# Patient Record
Sex: Female | Born: 1945 | Race: Black or African American | Hispanic: No | Marital: Married | Smoking: Never smoker
Health system: Southern US, Community
[De-identification: ages and names within clinical notes are randomized; demographics above are authoritative.]

## PROBLEM LIST (undated history)

## (undated) DIAGNOSIS — I639 Cerebral infarction, unspecified: Secondary | ICD-10-CM

## (undated) DIAGNOSIS — I10 Essential (primary) hypertension: Secondary | ICD-10-CM

## (undated) DIAGNOSIS — E785 Hyperlipidemia, unspecified: Secondary | ICD-10-CM

## (undated) DIAGNOSIS — E213 Hyperparathyroidism, unspecified: Secondary | ICD-10-CM

## (undated) DIAGNOSIS — F0390 Unspecified dementia without behavioral disturbance: Secondary | ICD-10-CM

## (undated) HISTORY — DX: Unspecified dementia without behavioral disturbance: F03.90

## (undated) HISTORY — PX: CATARACT EXTRACTION: SUR2

## (undated) HISTORY — DX: Essential (primary) hypertension: I10

## (undated) HISTORY — DX: Hyperlipidemia, unspecified: E78.5

## (undated) HISTORY — DX: Hyperparathyroidism, unspecified: E21.3

---

## 1989-07-06 HISTORY — PX: ANKLE SURGERY: SHX546

## 2004-05-20 LAB — HM COLONOSCOPY: HM Colonoscopy: NORMAL

## 2005-11-23 ENCOUNTER — Encounter: Payer: Self-pay | Admitting: Internal Medicine

## 2006-04-05 ENCOUNTER — Ambulatory Visit: Payer: Self-pay | Admitting: Internal Medicine

## 2006-05-17 ENCOUNTER — Ambulatory Visit: Payer: Self-pay | Admitting: Internal Medicine

## 2006-10-08 ENCOUNTER — Ambulatory Visit: Payer: Self-pay | Admitting: Internal Medicine

## 2006-10-08 LAB — CONVERTED CEMR LAB
AST: 27 units/L (ref 0–37)
Albumin: 3.7 g/dL (ref 3.5–5.2)
Alkaline Phosphatase: 48 units/L (ref 39–117)
HDL: 56.1 mg/dL (ref >39.0)
Microalb Creat Ratio: 64.6 mg/g — ABNORMAL HIGH (ref 0.0–30.0)
Total CHOL/HDL Ratio: 2.3

## 2007-05-27 ENCOUNTER — Telehealth: Payer: Self-pay | Admitting: Internal Medicine

## 2007-06-06 ENCOUNTER — Ambulatory Visit: Payer: Self-pay | Admitting: Internal Medicine

## 2007-06-06 LAB — CONVERTED CEMR LAB
ALT: 17 units/L (ref 0–35)
Albumin: 4 g/dL (ref 3.5–5.2)
Alkaline Phosphatase: 48 units/L (ref 39–117)
BUN: 10 mg/dL (ref 6–23)
Bilirubin Urine: NEGATIVE
Calcium: 10.1 mg/dL (ref 8.4–10.5)
Creatinine, Ser: 0.9 mg/dL (ref 0.4–1.2)
Glucose, Bld: 92 mg/dL (ref 70–99)
HCT: 40.1 % (ref 36.0–46.0)
HDL: 54.8 mg/dL (ref >39.0)
Hgb A1c MFr Bld: 5.6 % (ref 4.6–6.0)
Ketones, ur: NEGATIVE mg/dL
LDL Cholesterol: 87 mg/dL (ref 0–99)
MCHC: 33.8 g/dL (ref 30.0–36.0)
Neutrophils Relative %: 55 % (ref 43.0–77.0)
Nitrite: NEGATIVE
Potassium: 3.6 meq/L (ref 3.5–5.1)
TSH: 1.03 microintl units/mL (ref 0.35–5.50)
Total Bilirubin: 1.3 mg/dL — ABNORMAL HIGH (ref 0.3–1.2)
Total Protein: 7.3 g/dL (ref 6.0–8.3)
Triglycerides: 63 mg/dL (ref 0–149)
Urine Glucose: NEGATIVE mg/dL
VLDL: 13 mg/dL (ref 0–40)

## 2007-06-09 ENCOUNTER — Encounter: Payer: Self-pay | Admitting: Internal Medicine

## 2007-06-09 DIAGNOSIS — E785 Hyperlipidemia, unspecified: Secondary | ICD-10-CM | POA: Insufficient documentation

## 2007-06-09 DIAGNOSIS — I10 Essential (primary) hypertension: Secondary | ICD-10-CM

## 2007-06-10 ENCOUNTER — Ambulatory Visit: Payer: Self-pay | Admitting: Internal Medicine

## 2007-06-10 DIAGNOSIS — M79609 Pain in unspecified limb: Secondary | ICD-10-CM

## 2007-06-20 ENCOUNTER — Ambulatory Visit: Payer: Self-pay | Admitting: Internal Medicine

## 2007-06-20 ENCOUNTER — Encounter: Payer: Self-pay | Admitting: Internal Medicine

## 2007-07-26 ENCOUNTER — Encounter: Payer: Self-pay | Admitting: Internal Medicine

## 2007-10-24 ENCOUNTER — Ambulatory Visit: Payer: Self-pay | Admitting: Internal Medicine

## 2007-12-07 ENCOUNTER — Ambulatory Visit: Payer: Self-pay | Admitting: Internal Medicine

## 2007-12-07 DIAGNOSIS — R413 Other amnesia: Secondary | ICD-10-CM

## 2007-12-07 DIAGNOSIS — H43399 Other vitreous opacities, unspecified eye: Secondary | ICD-10-CM | POA: Insufficient documentation

## 2008-05-23 ENCOUNTER — Encounter: Payer: Self-pay | Admitting: Internal Medicine

## 2008-07-09 ENCOUNTER — Telehealth (INDEPENDENT_AMBULATORY_CARE_PROVIDER_SITE_OTHER): Payer: Self-pay | Admitting: *Deleted

## 2008-08-03 ENCOUNTER — Ambulatory Visit: Payer: Self-pay | Admitting: Internal Medicine

## 2008-08-03 LAB — CONVERTED CEMR LAB
BUN: 10 mg/dL (ref 6–23)
CO2: 33 meq/L — ABNORMAL HIGH (ref 19–32)
Creatinine, Ser: 1 mg/dL (ref 0.4–1.2)
Creatinine,U: 135.4 mg/dL
GFR calc Af Amer: 72 mL/min
GFR calc non Af Amer: 60 mL/min
Microalb Creat Ratio: 5.9 mg/g (ref 0.0–30.0)

## 2008-08-07 ENCOUNTER — Ambulatory Visit: Payer: Self-pay | Admitting: Internal Medicine

## 2008-08-07 DIAGNOSIS — M949 Disorder of cartilage, unspecified: Secondary | ICD-10-CM

## 2008-08-07 DIAGNOSIS — M899 Disorder of bone, unspecified: Secondary | ICD-10-CM | POA: Insufficient documentation

## 2008-08-07 LAB — HM DIABETES FOOT EXAM

## 2008-09-14 ENCOUNTER — Ambulatory Visit: Payer: Self-pay | Admitting: *Deleted

## 2008-09-14 DIAGNOSIS — S139XXA Sprain of joints and ligaments of unspecified parts of neck, initial encounter: Secondary | ICD-10-CM | POA: Insufficient documentation

## 2009-01-30 ENCOUNTER — Ambulatory Visit: Payer: Self-pay | Admitting: Internal Medicine

## 2009-02-04 ENCOUNTER — Ambulatory Visit: Payer: Self-pay | Admitting: Internal Medicine

## 2009-02-04 DIAGNOSIS — E119 Type 2 diabetes mellitus without complications: Secondary | ICD-10-CM

## 2009-02-27 LAB — CONVERTED CEMR LAB
ALT: 32 units/L (ref 0–35)
CO2: 27 meq/L (ref 19–32)
Chloride: 104 meq/L (ref 96–112)
Cholesterol: 184 mg/dL (ref 0–200)
Creatinine, Ser: 0.96 mg/dL (ref 0.40–1.20)
Glucose, Bld: 112 mg/dL — ABNORMAL HIGH (ref 70–99)
HDL: 73 mg/dL (ref >39)
Hgb A1c MFr Bld: 5.6 % (ref 4.6–6.1)
Microalb, Ur: 1.16 mg/dL (ref 0.00–1.89)
Sodium: 145 meq/L (ref 135–145)
Total CHOL/HDL Ratio: 2.5
Vit D, 1,25-Dihydroxy: 49 (ref 30–89)

## 2009-08-07 ENCOUNTER — Ambulatory Visit: Payer: Self-pay | Admitting: Family

## 2009-08-07 DIAGNOSIS — R5383 Other fatigue: Secondary | ICD-10-CM

## 2009-08-07 DIAGNOSIS — R5381 Other malaise: Secondary | ICD-10-CM

## 2009-08-26 ENCOUNTER — Telehealth: Payer: Self-pay | Admitting: Internal Medicine

## 2009-09-04 ENCOUNTER — Telehealth: Payer: Self-pay | Admitting: Internal Medicine

## 2009-09-06 ENCOUNTER — Telehealth: Payer: Self-pay | Admitting: Internal Medicine

## 2009-09-10 ENCOUNTER — Ambulatory Visit: Payer: Self-pay | Admitting: Internal Medicine

## 2009-09-10 DIAGNOSIS — F430 Acute stress reaction: Secondary | ICD-10-CM | POA: Insufficient documentation

## 2009-11-11 ENCOUNTER — Ambulatory Visit: Payer: Self-pay | Admitting: Internal Medicine

## 2009-11-11 ENCOUNTER — Encounter: Payer: Self-pay | Admitting: Family

## 2009-11-11 LAB — CONVERTED CEMR LAB: Vitamin B-12: 720 pg/mL (ref 211–911)

## 2009-11-12 ENCOUNTER — Encounter: Payer: Self-pay | Admitting: Family

## 2009-12-17 ENCOUNTER — Ambulatory Visit: Payer: Self-pay | Admitting: Family

## 2009-12-17 ENCOUNTER — Ambulatory Visit: Payer: Self-pay | Admitting: Radiology

## 2009-12-17 ENCOUNTER — Ambulatory Visit (HOSPITAL_BASED_OUTPATIENT_CLINIC_OR_DEPARTMENT_OTHER): Admission: RE | Admit: 2009-12-17 | Discharge: 2009-12-17 | Payer: Self-pay | Admitting: Internal Medicine

## 2009-12-17 DIAGNOSIS — M129 Arthropathy, unspecified: Secondary | ICD-10-CM | POA: Insufficient documentation

## 2009-12-17 LAB — CONVERTED CEMR LAB
ALT: 23 units/L (ref 0–35)
CO2: 28 meq/L (ref 19–32)
HDL: 60 mg/dL (ref >39)
Potassium: 4.4 meq/L (ref 3.5–5.3)
Sodium: 141 meq/L (ref 135–145)
Total Bilirubin: 0.9 mg/dL (ref 0.3–1.2)
Total CHOL/HDL Ratio: 3.3
Uric Acid, Serum: 7.3 mg/dL — ABNORMAL HIGH (ref 2.4–7.0)
VLDL: 18 mg/dL (ref 0–40)

## 2009-12-18 ENCOUNTER — Telehealth: Payer: Self-pay | Admitting: Family

## 2009-12-18 DIAGNOSIS — M109 Gout, unspecified: Secondary | ICD-10-CM | POA: Insufficient documentation

## 2010-01-20 ENCOUNTER — Telehealth: Payer: Self-pay | Admitting: Family

## 2010-04-03 ENCOUNTER — Ambulatory Visit: Payer: Self-pay | Admitting: Internal Medicine

## 2010-04-03 LAB — CONVERTED CEMR LAB
Calcium, Total (PTH): 10.9 mg/dL — ABNORMAL HIGH (ref 8.4–10.5)
Hgb A1c MFr Bld: 6.6 % — ABNORMAL HIGH (ref <5.7)
Potassium: 3.9 meq/L (ref 3.5–5.3)
TSH: 1.319 microintl units/mL (ref 0.350–4.500)

## 2010-04-08 ENCOUNTER — Telehealth: Payer: Self-pay | Admitting: Internal Medicine

## 2010-04-11 ENCOUNTER — Encounter: Payer: Self-pay | Admitting: Internal Medicine

## 2010-08-05 NOTE — Progress Notes (Signed)
Summary: Lab Results  Phone Note Outgoing Call   Summary of Call: Pls call patient and let her know that her uric acid level is high.  I suspect that the pain in her toe is related to gout.  I would like her to start allopurinol.  This should help prevent flare ups.  Also, diabetic labs are higher than last time.  She should work hard on diet and exercise.  Eat small portions of whole grained carbohydrates and avoid concentrated sweets (soda etc.)  X-ray shows hardware is intact.  Pain is likely due to arthritis. She can use Tylenol as needed for pain. Calcium is high- pls ask if she is taking a calcium supplement. If so she should stop calcium. Patient should follow up in 16month please. Initial call taken by: Lemont Fillers FNP,  December 18, 2009 9:07 AM  Follow-up for Phone Call        patient  has been advised per [facility] Craze instructions Follow-up by: Glendell Docker CMA,  December 18, 2009 10:28 AM  New Problems: GOUT (ICD-274.9) HYPERCALCEMIA (ICD-275.42)   New Problems: GOUT (ICD-274.9) HYPERCALCEMIA (ICD-275.42) New/Updated Medications: ALLOPURINOL 100 MG TABS (ALLOPURINOL) one tablet by mouth daily for 1 week, then increase to two tablets by mouth daily Prescriptions: ALLOPURINOL 100 MG TABS (ALLOPURINOL) one tablet by mouth daily for 1 week, then increase to two tablets by mouth daily  #60 x 1   Entered and Authorized by:   Lemont Fillers FNP   Signed by:   Lemont Fillers FNP on 12/18/2009   Method used:   Electronically to        [facility]* (retail)       [address] [place], [place]  [address]       Ph: [phone]       Fax: [phone]   RxID:   [id number]
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
## 2010-08-05 NOTE — Assessment & Plan Note (Signed)
Summary: fu meds/dt   Vital Signs:  Patient profile:   64 year old female Height:      60 inches Weight:      136.25 pounds BMI:     26.71 Temp:     97.9 degrees F oral Pulse rate:   68 / minute Pulse rhythm:   regular Resp:     18 per minute BP sitting:   137 / 79  (left arm) Cuff size:   regular  Vitals Entered By: Glendell Docker CMA (April 03, 2010 9:47 AM) CC: follow-up visit Is Patient Diabetic? No Pain Assessment Patient in pain? no      Comments discuss stress related concerns, refill on all meds to [facility]   Primary Care Provider:  Dondra Spry DO  CC:  follow-up visit.  History of Present Illness: 65 y/o AA female for f/u ongoing stress at work confrontations with her supervisor she has more experience than her supervisor supervisor wants to change counseling function from multifunction to just mental health  provost changing curriculum to more liberal arts [employer] more known for nursing [employer] program - targeting minority students    Preventive Screening-Counseling & Management  Alcohol-Tobacco     Smoking Status: never  Allergies (verified): No Known Drug Allergies  Past History:  Past Medical History: Diabetes Mellitus Type II Hypertension Hyperlipidemia   History of hypercalcemia - abnormal    Social History: Occupation:works as a Veterinary surgeon at [employer] Never Smoked Alcohol use-no   Married 4 children    Review of Systems       intermittent right ankle pain  Physical Exam  General:  alert, well-developed, and well-nourished.   Lungs:  normal respiratory effort and normal breath sounds.   Heart:  normal rate, regular rhythm, and no gallop.   Extremities:  No lower extremity edema   Impression & Recommendations:  Problem # 1:  HYPERCALCEMIA (ICD-275.42) pt with persistent hypercalcemia.  repeat parathyroid testing Orders: T-Basic Metabolic Panel [id number]) T-TSH [phone]) T-T4, Free ([phone]) T- *  Misc. Laboratory test [phone])  Problem # 2:  STRESS REACTION, ACUTE (ICD-308.9) change citalopram to sertraline  Problem # 3:  HYPERTENSION (ICD-401.9)  Her updated medication list for this problem includes:    Nifedical Xl 30 Mg Tb24 (Nifedipine) .Marland Kitchen... Take 1 tablet by mouth once a day    Diovan Hct 160-12.5 Mg Tabs (Valsartan-hydrochlorothiazide) ..... One by mouth once daily  BP today: 137/79 Prior BP: 128/80 (12/17/2009)  Labs Reviewed: K+: 4.4 (12/17/2009) Creat: : 1.01 (12/17/2009)   Chol: 199 (12/17/2009)   HDL: 60 (12/17/2009)   LDL: 121 (12/17/2009)   TG: 92 (12/17/2009)  Complete Medication List: 1)  Lipitor 40 Mg Tabs (Atorvastatin calcium) .... Take 1 tablet by mouth once a day 2)  Nifedical Xl 30 Mg Tb24 (Nifedipine) .... Take 1 tablet by mouth once a day 3)  Onetouch Ultra Test Strp (Glucose blood) .... Test q am ac 4)  Diovan Hct 160-12.5 Mg Tabs (Valsartan-hydrochlorothiazide) .... One by mouth once daily 5)  Low-dose Aspirin 81 Mg Tabs (Aspirin) .... Take 1 tablet by mouth once a day 6)  Multivitamins Tabs (Multiple vitamin) .... Take 1 tablet by mouth once a day 7)  Optive Sensitive 0.5-0.9 % Soln (Carboxymethylcellul-glycerin) .Marland Kitchen.. 1 drop .two times a day each eye 8)  Allopurinol 100 Mg Tabs (Allopurinol) .... One tablet by mouth daily for 1 week, then increase to two tablets by mouth daily 9)  Sertraline Hcl 25 Mg Tabs (Sertraline hcl) .Marland KitchenMarland KitchenMarland Kitchen  1/2 by mouth once daily x 7 days, then one by mouth once daily  Other Orders: Flu Vaccine 20yrs + ([id number]) Admin 1st Vaccine (29528) T- Hemoglobin A1C ([id number]) T-Uric Acid (Blood) ([id number])  Patient Instructions: 1)  Please schedule a follow-up appointment in 2 months. Prescriptions: SERTRALINE HCL 25 MG TABS (SERTRALINE HCL) 1/2 by mouth once daily x 7 days, then one by mouth once daily  #30 x 2   Entered and Authorized by:   D. [name] DO   Signed by:   D. [name] DO on 04/03/2010   Method used:    Electronically to        [facility]* (retail)       [address] [place], [place]  [address]       Ph: [phone]       Fax: [phone]   RxID:   [id number]    Immunizations Administered:  Influenza Vaccine # 1:    Vaccine Type: Fluvax 3+    Site: left deltoid    Mfr: GlaxoSmithKline    Dose: 0.5 ml    Route: IM    Given by: Glendell Docker CMA    Exp. Date: 01/03/2011    Lot #: [id number]    VIS given: 01/28/10 version given April 03, 2010.  Flu Vaccine Consent Questions:    Do you have a history of severe allergic reactions to this vaccine? no    Any prior history of allergic reactions to egg and/or gelatin? no    Do you have a sensitivity to the preservative Thimersol? no    Do you have a past history of Guillan-Barre Syndrome? no    Do you currently have an acute febrile illness? no    Have you ever had a severe reaction to latex? no    Vaccine information given and explained to patient? yes    Are you currently pregnant? no   Current Allergies (reviewed today): No known allergies

## 2010-08-05 NOTE — Progress Notes (Signed)
Summary: Med request and transfer back to prior pharmacy.     Phone Note Call from Patient   Caller: Patient Summary of Call: PT. called and stated that she had spoken with Darlene about getting her meds trasferred somewhere new & pt. states that Darlene done her part, but pt. did not do her part.... Pt. states that she has had alot going on and appreciates Darlene's efforts but needs her meds transferred back to [facility] in [place] since it is closest and easier for her. Call pt. back at w- [phone] or [phone] Initial call taken by: Michaelle Copas,  August 26, 2009 9:04 AM  Follow-up for Phone Call        Prescription for Diovan received electronically and refilled to pharmacy. Follow-up by: Pearletha Furl CMA,  August 26, 2009 11:47 AM
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
## 2010-08-05 NOTE — Progress Notes (Signed)
Summary: med question   Phone Note Call from Patient   Summary of Call: pt. called and states that Dr.Hayden Kihara called her in a Rx. this week but Dr.Clarrisa Kaylor did not put any refills on it & pt. was wondering why they do not have refills.... Does she need to make a ov appt. just so she will have refills? Pt. states she was just here this past month. Call her back and let her know what she needs to do have refills on prescriptions as they were? Call [phone]  Follow-up for Phone Call        she is due for 6 month visit Follow-up by: D. [name] DO,  September 06, 2009 12:10 PM  Additional Follow-up for Phone Call Additional follow up Details #1::        spoke with pt's son and left mess. with  him that his mother needs a 6 mo. f/u in order to get Rx. refills.  Additional Follow-up by: Michaelle Copas,  September 09, 2009 3:20 PM

## 2010-08-05 NOTE — Consult Note (Signed)
Summary: [facility]  [facility]   Imported By: Lanelle Bal 04/28/2010 10:06:01  _____________________________________________________________________  External Attachment:    Type:   Image     Comment:   External Document

## 2010-08-05 NOTE — Assessment & Plan Note (Signed)
Summary: Flu like symptoms/very tired /hea   Vital Signs:  Patient profile:   65 year old female Weight:      131 pounds Temp:     98.1 degrees F oral BP sitting:   124 / 76  (right arm)  Vitals Entered By: Doristine Devoid (August 07, 2009 1:48 PM) CC: fatigue and bodyache xthis morning pt also states she has been under a lot of stress lately   Primary Care Provider:  D. [name] DO  CC:  fatigue and bodyache xthis morning pt also states she has been under a lot of stress lately.  History of Present Illness: Lisa Shaffer is a 65 year old female who presents with c/o fatigue/body aches for several days.  Notes that she has had a lot of of stress at work.  Patient denies fever.  Denies cough.  Denies nausea, vomitting, diarrhea.  Denies sore throat.  Has not used OTC meds. Did have a flu shot this year.  Allergies: No Known Drug Allergies  Physical Exam  General:  Well-developed,well-nourished,in no acute distress; alert,appropriate and cooperative throughout examination Head:  Normocephalic and atraumatic without obvious abnormalities. No apparent alopecia or balding. Eyes:  PERRLA Ears:  External ear exam shows no significant lesions or deformities.  Otoscopic examination reveals clear canals, tympanic membranes are intact bilaterally without bulging, retraction, inflammation or discharge. Hearing is grossly normal bilaterally. Lungs:  Normal respiratory effort, chest expands symmetrically. Lungs are clear to auscultation, no crackles or wheezes. Heart:  Normal rate and regular rhythm. S1 and S2 normal without gallop, murmur, click, rub or other extra sounds. Abdomen:  Bowel sounds positive,abdomen soft and non-tender without masses, organomegaly or hernias noted.   Impression & Recommendations:  Problem # 1:  MALAISE AND FATIGUE (ICD-780.79) Assessment New Long conversation with patient related to her work environment and it's current stresses.  I suspect that stress is playing  a role here.  It is also possible that patient may have a mild viral illness as well.  No clinical indication for abx at this time.  Recommended, rest, ibuprofen as needed, fluids- patient to call is fever, if symptoms worsen, or if they do not improve.  Complete Medication List: 1)  Lipitor 40 Mg Tabs (Atorvastatin calcium) .... Take 1 tablet by mouth once a day 2)  Nifedical Xl 30 Mg Tb24 (Nifedipine) .... Take 1 tablet by mouth once a day 3)  Onetouch Ultra Test Strp (Glucose blood) .... Test q am ac 4)  Diovan Hct 80-12.5 Mg Tabs (Valsartan-hydrochlorothiazide) .... Take 1 tablet by mouth once a day 5)  Low-dose Aspirin 81 Mg Tabs (Aspirin) .... Take 1 tablet by mouth once a day 6)  Multivitamins Tabs (Multiple vitamin) .... Take 1 tablet by mouth once a day 7)  Optive Sensitive 0.5-0.9 % Soln (Carboxymethylcellul-glycerin) .Marland Kitchen.. 1 drop .two times a day each eye  Patient Instructions: 1)  Take 400-600mg  of Ibuprofen (Advil, Motrin) with food every 4-6 hours as needed for relief of pain or comfort of fever. 2)  Call if symptoms worsen or do not improve, or if you develop fever over 101

## 2010-08-05 NOTE — Progress Notes (Signed)
Summary: Lab Results  Phone Note Outgoing Call   Summary of Call: call pt - high calcium may be due to hyperparathyroidism.  I suggest endocrine consult Initial call taken by: D. [name] DO,  April 08, 2010 5:18 PM  Follow-up for Phone Call        call placed to patient at [phone] , voice message reached stating the mailbox is unable to accept messages at this time, please try your call again later. Attempted to contact patient at [phone], voice recording reached stating subscriber is unavailable. Follow-up by: Glendell Docker CMA,  April 09, 2010 8:59 AM  Additional Follow-up for Phone Call Additional follow up Details #1::        call placed to patient at [phone], she has been advised per Dr Artist Pais instructions. She states her appointment is scheduled for Friday 10/7. She is also requesting refill for Diovann and Nifidipine. She was informed, rx sent to pharmacy. If additional rx's were needed she was advised to have the pharmacy contact us. Patient verbalized understanding and agrees Additional Follow-up by: Glendell Docker CMA,  April 10, 2010 8:50 AM    Prescriptions: DIOVAN HCT 160-12.5 MG TABS (VALSARTAN-HYDROCHLOROTHIAZIDE) one by mouth once daily  #30 x 3   Entered by:   Glendell Docker CMA   Authorized by:   D. [name] DO   Signed by:   Glendell Docker CMA on 04/10/2010   Method used:   Electronically to        [facility]* (retail)       [address] [place], [place]  [address]       Ph: [phone]       Fax: [phone]   RxID:   [id number] NIFEDICAL XL 30 MG TB24 (NIFEDIPINE) Take 1 tablet by mouth once a day  #30 x 3   Entered by:   Glendell Docker CMA   Authorized by:   D. [name] DO   Signed by:   Glendell Docker CMA on 04/10/2010   Method used:   Electronically to        [facility]* (retail)       [address] [place], [place]  [address]       Ph: [phone]       Fax: [phone]   RxID:   [id number]
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
## 2010-08-05 NOTE — Progress Notes (Signed)
Summary: needs follow up--Lm 7/18,7/20  Phone Note Outgoing Call   Summary of Call: Please call patient and arrange a follow up visit.  She no-showed today. thanks Initial call taken by: Lemont Fillers FNP,  January 20, 2010 4:43 PM  Follow-up for Phone Call        Attempted to contact pt to reschedule follow up appt. Voicemail is unable to accept messages.  Nicki Guadalajara Fergerson CMA Duncan Dull)  January 20, 2010 5:01 PM   Unable to leave message on home number. Unable to leave message on cell [phone]. Left message to return my call on work #.  Nicki Guadalajara Fergerson CMA Duncan Dull)  January 22, 2010 11:03 AM

## 2010-08-05 NOTE — Progress Notes (Signed)
Summary: refill request  Phone Note Refill Request Call back at [phone] Message from:  Patient on September 04, 2009 4:02 PM  Refills Requested: Medication #1:  LIPITOR 40 MG TABS Take 1 tablet by mouth once a day   Dosage confirmed as above?Dosage Confirmed   Brand Name Necessary? No   Supply Requested: 1 month  Medication #2:  ONETOUCH ULTRA TEST  STRP test q am ac   Dosage confirmed as above?Dosage Confirmed   Brand Name Necessary? No   Supply Requested: 1 month  Method Requested: Electronic Next Appointment Scheduled: none Initial call taken by: Roselle Locus,  September 04, 2009 4:03 PM  Follow-up for Phone Call        Rx completed in Dr. Tiajuana Amass Follow-up by: Glendell Docker CMA,  September 05, 2009 11:20 AM    Prescriptions: LIPITOR 40 MG TABS (ATORVASTATIN CALCIUM) Take 1 tablet by mouth once a day  #30 x 0   Entered by:   Glendell Docker CMA   Authorized by:   D. [name] DO   Signed by:   Glendell Docker CMA on 09/05/2009   Method used:   Electronically to        [facility]* (retail)       [address] [place], [place]  [address]       Ph: [phone]       Fax: [phone]   RxID:   [id number] [place] TEST  STRP (GLUCOSE BLOOD) test q am ac  #30 x 0   Entered by:   Glendell Docker CMA   Authorized by:   D. [name] DO   Signed by:   Glendell Docker CMA on 09/05/2009   Method used:   Electronically to        [facility]* (retail)       [address] [place], [place]  [address]       Ph: [phone]       Fax: [phone]   RxID:   [id number]

## 2010-08-05 NOTE — Letter (Signed)
   [place] [facility] [address] [place], [place]  [address]  [place] Phone: [phone]      Nov 12, 2009   Lisa Shaffer [address] [place], [place] [address]  RE:  LAB RESULTS  Dear  Ms. Landsberg,  The following is an interpretation of your most recent lab tests.  Please take note of any instructions provided or changes to medications that have resulted from your lab work.   THYROID STUDIES:  Thyroid studies normal TSH: 1.275     B12 level, Folate and screening for syphillis are all normal.     Sincerely Yours,    Lemont Fillers FNP

## 2010-08-08 ENCOUNTER — Encounter: Payer: Self-pay | Admitting: Internal Medicine

## 2010-08-27 NOTE — Consult Note (Signed)
Summary: [facility]  [facility]   Imported By: Maryln Gottron 08/22/2010 10:19:10  _____________________________________________________________________  External Attachment:    Type:   Image     Comment:   External Document

## 2010-10-02 ENCOUNTER — Telehealth: Payer: Self-pay | Admitting: Internal Medicine

## 2010-10-02 MED ORDER — VALSARTAN-HYDROCHLOROTHIAZIDE 160-12.5 MG PO TABS: 1.0000 | ORAL_TABLET | Freq: Every day | ORAL | Status: DC

## 2010-10-02 MED ORDER — NIFEDIPINE 30 MG (OSM) PO TB24: 30.0000 mg | ORAL_TABLET | Freq: Every day | ORAL | Status: DC

## 2010-10-02 NOTE — Telephone Encounter (Signed)
Pharmacy in [place] is temporary pharmacy.   Refill- nifedical xl 30mg  tablets. Take 1 tablet by mouth every day. Qty 30. Last fill 2.9.12  Refill- diovan hct 160mg /12.5mg  tablets. Take 1 tablet by mouth every day. Qty 30. Last fill 2.15.12

## 2010-11-04 ENCOUNTER — Other Ambulatory Visit: Payer: Self-pay | Admitting: Family

## 2010-11-20 ENCOUNTER — Telehealth: Payer: Self-pay | Admitting: *Deleted

## 2010-11-20 NOTE — Telephone Encounter (Signed)
Pt states Lisa Shaffer @ [facility] @ [phone] was supposed to have faxed [place] a letter of medication recommendations. Pt is calling to see if we received it. Per pt it is ok to leave detailed message on cell phone.
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
## 2010-11-25 MED ORDER — ALPRAZOLAM 0.25 MG PO TABS: 0.2500 mg | ORAL_TABLET | Freq: Every day | ORAL | Status: DC | PRN

## 2010-11-25 MED ORDER — SERTRALINE HCL 25 MG PO TABS: ORAL_TABLET | ORAL | Status: DC

## 2010-11-25 NOTE — Telephone Encounter (Signed)
Kellie Moor returned phone call ,he stated that patient is under a lot of stress and would benefit from a anti-dpressant/anti-anxiety medication.  He states that his office note has not been sent.

## 2010-11-25 NOTE — Telephone Encounter (Signed)
Call placed to patient at  [phone]. She was asked for clarification on her request. She stated that she sought out care last week to someone Dr Artist Pais recommended,that might be helpful with her anxiety. She states she was seen by a psychologist Kellie Moor with  [facility] [phone] last week. She stated that he could not prescribe medication, and made a recommendation on medication that may be helpful to her. She was following up to see if the office notes had been faxed to our office.   Call placed to [facility] [phone], receptionist stated Kellie Moor was not available, sent to voice mail. A detailed voice message was left , informing of patient inquiry on office notes and medication status. Office number and fax was left on voice message .

## 2010-11-25 NOTE — Telephone Encounter (Signed)
See rx for sertraline.  plz call in alprazolam to pharm Pt needs follow up office visit within 1 month of starting medication plz advise pt to call if anxiety symptoms get worse

## 2010-11-26 NOTE — Telephone Encounter (Signed)
Call placed to patient at [phone], she was advised per Dr Artist Pais instructions. Call placed to [facility] [phone], Rx for Alprazolam 0.25 qty 30 NR, daily for sleep/anxiety called into the pharmacist [facility].

## 2010-12-02 ENCOUNTER — Encounter: Payer: Self-pay | Admitting: Family

## 2010-12-02 ENCOUNTER — Ambulatory Visit (INDEPENDENT_AMBULATORY_CARE_PROVIDER_SITE_OTHER): Payer: BC Managed Care – PPO | Admitting: Family

## 2010-12-02 VITALS — BP 120/82 | HR 84 | Temp 98.2°F | Resp 16 | Wt 136.0 lb

## 2010-12-02 DIAGNOSIS — M25476 Effusion, unspecified foot: Secondary | ICD-10-CM

## 2010-12-02 DIAGNOSIS — M25471 Effusion, right ankle: Secondary | ICD-10-CM

## 2010-12-02 DIAGNOSIS — M2550 Pain in unspecified joint: Secondary | ICD-10-CM

## 2010-12-02 DIAGNOSIS — G8929 Other chronic pain: Secondary | ICD-10-CM | POA: Insufficient documentation

## 2010-12-02 DIAGNOSIS — M25473 Effusion, unspecified ankle: Secondary | ICD-10-CM

## 2010-12-02 LAB — CBC WITH DIFFERENTIAL/PLATELET
Basophils Absolute: 0 10*3/uL (ref 0.0–0.1)
Basophils Relative: 1 % (ref 0–1)
Eosinophils Absolute: 0.1 10*3/uL (ref 0.0–0.7)
Eosinophils Relative: 1 % (ref 0–5)
HCT: 41.6 % (ref 36.0–46.0)
Lymphs Abs: 2.3 10*3/uL (ref 0.7–4.0)
MCHC: 33.7 g/dL (ref 30.0–36.0)
MCV: 80.6 fL (ref 78.0–100.0)
Monocytes Absolute: 0.3 10*3/uL (ref 0.1–1.0)
Neutro Abs: 2.6 10*3/uL (ref 1.7–7.7)
Neutrophils Relative %: 49 % (ref 43–77)
Platelets: 217 10*3/uL (ref 150–400)
RDW: 14.4 % (ref 11.5–15.5)
WBC: 5.3 10*3/uL (ref 4.0–10.5)

## 2010-12-02 LAB — URIC ACID: Uric Acid, Serum: 6.9 mg/dL (ref 2.4–7.0)
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
## 2010-12-02 NOTE — Assessment & Plan Note (Signed)
Differential includes acute gout exacerbation and infected hardware.  Currently afebrile.  Will check CBC, uric acid, blood culture and x-ray.  Will determine further treatment based on findings.

## 2010-12-02 NOTE — Patient Instructions (Signed)
Please complete your blood work on the first floor.  We will contact you with the results.

## 2010-12-03 ENCOUNTER — Telehealth: Payer: Self-pay | Admitting: Internal Medicine

## 2010-12-03 ENCOUNTER — Telehealth: Payer: Self-pay | Admitting: Family

## 2010-12-03 ENCOUNTER — Ambulatory Visit (HOSPITAL_BASED_OUTPATIENT_CLINIC_OR_DEPARTMENT_OTHER)
Admission: RE | Admit: 2010-12-03 | Discharge: 2010-12-03 | Disposition: A | Discharge status: Home / Self Care | Payer: BC Managed Care – PPO | Source: Ambulatory Visit | Attending: Family | Admitting: Family

## 2010-12-03 DIAGNOSIS — R609 Edema, unspecified: Secondary | ICD-10-CM

## 2010-12-03 DIAGNOSIS — M2550 Pain in unspecified joint: Secondary | ICD-10-CM

## 2010-12-03 DIAGNOSIS — L539 Erythematous condition, unspecified: Secondary | ICD-10-CM

## 2010-12-03 DIAGNOSIS — M109 Gout, unspecified: Secondary | ICD-10-CM | POA: Insufficient documentation

## 2010-12-03 MED ORDER — ALLOPURINOL 100 MG PO TABS: 300.0000 mg | ORAL_TABLET | Freq: Every day | ORAL | Status: DC

## 2010-12-03 MED ORDER — COLCHICINE 0.6 MG PO TABS: ORAL_TABLET | ORAL | Status: AC

## 2010-12-03 NOTE — Telephone Encounter (Signed)
Left message for pt to return my call.  When she calls back pls let her know that lab work suggests gout.  I do not see x-ray results (did she complete it?)  I would like her to complete. I have increased her allopurinol from 200mg  daily to 300mg  daily.  Also added colchicine TID for the next 1-2 days to help with her symptoms.

## 2010-12-03 NOTE — Telephone Encounter (Signed)
Pt notified and voices understanding. She will return to the imaging dept for xrays today.

## 2010-12-03 NOTE — Telephone Encounter (Signed)
Pt states that she needs a work note for being seen yesterday and that she had x-rays done today.

## 2010-12-05 NOTE — Telephone Encounter (Signed)
Call placed to patient [phone], she was advised letter would be left at front desk for patient pick up. She states she has a follow up appointment on Monday and will pick up the letter then.

## 2010-12-06 ENCOUNTER — Encounter: Payer: Self-pay | Admitting: Family

## 2010-12-08 LAB — CULTURE, BLOOD (SINGLE): Organism ID, Bacteria: NO GROWTH

## 2010-12-09 ENCOUNTER — Ambulatory Visit (INDEPENDENT_AMBULATORY_CARE_PROVIDER_SITE_OTHER): Payer: BC Managed Care – PPO | Admitting: Family

## 2010-12-09 ENCOUNTER — Encounter: Payer: Self-pay | Admitting: Internal Medicine

## 2010-12-09 ENCOUNTER — Encounter: Payer: Self-pay | Admitting: Family

## 2010-12-09 VITALS — BP 110/74 | HR 84 | Temp 98.0°F | Resp 16 | Wt 135.0 lb

## 2010-12-09 DIAGNOSIS — M109 Gout, unspecified: Secondary | ICD-10-CM
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
## 2010-12-09 NOTE — Assessment & Plan Note (Signed)
Clinically improving. We'll have patient taper off of colchicine, and continued increase dose of allopurinol.

## 2010-12-09 NOTE — Patient Instructions (Signed)
Please taper off of the colchicine in the next 2-3 days. Continue allopurinol 300mg  once daily. Call if your develop increased pain, swelling or redness of the right foot. Follow up in 3 months.

## 2010-12-16 ENCOUNTER — Telehealth: Payer: Self-pay | Admitting: Internal Medicine

## 2010-12-16 NOTE — Telephone Encounter (Signed)
Refill- diovan hct 160mg /12.5mg . Qty 30. 1 QD  Refill- nifedical xl 30mg . Qty 30. 1QD  Requests sent electronically and failed

## 2010-12-17 MED ORDER — VALSARTAN-HYDROCHLOROTHIAZIDE 160-12.5 MG PO TABS: 1.0000 | ORAL_TABLET | Freq: Every day | ORAL | Status: DC

## 2010-12-17 MED ORDER — NIFEDIPINE 30 MG (OSM) PO TB24: 30.0000 mg | ORAL_TABLET | Freq: Every day | ORAL | Status: DC

## 2010-12-17 NOTE — Telephone Encounter (Signed)
Rx refill sent to pharmacy. 

## 2011-01-29 ENCOUNTER — Telehealth: Payer: Self-pay | Admitting: Internal Medicine

## 2011-01-29 MED ORDER — ATORVASTATIN CALCIUM 40 MG PO TABS: 40.0000 mg | ORAL_TABLET | Freq: Every day | ORAL | Status: DC

## 2011-01-29 NOTE — Telephone Encounter (Signed)
Patient knows that she needs to be seen before we can refill lipitor but pt states that she is out and she made a follow up appt with [facility] for 02-03-11. [facility].

## 2011-01-29 NOTE — Telephone Encounter (Signed)
Refill sent to pharmacy for #30 x no refills. Pt aware.

## 2011-02-03 ENCOUNTER — Encounter: Payer: Self-pay | Admitting: Family

## 2011-02-03 ENCOUNTER — Ambulatory Visit (INDEPENDENT_AMBULATORY_CARE_PROVIDER_SITE_OTHER): Payer: BC Managed Care – PPO | Admitting: Family

## 2011-02-03 DIAGNOSIS — I10 Essential (primary) hypertension: Secondary | ICD-10-CM

## 2011-02-03 DIAGNOSIS — E785 Hyperlipidemia, unspecified: Secondary | ICD-10-CM

## 2011-02-03 DIAGNOSIS — E119 Type 2 diabetes mellitus without complications: Secondary | ICD-10-CM

## 2011-02-03 DIAGNOSIS — R7309 Other abnormal glucose: Secondary | ICD-10-CM

## 2011-02-03 DIAGNOSIS — M109 Gout, unspecified: Secondary | ICD-10-CM

## 2011-02-03 LAB — BASIC METABOLIC PANEL
CO2: 25 mEq/L (ref 19–32)
Potassium: 4.1 mEq/L (ref 3.5–5.3)

## 2011-02-03 LAB — HEPATIC FUNCTION PANEL: Bilirubin, Direct: 0.1 mg/dL (ref 0.0–0.3)

## 2011-02-03 MED ORDER — NIFEDIPINE ER OSMOTIC RELEASE 30 MG PO TB24: 30.0000 mg | ORAL_TABLET | Freq: Every day | ORAL | Status: DC

## 2011-02-03 MED ORDER — ATORVASTATIN CALCIUM 40 MG PO TABS: 40.0000 mg | ORAL_TABLET | Freq: Every day | ORAL | Status: DC

## 2011-02-03 MED ORDER — ALLOPURINOL 100 MG PO TABS: 300.0000 mg | ORAL_TABLET | Freq: Every day | ORAL | Status: DC

## 2011-02-03 MED ORDER — VALSARTAN-HYDROCHLOROTHIAZIDE 160-12.5 MG PO TABS: 1.0000 | ORAL_TABLET | Freq: Every day | ORAL | Status: DC

## 2011-02-03 NOTE — Assessment & Plan Note (Signed)
Stable on nifedical and diovan HCT. Check Bmet

## 2011-02-03 NOTE — Assessment & Plan Note (Signed)
Will check a1C.  Clinically stable.  Pt instructed to contact her eye doctor and arrange a follow up apt.
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
## 2011-02-03 NOTE — Patient Instructions (Addendum)
Complete you lab work on the first floor today. Return fasting later this week for a cholesterol.   Schedule an eye exam with your eye doctor and request that copy of the report be faxed to our office.  Follow up in 3 months.

## 2011-02-03 NOTE — Assessment & Plan Note (Signed)
Clinically stable, continue allopurinol.

## 2011-02-04 ENCOUNTER — Telehealth: Payer: Self-pay | Admitting: Internal Medicine

## 2011-02-04 LAB — MICROALBUMIN / CREATININE URINE RATIO
Creatinine, Urine: 143.7 mg/dL
Microalb Creat Ratio: 10.8 mg/g (ref 0.0–30.0)
Microalb, Ur: 1.55 mg/dL (ref 0.00–1.89)

## 2011-02-04 NOTE — Telephone Encounter (Signed)
Per medication list, pt would need #90 tablets as she currently takes 3 capsules daily. Notified Benny at [employer].

## 2011-02-04 NOTE — Telephone Encounter (Signed)
Allopurinol 100mg  tablets. Take 3 tablets by mouth daily. Qty 30.   Message: please verify quantity is this for #90(1 month) or 30 tabs?

## 2011-02-10 ENCOUNTER — Encounter: Payer: Self-pay | Admitting: Family

## 2011-02-27 ENCOUNTER — Telehealth: Payer: Self-pay | Admitting: *Deleted

## 2011-02-27 DIAGNOSIS — E119 Type 2 diabetes mellitus without complications: Secondary | ICD-10-CM

## 2011-02-27 NOTE — Telephone Encounter (Signed)
Pt would like referral to ophthalmology for diabetic eye exam in the [place] area. Please advise.

## 2011-02-27 NOTE — Telephone Encounter (Signed)
Will refer to [facility] surgeons for exam.

## 2011-03-17 ENCOUNTER — Ambulatory Visit: Payer: BC Managed Care – PPO | Admitting: Internal Medicine

## 2011-05-05 ENCOUNTER — Encounter: Payer: Self-pay | Admitting: Family

## 2011-05-05 ENCOUNTER — Ambulatory Visit (INDEPENDENT_AMBULATORY_CARE_PROVIDER_SITE_OTHER): Payer: Medicare Other | Admitting: Family

## 2011-05-05 VITALS — BP 130/84 | HR 78 | Temp 97.8°F | Resp 16 | Ht 60.0 in | Wt 132.0 lb

## 2011-05-05 DIAGNOSIS — F411 Generalized anxiety disorder: Secondary | ICD-10-CM

## 2011-05-05 DIAGNOSIS — E119 Type 2 diabetes mellitus without complications: Secondary | ICD-10-CM

## 2011-05-05 DIAGNOSIS — R413 Other amnesia: Secondary | ICD-10-CM

## 2011-05-05 DIAGNOSIS — R7309 Other abnormal glucose: Secondary | ICD-10-CM

## 2011-05-05 DIAGNOSIS — F419 Anxiety disorder, unspecified: Secondary | ICD-10-CM

## 2011-05-05 NOTE — Patient Instructions (Signed)
You will be contacted about your referral to the therapist at [facility]. Follow up in 3 months, sooner if problems or concerns.
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
## 2011-05-06 ENCOUNTER — Encounter: Payer: Self-pay | Admitting: Family

## 2011-05-06 LAB — BASIC METABOLIC PANEL
BUN: 17 mg/dL (ref 6–23)
CO2: 28 mEq/L (ref 19–32)
Glucose, Bld: 110 mg/dL — ABNORMAL HIGH (ref 70–99)
Potassium: 4.9 mEq/L (ref 3.5–5.3)

## 2011-05-06 LAB — HEMOGLOBIN A1C: Hgb A1c MFr Bld: 6.6 % — ABNORMAL HIGH (ref <5.7)

## 2011-05-07 ENCOUNTER — Telehealth: Payer: Self-pay | Admitting: Family

## 2011-05-07 DIAGNOSIS — Z13820 Encounter for screening for osteoporosis: Secondary | ICD-10-CM

## 2011-05-07 DIAGNOSIS — F4323 Adjustment disorder with mixed anxiety and depressed mood: Secondary | ICD-10-CM | POA: Insufficient documentation

## 2011-05-07 NOTE — Assessment & Plan Note (Signed)
New.  I believe that this is situational due to her recent job loss.  At this point, I do not think that we need to start any medication, but I do think that she could benefit from seeing a therapist.  She is agreeable to this.  Will refer.  15 minutes spent with patient today.  >50% of this time was spent counseling her on her anxiety.

## 2011-05-07 NOTE — Assessment & Plan Note (Signed)
Check A1C 

## 2011-05-07 NOTE — Telephone Encounter (Signed)
Please call the lab to add on PTH diagnosis is hypercalcemia. If they are unable, please ask pt to return to lab.  Her calcium level is high. Please call pt and let her know I would like for her to have a bone density to evaluate for osteoporosis. Order is in epic, it will need to be scheduled at [facility].

## 2011-05-07 NOTE — Assessment & Plan Note (Signed)
She has completed MMSE with a normal score and had normal B12, folate, and TSH levels in the past which were ordered in evaluation of her memory loss complaints.  I suspect stress/anxiety remain the primary issue.  Monitor.

## 2011-05-08 NOTE — Telephone Encounter (Signed)
Pt notified.  Lab cannot add on PTH. Order has been entered and forwarded to the lab for next week. Pt advised she will be contacted re: bone density in the next few days.

## 2011-05-25 ENCOUNTER — Ambulatory Visit (INDEPENDENT_AMBULATORY_CARE_PROVIDER_SITE_OTHER): Payer: 59 | Admitting: Licensed Clinical Social Worker

## 2011-05-25 DIAGNOSIS — F4323 Adjustment disorder with mixed anxiety and depressed mood: Secondary | ICD-10-CM

## 2011-06-12 ENCOUNTER — Ambulatory Visit (INDEPENDENT_AMBULATORY_CARE_PROVIDER_SITE_OTHER): Payer: 59 | Admitting: Licensed Clinical Social Worker

## 2011-06-12 DIAGNOSIS — F4323 Adjustment disorder with mixed anxiety and depressed mood: Secondary | ICD-10-CM

## 2011-06-22 ENCOUNTER — Telehealth: Payer: Self-pay | Admitting: Family

## 2011-06-22 MED ORDER — GLUCOSE BLOOD VI STRP: ORAL_STRIP | Status: AC

## 2011-06-22 NOTE — Telephone Encounter (Signed)
Pt presented with her husband to his apt and requested new meter for herself.  Pt was given a freestyle freedomlite.

## 2011-07-27 ENCOUNTER — Telehealth: Payer: Self-pay | Admitting: Family

## 2011-07-27 ENCOUNTER — Ambulatory Visit (INDEPENDENT_AMBULATORY_CARE_PROVIDER_SITE_OTHER): Payer: Medicare Other | Admitting: Family

## 2011-07-27 ENCOUNTER — Ambulatory Visit (HOSPITAL_BASED_OUTPATIENT_CLINIC_OR_DEPARTMENT_OTHER)
Admission: RE | Admit: 2011-07-27 | Discharge: 2011-07-27 | Disposition: A | Discharge status: Home / Self Care | Payer: Medicare Other | Source: Ambulatory Visit | Attending: Family | Admitting: Family

## 2011-07-27 ENCOUNTER — Encounter: Payer: Self-pay | Admitting: Family

## 2011-07-27 VITALS — BP 128/78 | HR 66 | Temp 97.8°F | Resp 16 | Wt 128.1 lb

## 2011-07-27 DIAGNOSIS — F4323 Adjustment disorder with mixed anxiety and depressed mood: Secondary | ICD-10-CM

## 2011-07-27 DIAGNOSIS — R413 Other amnesia: Secondary | ICD-10-CM

## 2011-07-27 DIAGNOSIS — M25471 Effusion, right ankle: Secondary | ICD-10-CM

## 2011-07-27 DIAGNOSIS — M7989 Other specified soft tissue disorders: Secondary | ICD-10-CM

## 2011-07-27 DIAGNOSIS — E119 Type 2 diabetes mellitus without complications: Secondary | ICD-10-CM

## 2011-07-27 DIAGNOSIS — Z23 Encounter for immunization: Secondary | ICD-10-CM

## 2011-07-27 DIAGNOSIS — R609 Edema, unspecified: Secondary | ICD-10-CM | POA: Insufficient documentation

## 2011-07-27 DIAGNOSIS — M25473 Effusion, unspecified ankle: Secondary | ICD-10-CM

## 2011-07-27 DIAGNOSIS — M25579 Pain in unspecified ankle and joints of unspecified foot: Secondary | ICD-10-CM | POA: Insufficient documentation

## 2011-07-27 DIAGNOSIS — M25476 Effusion, unspecified foot: Secondary | ICD-10-CM

## 2011-07-27 DIAGNOSIS — E785 Hyperlipidemia, unspecified: Secondary | ICD-10-CM

## 2011-07-27 DIAGNOSIS — R7309 Other abnormal glucose: Secondary | ICD-10-CM

## 2011-07-27 DIAGNOSIS — Z9889 Other specified postprocedural states: Secondary | ICD-10-CM

## 2011-07-27 LAB — BASIC METABOLIC PANEL WITH GFR
BUN: 25 mg/dL — ABNORMAL HIGH (ref 6–23)
Chloride: 108 mEq/L (ref 96–112)
Creat: 1.08 mg/dL (ref 0.50–1.10)
GFR, Est Non African American: 54 mL/min — ABNORMAL LOW
Glucose, Bld: 129 mg/dL — ABNORMAL HIGH (ref 70–99)
Potassium: 4.1 mEq/L (ref 3.5–5.3)

## 2011-07-27 LAB — LIPID PANEL
Total CHOL/HDL Ratio: 4.3 Ratio
VLDL: 15 mg/dL (ref 0–40)

## 2011-07-27 LAB — HEMOGLOBIN A1C
Hgb A1c MFr Bld: 6.8 % — ABNORMAL HIGH (ref <5.7)
Mean Plasma Glucose: 148 mg/dL — ABNORMAL HIGH (ref <117)

## 2011-07-27 LAB — HEPATIC FUNCTION PANEL
Bilirubin, Direct: 0.1 mg/dL (ref 0.0–0.3)
Total Bilirubin: 0.6 mg/dL (ref 0.3–1.2)

## 2011-07-27 MED ORDER — SERTRALINE HCL 50 MG PO TABS: 50.0000 mg | ORAL_TABLET | Freq: Every day | ORAL | Status: DC

## 2011-07-27 NOTE — Patient Instructions (Addendum)
Please complete your lab work prior to leaving.  Complete ankle x-ray on the first floor.  Follow up in 1 month for a fasting medicare wellness exam.

## 2011-07-27 NOTE — Assessment & Plan Note (Signed)
Obtain xray

## 2011-07-27 NOTE — Assessment & Plan Note (Signed)
Obtain A1C. 

## 2011-07-27 NOTE — Telephone Encounter (Signed)
Please call pt and let her know that her x-ray shows arthritis in her ankle. Hardware is intact.  She should use tylenol as needed for pain.

## 2011-07-27 NOTE — Assessment & Plan Note (Signed)
She has completed MMSE previously with a normal score and had normal B12, folate, and TSH levels in the past which were ordered in evaluation of her memory loss complaints. I suspect stress/anxiety remain the primary issue. Will plan to treat anxiety/depression and re-evaluate.

## 2011-07-27 NOTE — Telephone Encounter (Signed)
Pt.notified
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
## 2011-07-27 NOTE — Assessment & Plan Note (Addendum)
Zoloft trial. I instructed pt to start 1/2 tablet once daily for 1 week and then increase to a full tablet once daily on week two as tolerated. .  We discussed common side effects such as nausea, drowsiness and weight gain.  Also discussed rare but serious side effect of suicide ideation.  She is instructed to discontinue medication go directly to ED if this occurs.  Pt verbalizes understanding.  Plan follow up in 1 month to evaluate progress.

## 2011-07-28 ENCOUNTER — Telehealth: Payer: Self-pay | Admitting: Family

## 2011-07-28 ENCOUNTER — Encounter: Payer: Self-pay | Admitting: Family

## 2011-07-28 DIAGNOSIS — E785 Hyperlipidemia, unspecified: Secondary | ICD-10-CM

## 2011-07-28 MED ORDER — ATORVASTATIN CALCIUM 80 MG PO TABS: 80.0000 mg | ORAL_TABLET | Freq: Every day | ORAL | Status: DC

## 2011-07-28 NOTE — Telephone Encounter (Signed)
Pls call pt and let her know that her diabetes appears controlled.  Cholesterol is not at goal.  I would like for her to increase her lipitor.  She will need follow up flp and lft's in 3 months please.

## 2011-07-29 NOTE — Telephone Encounter (Signed)
Notified pt and placed future lab order for the week of 10/26/11. Lab reminder has been mailed to pt.

## 2011-07-31 ENCOUNTER — Ambulatory Visit: Payer: 59 | Admitting: Licensed Clinical Social Worker

## 2011-08-18 ENCOUNTER — Ambulatory Visit (INDEPENDENT_AMBULATORY_CARE_PROVIDER_SITE_OTHER): Payer: Medicare Other | Admitting: Family

## 2011-08-18 ENCOUNTER — Other Ambulatory Visit (HOSPITAL_COMMUNITY)
Admission: RE | Admit: 2011-08-18 | Discharge: 2011-08-18 | Disposition: A | Discharge status: Home / Self Care | Payer: Medicare Other | Source: Ambulatory Visit | Attending: Family | Admitting: Family

## 2011-08-18 ENCOUNTER — Encounter: Payer: Self-pay | Admitting: Family

## 2011-08-18 DIAGNOSIS — Z1231 Encounter for screening mammogram for malignant neoplasm of breast: Secondary | ICD-10-CM

## 2011-08-18 DIAGNOSIS — Z0000 Encounter for general adult medical examination without abnormal findings: Secondary | ICD-10-CM

## 2011-08-18 DIAGNOSIS — Z23 Encounter for immunization: Secondary | ICD-10-CM

## 2011-08-18 DIAGNOSIS — F4323 Adjustment disorder with mixed anxiety and depressed mood: Secondary | ICD-10-CM

## 2011-08-18 DIAGNOSIS — E785 Hyperlipidemia, unspecified: Secondary | ICD-10-CM

## 2011-08-18 DIAGNOSIS — M949 Disorder of cartilage, unspecified: Secondary | ICD-10-CM

## 2011-08-18 DIAGNOSIS — M899 Disorder of bone, unspecified: Secondary | ICD-10-CM

## 2011-08-18 DIAGNOSIS — Z1239 Encounter for other screening for malignant neoplasm of breast: Secondary | ICD-10-CM

## 2011-08-18 DIAGNOSIS — Z0100 Encounter for examination of eyes and vision without abnormal findings: Secondary | ICD-10-CM

## 2011-08-18 DIAGNOSIS — Z01419 Encounter for gynecological examination (general) (routine) without abnormal findings: Secondary | ICD-10-CM | POA: Insufficient documentation

## 2011-08-18 DIAGNOSIS — M8580 Other specified disorders of bone density and structure, unspecified site: Secondary | ICD-10-CM

## 2011-08-18 DIAGNOSIS — M129 Arthropathy, unspecified: Secondary | ICD-10-CM

## 2011-08-18 DIAGNOSIS — R413 Other amnesia: Secondary | ICD-10-CM

## 2011-08-18 DIAGNOSIS — I10 Essential (primary) hypertension: Secondary | ICD-10-CM

## 2011-08-18 NOTE — Assessment & Plan Note (Signed)
I recommended tylenol PRN pain. Avoid NSAIDS.

## 2011-08-18 NOTE — Assessment & Plan Note (Addendum)
Unchanged.  She did not tolerate Zoloft.  Interested in more natural approach. I recommended that she try to arrange follow up with Judithe Modest, her therapist.  She declines any further medications at this time.

## 2011-08-18 NOTE — Assessment & Plan Note (Signed)
I still believe that anxiety is the primary issue for her.  She has had a normal MMSE and normal B12/folate previously.
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
## 2011-08-18 NOTE — Assessment & Plan Note (Signed)
BP Readings from Last 3 Encounters:  08/18/11 140/80  07/27/11 128/78  05/05/11 130/84  BP control is reasonable on current meds.  Continue same.

## 2011-08-18 NOTE — Assessment & Plan Note (Signed)
She is on statin.  This was recently increased as she was not at goal.  Plan to obtain lft and FLP next visit.

## 2011-08-18 NOTE — Assessment & Plan Note (Signed)
Obtain dexa.

## 2011-08-18 NOTE — Patient Instructions (Addendum)
Please schedule your mammogram on the first floor.  Schedule bone density at the front desk prior to leaving today.  Please follow up in 3 months.

## 2011-08-19 ENCOUNTER — Telehealth: Payer: Self-pay | Admitting: Family

## 2011-08-20 ENCOUNTER — Other Ambulatory Visit: Payer: Self-pay | Admitting: Family

## 2011-08-20 NOTE — Telephone Encounter (Signed)
Refill sent to pharmacy. Notified pt. 

## 2011-08-20 NOTE — Telephone Encounter (Signed)
Patient said the pharmacy hasn't received refills for her meds, she is out of valsartan, please send in refills to pharmacy on file

## 2011-08-28 ENCOUNTER — Telehealth: Payer: Self-pay | Admitting: *Deleted

## 2011-08-28 ENCOUNTER — Inpatient Hospital Stay: Admission: RE | Admit: 2011-08-28 | Payer: Medicare Other | Source: Ambulatory Visit

## 2011-08-28 NOTE — Telephone Encounter (Signed)
confidentialOffice [address], [place] [address]. [phone] f [phone] To: Lorrin Mais Fax: [phone]: [facility] Date/Time:08/27/2011 5:28 PCaller: Mindi Junker Facility: not collected Patient: Lisa Shaffer, Lisa Shaffer DOB: 11/09/1945 Phone: [phone] Reason for Call: See info belowMTakenBy:Dianne Hyacinth Meeker, CSRRegarding Appointment: Yes Appt Date: 08/31/2011 Appt Time: 10:20:00 AM Provider: Sandford Craze Reason: Details: Spouse has another appt.Outcome: Instructed patient to call back on the next business day.

## 2011-08-31 ENCOUNTER — Ambulatory Visit (HOSPITAL_BASED_OUTPATIENT_CLINIC_OR_DEPARTMENT_OTHER): Payer: Medicare Other

## 2011-09-01 ENCOUNTER — Ambulatory Visit (HOSPITAL_BASED_OUTPATIENT_CLINIC_OR_DEPARTMENT_OTHER)
Admission: RE | Admit: 2011-09-01 | Discharge: 2011-09-01 | Disposition: A | Discharge status: Home / Self Care | Payer: Medicare Other | Source: Ambulatory Visit | Attending: Family | Admitting: Family

## 2011-09-01 ENCOUNTER — Ambulatory Visit (INDEPENDENT_AMBULATORY_CARE_PROVIDER_SITE_OTHER)
Admission: RE | Admit: 2011-09-01 | Discharge: 2011-09-01 | Disposition: A | Discharge status: Home / Self Care | Payer: Medicare Other | Source: Ambulatory Visit

## 2011-09-01 DIAGNOSIS — Z1231 Encounter for screening mammogram for malignant neoplasm of breast: Secondary | ICD-10-CM | POA: Insufficient documentation

## 2011-09-01 DIAGNOSIS — M8580 Other specified disorders of bone density and structure, unspecified site: Secondary | ICD-10-CM

## 2011-09-01 DIAGNOSIS — M949 Disorder of cartilage, unspecified: Secondary | ICD-10-CM

## 2011-09-04 ENCOUNTER — Encounter: Payer: Self-pay | Admitting: Family

## 2011-09-07 ENCOUNTER — Encounter: Payer: Self-pay | Admitting: Family

## 2011-10-26 ENCOUNTER — Telehealth: Payer: Self-pay | Admitting: Family

## 2011-10-26 MED ORDER — NIFEDIPINE ER OSMOTIC RELEASE 30 MG PO TB24: 30.0000 mg | ORAL_TABLET | Freq: Every day | ORAL | Status: DC

## 2011-10-26 NOTE — Telephone Encounter (Signed)
Refill sent to pharmacy #[id number] x 1 refill. Pt has follow up in May.

## 2011-10-26 NOTE — Telephone Encounter (Signed)
Refill- nifedical xl 30mg  tablets. Take one tablet by mouth daily. Qty 30 last fill 3.5.13

## 2011-11-16 ENCOUNTER — Ambulatory Visit: Payer: Medicare Other | Admitting: Family

## 2011-12-01 ENCOUNTER — Encounter: Payer: Self-pay | Admitting: Internal Medicine

## 2011-12-01 ENCOUNTER — Ambulatory Visit (INDEPENDENT_AMBULATORY_CARE_PROVIDER_SITE_OTHER): Payer: Medicare Other | Admitting: Internal Medicine

## 2011-12-01 VITALS — BP 126/84 | HR 80 | Temp 98.7°F | Ht 60.0 in | Wt 133.0 lb

## 2011-12-01 DIAGNOSIS — F4323 Adjustment disorder with mixed anxiety and depressed mood: Secondary | ICD-10-CM

## 2011-12-01 DIAGNOSIS — E119 Type 2 diabetes mellitus without complications: Secondary | ICD-10-CM

## 2011-12-01 DIAGNOSIS — E785 Hyperlipidemia, unspecified: Secondary | ICD-10-CM

## 2011-12-01 MED ORDER — VALSARTAN-HYDROCHLOROTHIAZIDE 160-12.5 MG PO TABS: 1.0000 | ORAL_TABLET | Freq: Every day | ORAL | Status: DC

## 2011-12-01 MED ORDER — ATORVASTATIN CALCIUM 80 MG PO TABS: 80.0000 mg | ORAL_TABLET | Freq: Every day | ORAL | Status: DC

## 2011-12-01 MED ORDER — ALLOPURINOL 100 MG PO TABS: 300.0000 mg | ORAL_TABLET | Freq: Every day | ORAL | Status: DC

## 2011-12-01 MED ORDER — METFORMIN HCL 500 MG PO TABS: 500.0000 mg | ORAL_TABLET | Freq: Two times a day (BID) | ORAL | Status: DC

## 2011-12-01 MED ORDER — NIFEDIPINE ER OSMOTIC RELEASE 30 MG PO TB24: 30.0000 mg | ORAL_TABLET | Freq: Every day | ORAL | Status: DC

## 2011-12-01 NOTE — Assessment & Plan Note (Signed)
Stable off SSRIs.  I counseled patient regarding staying active while she is in between jobs. Patient urged to keep a regular schedule and remain socially active.

## 2011-12-01 NOTE — Assessment & Plan Note (Signed)
Continued 80 mg of Lipitor. Handout on low saturated fat diet provided. Repeat fasting lipid panel and LFTs before next office visit. If LDL still not at goal consider adding WelChol.

## 2011-12-01 NOTE — Assessment & Plan Note (Addendum)
Her A1c has worsened. No continue lifestyle changes but add metformin 500 mg twice daily. We discussed common side effects. Lab Results  Component Value Date   HGBA1C 6.8* 07/27/2011   Lab Results  Component Value Date   CREATININE 1.08 07/27/2011

## 2011-12-01 NOTE — Patient Instructions (Signed)
Please complete the following lab tests before your next follow up appointment: A1c, BMET - 250.00 Lipid panel, LFTs - 272.4
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
## 2012-01-05 ENCOUNTER — Telehealth: Payer: Self-pay | Admitting: Family

## 2012-01-05 NOTE — Telephone Encounter (Addendum)
Opened in error

## 2012-01-25 ENCOUNTER — Other Ambulatory Visit: Payer: Medicare Other

## 2012-01-26 ENCOUNTER — Other Ambulatory Visit (INDEPENDENT_AMBULATORY_CARE_PROVIDER_SITE_OTHER): Payer: Medicare Other

## 2012-01-26 DIAGNOSIS — E785 Hyperlipidemia, unspecified: Secondary | ICD-10-CM

## 2012-01-26 DIAGNOSIS — E119 Type 2 diabetes mellitus without complications: Secondary | ICD-10-CM

## 2012-01-26 LAB — HEPATIC FUNCTION PANEL
ALT: 19 U/L (ref 0–35)
Bilirubin, Direct: 0 mg/dL (ref 0.0–0.3)
Total Bilirubin: 0.7 mg/dL (ref 0.3–1.2)

## 2012-01-26 LAB — LIPID PANEL
Cholesterol: 184 mg/dL (ref 0–200)
HDL: 54.1 mg/dL (ref >39.00)
LDL Cholesterol: 105 mg/dL — ABNORMAL HIGH (ref 0–99)
Total CHOL/HDL Ratio: 3
Triglycerides: 124 mg/dL (ref 0.0–149.0)
VLDL: 24.8 mg/dL (ref 0.0–40.0)

## 2012-01-26 LAB — BASIC METABOLIC PANEL
Chloride: 108 mEq/L (ref 96–112)
Creatinine, Ser: 0.9 mg/dL (ref 0.4–1.2)
GFR: 80.45 mL/min (ref >60.00)

## 2012-01-26 LAB — HEMOGLOBIN A1C: Hgb A1c MFr Bld: 6.5 % (ref 4.6–6.5)

## 2012-02-01 ENCOUNTER — Ambulatory Visit: Payer: Medicare Other | Admitting: Internal Medicine

## 2012-03-15 ENCOUNTER — Ambulatory Visit (INDEPENDENT_AMBULATORY_CARE_PROVIDER_SITE_OTHER): Payer: Medicare Other | Admitting: Internal Medicine

## 2012-03-15 DIAGNOSIS — Z23 Encounter for immunization: Secondary | ICD-10-CM

## 2012-06-27 ENCOUNTER — Telehealth: Payer: Self-pay | Admitting: Family

## 2012-06-27 DIAGNOSIS — E785 Hyperlipidemia, unspecified: Secondary | ICD-10-CM

## 2012-06-27 MED ORDER — ATORVASTATIN CALCIUM 80 MG PO TABS: 80.0000 mg | ORAL_TABLET | Freq: Every day | ORAL | Status: DC

## 2012-06-27 NOTE — Telephone Encounter (Signed)
rx sent in electronically 

## 2012-06-27 NOTE — Telephone Encounter (Signed)
Refill atorvastatin 80 mg tablets take 1 tablet by mouth daily as directed qty 30 last fill 11-11-2011

## 2012-10-25 ENCOUNTER — Ambulatory Visit: Payer: Medicare Other | Admitting: Internal Medicine

## 2012-10-27 ENCOUNTER — Ambulatory Visit: Payer: Medicare Other | Admitting: Internal Medicine

## 2012-11-08 ENCOUNTER — Ambulatory Visit (INDEPENDENT_AMBULATORY_CARE_PROVIDER_SITE_OTHER): Payer: Medicare Other | Admitting: Internal Medicine

## 2012-11-08 ENCOUNTER — Encounter: Payer: Self-pay | Admitting: Internal Medicine

## 2012-11-08 VITALS — BP 154/92 | HR 72 | Temp 98.2°F | Wt 126.0 lb

## 2012-11-08 DIAGNOSIS — M25579 Pain in unspecified ankle and joints of unspecified foot: Secondary | ICD-10-CM

## 2012-11-08 DIAGNOSIS — M25571 Pain in right ankle and joints of right foot: Secondary | ICD-10-CM

## 2012-11-08 DIAGNOSIS — I10 Essential (primary) hypertension: Secondary | ICD-10-CM

## 2012-11-08 DIAGNOSIS — E785 Hyperlipidemia, unspecified: Secondary | ICD-10-CM

## 2012-11-08 DIAGNOSIS — R0789 Other chest pain: Secondary | ICD-10-CM | POA: Insufficient documentation

## 2012-11-08 MED ORDER — ALLOPURINOL 100 MG PO TABS: 100.0000 mg | ORAL_TABLET | Freq: Every day | ORAL | Status: DC

## 2012-11-08 MED ORDER — METFORMIN HCL 500 MG PO TABS: 500.0000 mg | ORAL_TABLET | Freq: Two times a day (BID) | ORAL | Status: DC

## 2012-11-08 MED ORDER — SERTRALINE HCL 50 MG PO TABS: 50.0000 mg | ORAL_TABLET | Freq: Every day | ORAL | Status: DC

## 2012-11-08 MED ORDER — ROSUVASTATIN CALCIUM 20 MG PO TABS: 20.0000 mg | ORAL_TABLET | Freq: Every day | ORAL | Status: DC

## 2012-11-08 MED ORDER — NIFEDIPINE ER OSMOTIC RELEASE 60 MG PO TB24: 60.0000 mg | ORAL_TABLET | Freq: Every day | ORAL | Status: DC

## 2012-11-08 MED ORDER — VALSARTAN-HYDROCHLOROTHIAZIDE 160-12.5 MG PO TABS: 1.0000 | ORAL_TABLET | Freq: Every day | ORAL | Status: DC

## 2012-11-08 NOTE — Assessment & Plan Note (Signed)
Patient hospitalized in March 2014 for chest pain. She ruled out with cardiac enzymes negative stress test. Her symptoms may be stress related. Continue sertraline 50 mg once daily.

## 2012-11-08 NOTE — Assessment & Plan Note (Signed)
Patient's blood pressure is suboptimally controlled. Increase Procardia XL to 60 mg once daily.  Monitor electrolytes and kidney function BP: 154/92 mmHg

## 2012-11-08 NOTE — Assessment & Plan Note (Signed)
Patient complains of ongoing issues with short-term memory loss. Unclear whether symptoms related to Lipitor use. Switch to Crestor 20 mg once daily.

## 2012-11-08 NOTE — Patient Instructions (Addendum)
Please complete the following lab tests before your next follow up appointment: BMET, A1c - 790.29 FLP, LFTs, TSH - 272.4 Vitamin B12, RPR - 780.93 Uric acid - 274.9
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
## 2012-11-29 ENCOUNTER — Other Ambulatory Visit (INDEPENDENT_AMBULATORY_CARE_PROVIDER_SITE_OTHER): Payer: Medicare Other

## 2012-11-29 DIAGNOSIS — R413 Other amnesia: Secondary | ICD-10-CM

## 2012-11-29 DIAGNOSIS — R7309 Other abnormal glucose: Secondary | ICD-10-CM

## 2012-11-29 DIAGNOSIS — E785 Hyperlipidemia, unspecified: Secondary | ICD-10-CM

## 2012-11-29 LAB — BASIC METABOLIC PANEL
BUN: 27 mg/dL — ABNORMAL HIGH (ref 6–23)
CO2: 27 mEq/L (ref 19–32)
Chloride: 107 mEq/L (ref 96–112)
Creatinine, Ser: 1.2 mg/dL (ref 0.4–1.2)
Glucose, Bld: 129 mg/dL — ABNORMAL HIGH (ref 70–99)
Potassium: 3.9 mEq/L (ref 3.5–5.1)

## 2012-11-29 LAB — HEPATIC FUNCTION PANEL
Alkaline Phosphatase: 57 U/L (ref 39–117)
Bilirubin, Direct: 0.1 mg/dL (ref 0.0–0.3)
Total Bilirubin: 0.8 mg/dL (ref 0.3–1.2)
Total Protein: 7.3 g/dL (ref 6.0–8.3)

## 2012-11-29 LAB — LIPID PANEL
HDL: 53.5 mg/dL (ref >39.00)
Total CHOL/HDL Ratio: 4
VLDL: 13.8 mg/dL (ref 0.0–40.0)

## 2012-11-29 LAB — LDL CHOLESTEROL, DIRECT: Direct LDL: 170.8 mg/dL

## 2012-11-29 LAB — VITAMIN B12: Vitamin B-12: 681 pg/mL (ref 211–911)

## 2012-11-30 LAB — RPR

## 2012-12-06 ENCOUNTER — Ambulatory Visit (INDEPENDENT_AMBULATORY_CARE_PROVIDER_SITE_OTHER): Payer: Medicare Other | Admitting: Internal Medicine

## 2012-12-06 ENCOUNTER — Encounter: Payer: Self-pay | Admitting: Internal Medicine

## 2012-12-06 VITALS — BP 108/74 | Temp 98.5°F | Wt 127.0 lb

## 2012-12-06 DIAGNOSIS — E119 Type 2 diabetes mellitus without complications: Secondary | ICD-10-CM

## 2012-12-06 DIAGNOSIS — R413 Other amnesia: Secondary | ICD-10-CM
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
## 2012-12-06 NOTE — Assessment & Plan Note (Signed)
I doubt short-term memory loss secondary to statin use. Repeat B12, thyroid and RPR were normal. She has had normal Mini-Mental Status exam in the past.

## 2012-12-06 NOTE — Assessment & Plan Note (Signed)
Well controlled.  No change in medications. Lab Results  Component Value Date   HGBA1C 6.2 11/29/2012

## 2012-12-06 NOTE — Patient Instructions (Addendum)
Please complete the following lab tests before your next follow up appointment: BMET, A1c - 250.00 FLP, LFTs - 272.4 

## 2013-01-12 ENCOUNTER — Other Ambulatory Visit: Payer: Self-pay

## 2013-02-21 ENCOUNTER — Encounter: Payer: Self-pay | Admitting: Internal Medicine

## 2013-02-21 ENCOUNTER — Ambulatory Visit (INDEPENDENT_AMBULATORY_CARE_PROVIDER_SITE_OTHER): Payer: Medicare Other | Admitting: Internal Medicine

## 2013-02-21 VITALS — BP 132/90 | HR 76 | Temp 98.4°F | Wt 129.0 lb

## 2013-02-21 DIAGNOSIS — I10 Essential (primary) hypertension: Secondary | ICD-10-CM

## 2013-02-21 DIAGNOSIS — E119 Type 2 diabetes mellitus without complications: Secondary | ICD-10-CM

## 2013-02-21 DIAGNOSIS — R413 Other amnesia: Secondary | ICD-10-CM

## 2013-02-21 DIAGNOSIS — F4323 Adjustment disorder with mixed anxiety and depressed mood: Secondary | ICD-10-CM

## 2013-02-21 DIAGNOSIS — E785 Hyperlipidemia, unspecified: Secondary | ICD-10-CM

## 2013-02-21 LAB — BASIC METABOLIC PANEL
Calcium: 10.3 mg/dL (ref 8.4–10.5)
GFR: 79.17 mL/min (ref >60.00)
Glucose, Bld: 97 mg/dL (ref 70–99)
Potassium: 4.5 mEq/L (ref 3.5–5.1)
Sodium: 138 mEq/L (ref 135–145)

## 2013-02-21 LAB — CK: Total CK: 66 U/L (ref 7–177)

## 2013-02-21 LAB — LIPID PANEL
HDL: 48.8 mg/dL (ref >39.00)
Total CHOL/HDL Ratio: 4
Triglycerides: 149 mg/dL (ref 0.0–149.0)
VLDL: 29.8 mg/dL (ref 0.0–40.0)

## 2013-02-21 LAB — HEPATIC FUNCTION PANEL
Alkaline Phosphatase: 71 U/L (ref 39–117)
Bilirubin, Direct: 0 mg/dL (ref 0.0–0.3)
Total Bilirubin: 0.5 mg/dL (ref 0.3–1.2)
Total Protein: 7.7 g/dL (ref 6.0–8.3)

## 2013-02-21 LAB — LDL CHOLESTEROL, DIRECT: Direct LDL: 144.7 mg/dL

## 2013-02-21 MED ORDER — SERTRALINE HCL 50 MG PO TABS: 50.0000 mg | ORAL_TABLET | Freq: Every day | ORAL | Status: DC

## 2013-02-21 NOTE — Assessment & Plan Note (Signed)
Patient reports home blood sugar readings are normal. Continue same dose of metformin. Monitor A1c.

## 2013-02-21 NOTE — Assessment & Plan Note (Signed)
Patient having persistent issues. Refer for neuropsychiatric testing.

## 2013-02-21 NOTE — Assessment & Plan Note (Signed)
Patient advised to discontinued atorvastatin. Continue Crestor 20 mg once daily. Check LFTs, CPK and fasting lipid panel.
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
## 2013-02-21 NOTE — Assessment & Plan Note (Signed)
Restart sertraline.

## 2013-02-28 ENCOUNTER — Other Ambulatory Visit: Payer: Medicare Other

## 2013-03-01 ENCOUNTER — Other Ambulatory Visit (INDEPENDENT_AMBULATORY_CARE_PROVIDER_SITE_OTHER): Payer: Medicare Other

## 2013-03-01 DIAGNOSIS — E1059 Type 1 diabetes mellitus with other circulatory complications: Secondary | ICD-10-CM

## 2013-03-01 DIAGNOSIS — E785 Hyperlipidemia, unspecified: Secondary | ICD-10-CM

## 2013-03-01 LAB — HEMOGLOBIN A1C: Hgb A1c MFr Bld: 6.6 % — ABNORMAL HIGH (ref 4.6–6.5)

## 2013-03-02 LAB — HEPATIC FUNCTION PANEL
ALT: 15 U/L (ref 0–35)
AST: 20 U/L (ref 0–37)
Alkaline Phosphatase: 59 U/L (ref 39–117)
Bilirubin, Direct: 0.1 mg/dL (ref 0.0–0.3)
Total Protein: 7.5 g/dL (ref 6.0–8.3)

## 2013-03-02 LAB — BASIC METABOLIC PANEL
BUN: 19 mg/dL (ref 6–23)
Calcium: 10.1 mg/dL (ref 8.4–10.5)
Creatinine, Ser: 1 mg/dL (ref 0.4–1.2)
GFR: 71 mL/min (ref >60.00)

## 2013-03-03 LAB — LDL CHOLESTEROL, DIRECT: Direct LDL: 152.3 mg/dL

## 2013-03-09 ENCOUNTER — Ambulatory Visit (INDEPENDENT_AMBULATORY_CARE_PROVIDER_SITE_OTHER): Payer: Medicare Other | Admitting: Internal Medicine

## 2013-03-09 ENCOUNTER — Encounter: Payer: Self-pay | Admitting: Internal Medicine

## 2013-03-09 VITALS — BP 120/78 | HR 78 | Temp 98.5°F | Wt 129.0 lb

## 2013-03-09 DIAGNOSIS — E785 Hyperlipidemia, unspecified: Secondary | ICD-10-CM

## 2013-03-09 DIAGNOSIS — E119 Type 2 diabetes mellitus without complications: Secondary | ICD-10-CM

## 2013-03-09 DIAGNOSIS — Z23 Encounter for immunization: Secondary | ICD-10-CM

## 2013-03-09 MED ORDER — ALLOPURINOL 100 MG PO TABS: 100.0000 mg | ORAL_TABLET | Freq: Every day | ORAL | Status: DC

## 2013-03-09 MED ORDER — VALSARTAN-HYDROCHLOROTHIAZIDE 160-12.5 MG PO TABS: 1.0000 | ORAL_TABLET | Freq: Every day | ORAL | Status: DC

## 2013-03-09 MED ORDER — METFORMIN HCL 500 MG PO TABS: 500.0000 mg | ORAL_TABLET | Freq: Two times a day (BID) | ORAL | Status: DC

## 2013-03-09 MED ORDER — NIFEDIPINE ER OSMOTIC RELEASE 60 MG PO TB24: 60.0000 mg | ORAL_TABLET | Freq: Every day | ORAL | Status: DC

## 2013-03-09 NOTE — Assessment & Plan Note (Signed)
Unclear whether patient was taking both atorvastatin and Crestor. Patient now taking Crestor 20 mg once daily. Patient encouraged to follow low saturated fat diet. Repeat fasting lipid panel and LFTs in 3 months.
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
## 2013-03-09 NOTE — Patient Instructions (Signed)
Please remember to take your Crestor daily Please complete the following lab tests before your next follow up appointment: BMET, A1c - 250.00 FLP, LFTs - 272.4

## 2013-03-09 NOTE — Assessment & Plan Note (Signed)
A1c is stable.  Foot exam in normal.  Patient reports diabetic eye exam within the last one year. Obtain copy of records from her ophthalmologist.  Patient updated with influenza vaccine. Lab Results  Component Value Date   HGBA1C 6.6* 03/01/2013

## 2013-04-24 DIAGNOSIS — R413 Other amnesia: Secondary | ICD-10-CM

## 2013-04-28 DIAGNOSIS — R413 Other amnesia: Secondary | ICD-10-CM

## 2013-05-10 ENCOUNTER — Telehealth: Payer: Self-pay

## 2013-05-10 DIAGNOSIS — R413 Other amnesia: Secondary | ICD-10-CM

## 2013-05-10 NOTE — Telephone Encounter (Signed)
Message copied by Azucena Freed on Wed May 10, 2013  2:34 PM ------      Message from: Meda Coffee      Created: Wed May 10, 2013  1:29 PM       Call pt - I received results of neuropsych testing.  MRI of Brain w/o contrast recommended.  Please place order - use memory loss code or dementia code.            thx            RY ------

## 2013-05-10 NOTE — Telephone Encounter (Signed)
Called and spoke with pt and pt is aware.  MRI ordered.

## 2013-05-21 ENCOUNTER — Ambulatory Visit
Admission: RE | Admit: 2013-05-21 | Discharge: 2013-05-21 | Disposition: A | Discharge status: Home / Self Care | Payer: Medicare Other | Source: Ambulatory Visit | Attending: Internal Medicine | Admitting: Internal Medicine

## 2013-05-21 DIAGNOSIS — R413 Other amnesia: Secondary | ICD-10-CM

## 2013-05-23 ENCOUNTER — Telehealth: Payer: Self-pay | Admitting: Internal Medicine

## 2013-05-23 NOTE — Telephone Encounter (Signed)
Pt would like mri results

## 2013-05-23 NOTE — Telephone Encounter (Signed)
See result note.  

## 2013-05-25 ENCOUNTER — Ambulatory Visit: Payer: Medicare Other | Admitting: Internal Medicine

## 2013-05-31 ENCOUNTER — Ambulatory Visit: Payer: Medicare Other | Admitting: Internal Medicine

## 2013-06-16 ENCOUNTER — Ambulatory Visit: Payer: Medicare Other | Admitting: Internal Medicine

## 2013-07-05 ENCOUNTER — Encounter: Payer: Self-pay | Admitting: Internal Medicine

## 2013-07-05 ENCOUNTER — Ambulatory Visit (INDEPENDENT_AMBULATORY_CARE_PROVIDER_SITE_OTHER): Payer: Medicare Other | Admitting: Internal Medicine

## 2013-07-05 VITALS — BP 154/82 | HR 72 | Temp 98.3°F | Ht 60.0 in | Wt 127.0 lb

## 2013-07-05 DIAGNOSIS — I10 Essential (primary) hypertension: Secondary | ICD-10-CM

## 2013-07-05 DIAGNOSIS — R413 Other amnesia: Secondary | ICD-10-CM

## 2013-07-05 DIAGNOSIS — E119 Type 2 diabetes mellitus without complications: Secondary | ICD-10-CM

## 2013-07-05 MED ORDER — ROSUVASTATIN CALCIUM 20 MG PO TABS: 20.0000 mg | ORAL_TABLET | Freq: Every day | ORAL | Status: DC

## 2013-07-05 MED ORDER — NIFEDIPINE ER OSMOTIC RELEASE 90 MG PO TB24: 90.0000 mg | ORAL_TABLET | Freq: Every day | ORAL | Status: DC

## 2013-07-05 MED ORDER — DONEPEZIL HCL 5 MG PO TABS: 5.0000 mg | ORAL_TABLET | Freq: Every day | ORAL | Status: DC

## 2013-07-05 NOTE — Assessment & Plan Note (Signed)
Blood pressure is still suboptimally controlled. Increase Procardia XL to 90 mg once daily. Continue ARB and HCTZ.  BP: 154/82 mmHg  Patient advised to obtain automated BP cuff and monitor blood pressure at home.

## 2013-07-05 NOTE — Assessment & Plan Note (Addendum)
Patient's blood sugar is well controlled. Monitor A1c. We discussed higher risk of macrovascular complications. I stressed importance of tight blood pressure control. BP: 154/82 mmHg  Lab Results  Component Value Date   HGBA1C 6.6* 03/01/2013

## 2013-07-05 NOTE — Progress Notes (Signed)
Pre visit review using our clinic review tool, if applicable. No additional management support is needed unless otherwise documented below in the visit note. 
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
## 2013-07-05 NOTE — Assessment & Plan Note (Signed)
Neuropsychiatric testing showed issues with short-term memory loss. MRI of brain showed moderate changes of small vessel disease. No evidence of previous significant strokes. Continue aggressive risk factor management. Start Aricept 5 mg once daily.

## 2013-10-02 ENCOUNTER — Ambulatory Visit (INDEPENDENT_AMBULATORY_CARE_PROVIDER_SITE_OTHER): Payer: Medicare Other | Admitting: Internal Medicine

## 2013-10-02 ENCOUNTER — Encounter: Payer: Self-pay | Admitting: Internal Medicine

## 2013-10-02 VITALS — BP 142/90 | HR 76 | Temp 98.0°F | Ht 60.0 in | Wt 131.0 lb

## 2013-10-02 DIAGNOSIS — E119 Type 2 diabetes mellitus without complications: Secondary | ICD-10-CM

## 2013-10-02 DIAGNOSIS — I10 Essential (primary) hypertension: Secondary | ICD-10-CM

## 2013-10-02 DIAGNOSIS — R413 Other amnesia: Secondary | ICD-10-CM

## 2013-10-02 MED ORDER — VALSARTAN-HYDROCHLOROTHIAZIDE 320-12.5 MG PO TABS: 1.0000 | ORAL_TABLET | Freq: Every day | ORAL | Status: DC

## 2013-10-02 MED ORDER — DONEPEZIL HCL 10 MG PO TABS: 10.0000 mg | ORAL_TABLET | Freq: Every day | ORAL | Status: DC

## 2013-10-02 NOTE — Assessment & Plan Note (Signed)
Monitor A1c before next office visit. 

## 2013-10-02 NOTE — Assessment & Plan Note (Signed)
Neuropsychiatric testing showed mild cognitive impairment. Increase Aricept to 10 mg. Patient noticing progressive short-term memory loss. Consider repeat neuropsychiatric testing in 6 months.
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
## 2013-10-02 NOTE — Assessment & Plan Note (Signed)
Blood pressure still uncontrolled despite higher dose of Procardia XL 90 mg. Increase valsartan HCT dose to 320/12.5 mg. BP: 142/90 mmHg  Monitor electrolytes and kidney function.

## 2013-10-02 NOTE — Progress Notes (Signed)
Pre visit review using our clinic review tool, if applicable. No additional management support is needed unless otherwise documented below in the visit note. 

## 2013-10-02 NOTE — Patient Instructions (Addendum)
Please complete the following lab tests before your next follow up appointment: BMET - 401.9 A1c - 250.00 FLP, LFTs - 272.4 

## 2013-10-12 ENCOUNTER — Other Ambulatory Visit: Payer: Self-pay

## 2013-10-13 ENCOUNTER — Telehealth: Payer: Self-pay

## 2013-10-13 NOTE — Telephone Encounter (Signed)
Relevant patient education assigned to patient using Emmi. ° °

## 2013-12-11 ENCOUNTER — Ambulatory Visit (INDEPENDENT_AMBULATORY_CARE_PROVIDER_SITE_OTHER): Payer: Medicare Other | Admitting: Internal Medicine

## 2013-12-11 ENCOUNTER — Encounter: Payer: Self-pay | Admitting: Internal Medicine

## 2013-12-11 VITALS — BP 130/82 | HR 72 | Temp 98.6°F | Ht 60.0 in | Wt 131.0 lb

## 2013-12-11 DIAGNOSIS — F4323 Adjustment disorder with mixed anxiety and depressed mood: Secondary | ICD-10-CM

## 2013-12-11 DIAGNOSIS — G8929 Other chronic pain: Secondary | ICD-10-CM

## 2013-12-11 DIAGNOSIS — R413 Other amnesia: Secondary | ICD-10-CM

## 2013-12-11 DIAGNOSIS — M25579 Pain in unspecified ankle and joints of unspecified foot: Secondary | ICD-10-CM

## 2013-12-11 DIAGNOSIS — I10 Essential (primary) hypertension: Secondary | ICD-10-CM

## 2013-12-11 DIAGNOSIS — M25552 Pain in left hip: Secondary | ICD-10-CM | POA: Insufficient documentation

## 2013-12-11 DIAGNOSIS — M25571 Pain in right ankle and joints of right foot: Secondary | ICD-10-CM

## 2013-12-11 DIAGNOSIS — M25559 Pain in unspecified hip: Secondary | ICD-10-CM

## 2013-12-11 DIAGNOSIS — E785 Hyperlipidemia, unspecified: Secondary | ICD-10-CM

## 2013-12-11 DIAGNOSIS — M25532 Pain in left wrist: Secondary | ICD-10-CM

## 2013-12-11 DIAGNOSIS — E119 Type 2 diabetes mellitus without complications: Secondary | ICD-10-CM

## 2013-12-11 DIAGNOSIS — M25539 Pain in unspecified wrist: Secondary | ICD-10-CM

## 2013-12-11 LAB — LIPID PANEL
CHOLESTEROL: 224 mg/dL — AB (ref 0–200)
HDL: 41 mg/dL (ref >39.00)
LDL Cholesterol: 100 mg/dL — ABNORMAL HIGH (ref 0–99)
NonHDL: 183
TRIGLYCERIDES: 414 mg/dL — AB (ref 0.0–149.0)
Total CHOL/HDL Ratio: 5
VLDL: 82.8 mg/dL — ABNORMAL HIGH (ref 0.0–40.0)

## 2013-12-11 LAB — HEPATIC FUNCTION PANEL
ALT: 14 U/L (ref 0–35)
AST: 18 U/L (ref 0–37)
Albumin: 3.7 g/dL (ref 3.5–5.2)
Alkaline Phosphatase: 72 U/L (ref 39–117)
Bilirubin, Direct: 0 mg/dL (ref 0.0–0.3)
TOTAL PROTEIN: 6.7 g/dL (ref 6.0–8.3)
Total Bilirubin: 0.4 mg/dL (ref 0.2–1.2)

## 2013-12-11 LAB — BASIC METABOLIC PANEL
BUN: 13 mg/dL (ref 6–23)
CALCIUM: 9.6 mg/dL (ref 8.4–10.5)
CO2: 29 meq/L (ref 19–32)
Chloride: 109 mEq/L (ref 96–112)
Creatinine, Ser: 1 mg/dL (ref 0.4–1.2)
GFR: 74.26 mL/min (ref >60.00)
Glucose, Bld: 129 mg/dL — ABNORMAL HIGH (ref 70–99)
Potassium: 4.3 mEq/L (ref 3.5–5.1)
Sodium: 143 mEq/L (ref 135–145)

## 2013-12-11 LAB — HEMOGLOBIN A1C: Hgb A1c MFr Bld: 6.6 % — ABNORMAL HIGH (ref 4.6–6.5)

## 2013-12-11 MED ORDER — DONEPEZIL HCL 10 MG PO TABS: ORAL_TABLET | ORAL | Status: DC

## 2013-12-11 MED ORDER — SERTRALINE HCL 100 MG PO TABS: 100.0000 mg | ORAL_TABLET | Freq: Every day | ORAL | Status: DC

## 2013-12-11 MED ORDER — NIFEDIPINE ER OSMOTIC RELEASE 90 MG PO TB24: 90.0000 mg | ORAL_TABLET | Freq: Every day | ORAL | Status: DC

## 2013-12-11 MED ORDER — VALSARTAN-HYDROCHLOROTHIAZIDE 320-12.5 MG PO TABS: 1.0000 | ORAL_TABLET | Freq: Every day | ORAL | Status: DC

## 2013-12-11 MED ORDER — METFORMIN HCL 500 MG PO TABS: 500.0000 mg | ORAL_TABLET | Freq: Two times a day (BID) | ORAL | Status: DC

## 2013-12-11 MED ORDER — ROSUVASTATIN CALCIUM 20 MG PO TABS: 20.0000 mg | ORAL_TABLET | Freq: Every day | ORAL | Status: DC

## 2013-12-11 NOTE — Assessment & Plan Note (Addendum)
Patient experiencing worsening memory issues which seem be related to mood issues.  Increase Sertraline to 100 mg.  Refer to psychiatrist for further evaluation.  Repeat thyroid studies and B12 level.  Previous RPR level 11/2012 was negative.

## 2013-12-11 NOTE — Assessment & Plan Note (Signed)
Patient experiencing mild wrist/left thumb pain after recent fall. Rule out fracture. Obtain left hand x-ray.

## 2013-12-11 NOTE — Assessment & Plan Note (Signed)
Improved.  Continue current medication regimen. BP: 130/82 mmHg
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
## 2013-12-11 NOTE — Assessment & Plan Note (Signed)
Monitor A1c.  No change in medication.

## 2013-12-11 NOTE — Assessment & Plan Note (Signed)
Patient has history of previous right ankle fracture with plate and screws. She is experiencing exacerbation of pain after recent fall. She was previously evaluated by Dr. Victorino Dike.  Arrange reevaluation.

## 2013-12-11 NOTE — Progress Notes (Signed)
Pre visit review using our clinic review tool, if applicable. No additional management support is needed unless otherwise documented below in the visit note. 

## 2013-12-11 NOTE — Assessment & Plan Note (Signed)
Patient complains of mild left hip pain after recent fall. She has minimal discomfort with internal rotation. Obtain x-ray of left hip.

## 2013-12-12 LAB — VITAMIN B12: VITAMIN B 12: 380 pg/mL (ref 211–911)

## 2013-12-12 LAB — T4, FREE: FREE T4: 0.98 ng/dL (ref 0.60–1.60)

## 2013-12-12 LAB — TSH: TSH: 1.06 u[IU]/mL (ref 0.35–4.50)

## 2014-02-14 ENCOUNTER — Ambulatory Visit: Payer: Medicare Other | Admitting: Internal Medicine

## 2014-02-15 ENCOUNTER — Ambulatory Visit: Payer: Medicare Other | Admitting: Internal Medicine

## 2014-03-09 ENCOUNTER — Ambulatory Visit: Payer: Medicare Other | Admitting: Internal Medicine

## 2014-03-23 ENCOUNTER — Ambulatory Visit (INDEPENDENT_AMBULATORY_CARE_PROVIDER_SITE_OTHER): Payer: Medicare Other | Admitting: Internal Medicine

## 2014-03-23 VITALS — BP 120/80 | Temp 98.7°F | Wt 124.0 lb

## 2014-03-23 DIAGNOSIS — R413 Other amnesia: Secondary | ICD-10-CM

## 2014-03-23 DIAGNOSIS — M25571 Pain in right ankle and joints of right foot: Secondary | ICD-10-CM

## 2014-03-23 DIAGNOSIS — G8929 Other chronic pain: Secondary | ICD-10-CM

## 2014-03-23 DIAGNOSIS — I10 Essential (primary) hypertension: Secondary | ICD-10-CM

## 2014-03-23 DIAGNOSIS — E119 Type 2 diabetes mellitus without complications: Secondary | ICD-10-CM

## 2014-03-23 DIAGNOSIS — M25579 Pain in unspecified ankle and joints of unspecified foot: Secondary | ICD-10-CM

## 2014-03-23 DIAGNOSIS — Z23 Encounter for immunization: Secondary | ICD-10-CM
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
## 2014-03-23 NOTE — Progress Notes (Signed)
Pre visit review using our clinic review tool, if applicable. No additional management support is needed unless otherwise documented below in the visit note. 

## 2014-03-23 NOTE — Patient Instructions (Signed)
Please complete the following lab tests before your next follow up appointment: BMET, A1c - 250.00 

## 2014-03-23 NOTE — Assessment & Plan Note (Signed)
Stable. Lab Results  Component Value Date   HGBA1C 6.6* 12/11/2013   HGBA1C 6.6* 03/01/2013   HGBA1C 6.6* 02/21/2013   Lab Results  Component Value Date   MICROALBUR 1.55 02/03/2011   LDLCALC 100* 12/11/2013   CREATININE 1.0 12/11/2013

## 2014-03-23 NOTE — Assessment & Plan Note (Signed)
BP at goal. BP: 120/80 mmHg

## 2014-03-23 NOTE — Assessment & Plan Note (Signed)
Her symptoms are intermittent.  Follow up with Dr. Victorino Dike as needed.

## 2014-03-23 NOTE — Assessment & Plan Note (Signed)
Patient previously underwent neuropsychiatric testing in October 2014. She was diagnosed with mild cognitive impairment. Patient noted to have vascular risk factors. Her last MRI shows white matter disease. Her B12 RPR and TSH are normal. She continues to have short-term memory issues. Difficult to discern how much of her symptoms are related to her mood issues. Refer to neurologist for further evaluation.  Continue same dose of Aricept 10 mg.

## 2014-03-24 LAB — BASIC METABOLIC PANEL
BUN: 16 mg/dL (ref 6–23)
CALCIUM: 10.6 mg/dL — AB (ref 8.4–10.5)
CO2: 27 mEq/L (ref 19–32)
Chloride: 106 mEq/L (ref 96–112)
Creat: 1.07 mg/dL (ref 0.50–1.10)
Glucose, Bld: 122 mg/dL — ABNORMAL HIGH (ref 70–99)
POTASSIUM: 4.6 meq/L (ref 3.5–5.3)
SODIUM: 142 meq/L (ref 135–145)

## 2014-03-24 LAB — MICROALBUMIN / CREATININE URINE RATIO
CREATININE, URINE: 252.4 mg/dL
MICROALB UR: 3.79 mg/dL — AB (ref 0.00–1.89)
MICROALB/CREAT RATIO: 15 mg/g (ref 0.0–30.0)

## 2014-03-24 LAB — HEMOGLOBIN A1C
HEMOGLOBIN A1C: 6.9 % — AB (ref <5.7)
MEAN PLASMA GLUCOSE: 151 mg/dL — AB (ref <117)

## 2014-03-27 ENCOUNTER — Ambulatory Visit (INDEPENDENT_AMBULATORY_CARE_PROVIDER_SITE_OTHER): Payer: Medicare Other | Admitting: Neurology

## 2014-03-27 ENCOUNTER — Encounter: Payer: Self-pay | Admitting: Neurology

## 2014-03-27 VITALS — BP 118/78 | HR 65 | Resp 16 | Ht 59.5 in | Wt 127.0 lb

## 2014-03-27 DIAGNOSIS — R413 Other amnesia: Secondary | ICD-10-CM

## 2014-03-27 NOTE — Patient Instructions (Addendum)
1. Follow-up Neuropsychological evaluation with Dr. Leonides Cave 2. Physical and brain stimulation exercises, control of BP, sugars, are important for brain health 3. Continue all current medications 4. Follow-up after testing
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
## 2014-05-01 ENCOUNTER — Other Ambulatory Visit: Payer: Self-pay | Admitting: Internal Medicine

## 2014-05-01 ENCOUNTER — Other Ambulatory Visit (INDEPENDENT_AMBULATORY_CARE_PROVIDER_SITE_OTHER): Payer: Medicare Other

## 2014-05-01 DIAGNOSIS — I10 Essential (primary) hypertension: Secondary | ICD-10-CM

## 2014-05-01 DIAGNOSIS — E213 Hyperparathyroidism, unspecified: Secondary | ICD-10-CM

## 2014-05-01 LAB — BASIC METABOLIC PANEL
BUN: 9 mg/dL (ref 6–23)
CHLORIDE: 109 meq/L (ref 96–112)
CO2: 25 mEq/L (ref 19–32)
Calcium: 9.7 mg/dL (ref 8.4–10.5)
Creatinine, Ser: 1 mg/dL (ref 0.4–1.2)
GFR: 74.17 mL/min (ref >60.00)
Glucose, Bld: 129 mg/dL — ABNORMAL HIGH (ref 70–99)
POTASSIUM: 4 meq/L (ref 3.5–5.1)
Sodium: 141 mEq/L (ref 135–145)

## 2014-05-02 LAB — PTH, INTACT AND CALCIUM
Calcium: 9.8 mg/dL (ref 8.4–10.5)
PTH: 129 pg/mL — ABNORMAL HIGH (ref 14–64)

## 2014-05-04 ENCOUNTER — Ambulatory Visit (INDEPENDENT_AMBULATORY_CARE_PROVIDER_SITE_OTHER): Payer: Medicare Other | Admitting: Internal Medicine

## 2014-05-04 ENCOUNTER — Encounter: Payer: Self-pay | Admitting: Internal Medicine

## 2014-05-04 VITALS — BP 130/80 | HR 68 | Temp 98.3°F | Ht 59.5 in | Wt 127.0 lb

## 2014-05-04 DIAGNOSIS — E213 Hyperparathyroidism, unspecified: Secondary | ICD-10-CM

## 2014-05-04 LAB — VITAMIN D 25 HYDROXY (VIT D DEFICIENCY, FRACTURES): Vit D, 25-Hydroxy: 38 ng/mL (ref 30–89)
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
## 2014-05-04 NOTE — Assessment & Plan Note (Signed)
Patient has significantly elevated PTH levels. Her calcium is within normal range. I doubt she has secondary hyperparathyroidism. She has normal renal function. Her vitamin D level is normal. I suspect she likely has primary hyperparathyroidism. Refer to endocrinology for further evaluation and treatment.

## 2014-05-04 NOTE — Progress Notes (Signed)
Pre visit review using our clinic review tool, if applicable. No additional management support is needed unless otherwise documented below in the visit note. 

## 2014-05-17 ENCOUNTER — Ambulatory Visit: Payer: Medicare Other | Admitting: Internal Medicine

## 2014-05-24 ENCOUNTER — Encounter: Payer: Self-pay | Admitting: Internal Medicine

## 2014-05-24 ENCOUNTER — Ambulatory Visit (INDEPENDENT_AMBULATORY_CARE_PROVIDER_SITE_OTHER): Payer: Medicare Other | Admitting: Internal Medicine

## 2014-05-24 DIAGNOSIS — E213 Hyperparathyroidism, unspecified: Secondary | ICD-10-CM

## 2014-05-24 DIAGNOSIS — E041 Nontoxic single thyroid nodule: Secondary | ICD-10-CM

## 2014-05-24 MED ORDER — FUROSEMIDE 20 MG PO TABS: 10.0000 mg | ORAL_TABLET | Freq: Every day | ORAL | Status: DC

## 2014-05-24 MED ORDER — VALSARTAN 320 MG PO TABS: 320.0000 mg | ORAL_TABLET | Freq: Every day | ORAL | Status: DC

## 2014-05-24 NOTE — Patient Instructions (Signed)
Please stop the Diovan. Start valsartan 320 mg daily. Start furosemide 10 mg daily (you will need to cut the 20 mg tablet in half). Come back for labs in 1 month. I will let you know at that time if we need to order additional labs. I ordered your thyroid ultrasound and [facility]. Please return in 4 months.
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
## 2014-05-30 ENCOUNTER — Ambulatory Visit
Admission: RE | Admit: 2014-05-30 | Discharge: 2014-05-30 | Disposition: A | Discharge status: Home / Self Care | Payer: Medicare Other | Source: Ambulatory Visit | Attending: Internal Medicine | Admitting: Internal Medicine

## 2014-05-30 ENCOUNTER — Telehealth: Payer: Self-pay | Admitting: Internal Medicine

## 2014-05-30 NOTE — Telephone Encounter (Signed)
Pt is not interesting in getting on eye exam right now.

## 2014-05-31 NOTE — Addendum Note (Signed)
Addended by: Carlus PavlovGHERGHE, Ia Leeb on: 05/31/2014 01:10 PM   Modules accepted: Level of Service

## 2014-06-26 ENCOUNTER — Other Ambulatory Visit: Payer: Medicare Other

## 2014-06-26 ENCOUNTER — Encounter: Payer: Self-pay | Admitting: Neurology

## 2014-06-26 ENCOUNTER — Ambulatory Visit (INDEPENDENT_AMBULATORY_CARE_PROVIDER_SITE_OTHER): Payer: Medicare Other | Admitting: Neurology

## 2014-06-26 VITALS — BP 158/92 | HR 77 | Resp 16 | Ht 59.5 in | Wt 130.0 lb

## 2014-06-26 DIAGNOSIS — R413 Other amnesia: Secondary | ICD-10-CM

## 2014-06-26 NOTE — Progress Notes (Signed)
NEUROLOGY FOLLOW UP OFFICE NOTE  Lisa Shaffer [id number]  HISTORY OF PRESENT ILLNESS: I had the pleasure of seeing Lisa Shaffer in follow-up in the neurology clinic on 06/26/2014.  The patient was last seen 3 months ago for worsening memory loss and is again accompanied by her husband who helps supplement history today. We had planned for repeat Neuropsychological evaluation on her last visit, she is scheduled for this next month. She and her husband are unsure about the appointment date. She continues on Aricept 10mg /day with no side effects. Her husband denies any worsening of symptoms, she continues to be forgetful, and likes to keep old papers such as old lists of medications, which would confuse her. He reports she has good days and bad days. She remembers to take her medications. No difficulties with ADLs. She cannot recall what they did for Thanksgiving last month. She writes things down but still would forget what she wrote.  HPI: This is a very pleasant 68 yo RH woman with vascular risk factors including hypertension, diabetes, hyperlipidemia, who presented with worsening memory loss. Symptoms started around 4 years ago, she would forget names, conversations, misplace things. Her husband reports an incident when she was still working in 2012 when she would finish working on a stack of papers, put them in a pile, then go through that same pile. 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 After she retired, she continued to do the same thing of reading something, putting it down, then reading it again. She would ask the same questions repeatedly such as what he wants for breakfast. They started writing down reminders on a calendar, but now she would go back and forth to the calendar reading the same thing as if she is unsure. She has difficulties multitasking. If something is bothering  her, symptoms would get worse. She stopped driving after retirement, she states driving makes her feel uneasy, she always wants to make sure she was on the right place on the street. She and her husband alternate paying house bills and they deny any missed bill payments. She denies missing medications, her husband states she does occasionally miss them. She has been taking Aricept with no clear improvement in her symptoms, no side effects. She had Neuropsych testing in October 2014, which indicated Mild Cognitive Impairment, possible early dementia. Repeat NP testing was recommended.   She denies any headaches, dizziness, diplopia, dysarthria, dysphagia, neck/back pain, focal numbness/tingling/weakness, bowel/bladder dysfunction.  Diagnostic Data: I personally reviewed MRI brain without contrast which did not show any acute changes. There is mild diffuse atrophy and moderate chronic microvascular ischemic changes bilaterally.  PAST MEDICAL HISTORY: Past Medical History  Diagnosis Date  . Diabetes mellitus     type II  . Hyperlipidemia   . Hypertension   . History of hypercalcemia   . Mild cognitive impairment 10/14    Completed neuropsych testing    MEDICATIONS: Current Outpatient Prescriptions on File Prior to Visit  Medication Sig Dispense Refill  . allopurinol (ZYLOPRIM) 100 MG tablet Take 1 tablet (100 mg total) by mouth daily. 90 tablet 1  . aspirin 81 MG tablet Take 81 mg by mouth daily.      Marland Kitchen. donepezil (ARICEPT) 10 MG tablet Take 1/2 tablet at bedtime for 1 week, then 1 tablet at bedtime (Patient taking differently: Take 10 mg by mouth at  bedtime. ) 90 tablet 1  . furosemide (LASIX) 20 MG tablet Take 0.5 tablets (10 mg total) by mouth daily. 30 tablet 2  . metFORMIN (GLUCOPHAGE) 500 MG tablet Take 1 tablet (500 mg total) by mouth 2 (two) times daily with a meal. 180 tablet 1  . Multiple Vitamin (MULTIVITAMIN) tablet Take 1 tablet by mouth daily.      Marland Kitchen. NIFEdipine (PROCARDIA  XL/ADALAT-CC) 90 MG 24 hr tablet Take 1 tablet (90 mg total) by mouth daily. 90 tablet 1  . rosuvastatin (CRESTOR) 20 MG tablet Take 1 tablet (20 mg total) by mouth daily. 90 tablet 1  . sertraline (ZOLOFT) 100 MG tablet Take 1 tablet (100 mg total) by mouth daily. 90 tablet 1  . valsartan (DIOVAN) 320 MG tablet Take 1 tablet (320 mg total) by mouth daily. 30 tablet 2   No current facility-administered medications on file prior to visit.    ALLERGIES: No Known Allergies  FAMILY HISTORY: Family History  Problem Relation Age of Onset  . Cancer Mother     ?colon?  . Heart disease Father   . Kidney disease Father   . Hypertension Father   . Diabetes Paternal Aunt   . Diabetes Paternal Uncle   . Diabetes Paternal Grandmother     SOCIAL HISTORY: History   Social History  . Marital Status: Married    Spouse Name: N/A    Number of Children: 4  . Years of Education: N/A   Occupational History  . 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 Smoking status: Never Smoker   . Smokeless tobacco: Never Used  . Alcohol Use: Yes     Comment: Occasional glass of wine or beer  . Drug Use: Not on file  . Sexual Activity: Not on file   Other Topics Concern  . Not on file   Social History Narrative   Caffeine use:  Coffee daily   Regular exercise:  Not currently. Active at home.    REVIEW OF SYSTEMS: Constitutional: No fevers, chills, or sweats, no generalized fatigue, change in appetite Eyes: No visual changes, double vision, eye pain Ear, nose and throat: No hearing loss, ear pain, nasal congestion, sore throat Cardiovascular: No chest pain, palpitations Respiratory:  No shortness of breath at rest or with exertion, wheezes GastrointestinaI: No nausea, vomiting, diarrhea, abdominal pain, fecal incontinence Genitourinary:  No dysuria, urinary retention or frequency Musculoskeletal:  No neck pain, back pain Integumentary: No rash, pruritus, skin  lesions Neurological: as above Psychiatric: No depression, insomnia, anxiety Endocrine: No palpitations, fatigue, diaphoresis, mood swings, change in appetite, change in weight, increased thirst Hematologic/Lymphatic:  No anemia, purpura, petechiae. Allergic/Immunologic: no itchy/runny eyes, nasal congestion, recent allergic reactions, rashes  PHYSICAL EXAM: Filed Vitals:   06/26/14 0939  BP: 158/92  Pulse: 77  Resp: 16   General: No acute distress Head:  Normocephalic/atraumatic Neck: supple, no paraspinal tenderness, full range of motion Heart:  Regular rate and rhythm Lungs:  Clear to auscultation bilaterally Back: No paraspinal tenderness Skin/Extremities: No rash, no edema Neurological Exam: alert and oriented to person, did not know month, knows year, [place] She is noted to make occasional paraphasic errors with some word-finding difficulties. No dysarthria. Fund of knowledge is appropriate.  Remote memory intact.  Attention and concentration are normal.    Able to name objects and repeat phrases.  MMSE - Mini Mental State Exam 06/26/2014  Orientation to  time 2  Orientation to Place 2  Registration 3  Attention/ Calculation 5  Recall 0  Language- name 2 objects 2  Language- repeat 0  Language- follow 3 step command 3  Language- read & follow direction 1  Write a sentence 1  Copy design 1  Total score 20   Cranial nerves: Pupils equal, round, reactive to light.  Fundoscopic exam unremarkable, no papilledema. Extraocular movements intact with no nystagmus. Visual fields full. Facial sensation intact. No facial asymmetry. Tongue, uvula, palate midline.  Motor: Bulk and tone normal, muscle strength 5/5 throughout with no pronator drift.  Sensation to light touch intact. No extinction to double simultaneous stimulation.  Deep tendon reflexes 2+ throughout, toes downgoing.  Finger to nose testing intact.  Gait narrow-based and steady, able to tandem walk  adequately.  Romberg negative.  IMPRESSION: This is a very pleasant 68 yo RH woman with vascular risk factor including hypertension, hyperlipidemia, diabetes, with worsening memory loss. Neuropsychological evaluation a year ago was consistent with Mild Cognitive Impairment. MRI brain unremarkable. Her MMSE today is 20/30, with note of mild paraphasic errors and occasional word-finding difficulties. Symptoms concerning for mild dementia. She is scheduled for repeat Neuropsych testing next month. Continue Aricept 10mg  daily. We again discussed effects of mood on memory, as well as the importance of physical and brain stimulation exercises, control of vascular risk factors, for brain health. She will follow-up in 6 months.   Thank you for allowing me to participate in her care.  Please do not hesitate to call for any questions or concerns.  The duration of this appointment visit was 15 minutes of face-to-face time with the patient.  Greater than 50% of this time was spent in counseling, explanation of diagnosis, planning of further management, and coordination of care.   Patrcia Dolly, M.D.   CC: Dr. Artist Pais

## 2014-06-26 NOTE — Patient Instructions (Addendum)
1. Continue Aricept 10mg  daily 2. Proceed with Neuropsychological testing as discussed 3. Physical exercise and brain stimulation exercises are important for brain health 4. Follow-up in 6 months

## 2014-06-27 ENCOUNTER — Encounter: Payer: Self-pay | Admitting: Neurology

## 2014-07-31 ENCOUNTER — Ambulatory Visit (INDEPENDENT_AMBULATORY_CARE_PROVIDER_SITE_OTHER): Payer: Medicare Other | Admitting: Neurology

## 2014-07-31 VITALS — BP 150/108 | HR 85 | Resp 18 | Ht 59.0 in | Wt 133.0 lb

## 2014-07-31 DIAGNOSIS — F03B0 Unspecified dementia, moderate, without behavioral disturbance, psychotic disturbance, mood disturbance, and anxiety: Secondary | ICD-10-CM

## 2014-07-31 DIAGNOSIS — F0390 Unspecified dementia without behavioral disturbance: Secondary | ICD-10-CM

## 2014-07-31 MED ORDER — DONEPEZIL HCL 10 MG PO TABS: 10.0000 mg | ORAL_TABLET | Freq: Every day | ORAL | Status: DC

## 2014-07-31 MED ORDER — MEMANTINE HCL ER 7 & 14 & 21 &28 MG PO CP24: ORAL_CAPSULE | ORAL | Status: DC

## 2014-07-31 NOTE — Progress Notes (Signed)
NEUROLOGY FOLLOW UP OFFICE NOTE  Lisa Shaffer [id number]  HISTORY OF PRESENT ILLNESS: I had the pleasure of seeing Lisa Shaffer in follow-up in the neurology clinic on 07/31/2014.  The patient was last seen a month ago and presents for an earlier visit today due to worsening memory changes. She is again accompanied by her husband who provides majority of the history today. She feels she is fine. Her husband reports that she looks at her clinic aftervisit summaries daily. Since her last visit, she would say that someone had called and was coming to visit them, that their children were coming, she would clean up the bedroom and fix the table, then gets upset when no one comes. She would tell her husband she gets phone calls from her mother who is deceased, he has needed to shown her the death certificate to convince her. She tells me that she knows her mother is deceased. On the way here today, she was looking at her papers and told her husband she was looking for her resume to see what she has done.  He states that he asked for an earlier appointment because he couldn't find the bottle of Aricept that I had prescribed on the last visit. She continues to take Aricept 10mg /day prescribed by her PCP. I explained to her husband that I had not prescribed medication and had instructed them to take the Aricept prescribed by Dr. Artist PaisYoo. He shows me duplicates of Valsartan prescribed by 2 different physicians.   As patient was talking about how she knew her mother was deceased, she went on a tangent and started talking about her supervisor and office difficulties that she had.   She denies any headaches, dizziness, diplopia, dysarthria, dysphagia, neck/back pain, focal numbness/tingling/weakness, bowel/bladder dysfunction.  HPI: This is a very pleasant 69 yo RH woman with vascular risk factors including hypertension, diabetes, hyperlipidemia, who presented with worsening memory loss. Symptoms started around 4  years ago, she would forget names, conversations, misplace things. Her husband reports an incident when she was still working in 2012 when she would finish working on a stack of papers, put them in a pile, then go through that same pile. 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 After she retired, she continued to do the same thing of reading something, putting it down, then reading it again. She would ask the same questions repeatedly such as what he wants for breakfast. They started writing down reminders on a calendar, but now she would go back and forth to the calendar reading the same thing as if she is unsure. She has difficulties multitasking. If something is bothering her, symptoms would get worse. She stopped driving after retirement, she states driving makes her feel uneasy, she always wants to make sure she was on the right place on the street. She and her husband alternate paying house bills and they deny any missed bill payments. She denies missing medications, her husband states she does occasionally miss them. She has been taking Aricept with no clear improvement in her symptoms, no side effects. She had Neuropsych testing in October 2014, which indicated Mild Cognitive Impairment, possible early dementia. Repeat NP testing was recommended.   Diagnostic Data: I personally reviewed MRI brain without contrast which did not show any acute changes. There is mild diffuse atrophy and moderate chronic microvascular ischemic changes bilaterally.  PAST MEDICAL HISTORY: Past Medical History  Diagnosis Date  . Diabetes mellitus     type II  . Hyperlipidemia   . Hypertension   . History of hypercalcemia   . Mild cognitive impairment 10/14    Completed neuropsych testing    MEDICATIONS: Current Outpatient Prescriptions on File Prior to Visit  Medication Sig Dispense Refill  .  allopurinol (ZYLOPRIM) 100 MG tablet Take 1 tablet (100 mg total) by mouth daily. 90 tablet 1  . aspirin 81 MG tablet Take 81 mg by mouth daily.      . furosemide (LASIX) 20 MG tablet Take 0.5 tablets (10 mg total) by mouth daily. 30 tablet 2  . metFORMIN (GLUCOPHAGE) 500 MG tablet Take 1 tablet (500 mg total) by mouth 2 (two) times daily with a meal. 180 tablet 1  . Multiple Vitamin (MULTIVITAMIN) tablet Take 1 tablet by mouth daily.      Marland Kitchen NIFEdipine (PROCARDIA XL/ADALAT-CC) 90 MG 24 hr tablet Take 1 tablet (90 mg total) by mouth daily. 90 tablet 1  . rosuvastatin (CRESTOR) 20 MG tablet Take 1 tablet (20 mg total) by mouth daily. 90 tablet 1  . sertraline (ZOLOFT) 100 MG tablet Take 1 tablet (100 mg total) by mouth daily. 90 tablet 1  . valsartan (DIOVAN) 320 MG tablet Take 1 tablet (320 mg total) by mouth daily. 30 tablet 2   No current facility-administered medications on file prior to visit.    ALLERGIES: No Known Allergies  FAMILY HISTORY: Family History  Problem Relation Age of Onset  . Cancer Mother     ?colon?  . Heart disease Father   . Kidney disease Father   . Hypertension Father   . Diabetes Paternal Aunt   . Diabetes Paternal Uncle   . Diabetes Paternal Grandmother     SOCIAL HISTORY: History   Social History  . Marital Status: Married    Spouse Name: N/A    Number of Children: 4  . Years of Education: N/A   Occupational History  . 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 Smoking status: Never Smoker   . Smokeless tobacco: Never Used  . Alcohol Use: Yes     Comment: Occasional glass of wine or beer  . Drug Use: Not on file  . Sexual Activity: Not on file   Other Topics Concern  . Not on file   Social History Narrative   Caffeine use:  Coffee daily   Regular exercise:  Not currently. Active at home.    REVIEW OF SYSTEMS: Constitutional: No fevers, chills, or sweats, no generalized fatigue, change in  appetite Eyes: No visual changes, double vision, eye pain Ear, nose and throat: No hearing loss, ear pain, nasal congestion, sore throat Cardiovascular: No chest pain, palpitations Respiratory:  No shortness of breath at rest or with exertion, wheezes GastrointestinaI: No nausea, vomiting, diarrhea, abdominal pain, fecal incontinence Genitourinary:  No dysuria, urinary retention or frequency Musculoskeletal:  No neck pain, back pain Integumentary: No rash, pruritus, skin lesions Neurological: as above Psychiatric: No depression, insomnia, anxiety Endocrine: No palpitations, fatigue, diaphoresis, mood swings, change in appetite, change in weight, increased thirst Hematologic/Lymphatic:  No anemia, purpura, petechiae. Allergic/Immunologic: no itchy/runny eyes, nasal congestion, recent allergic reactions, rashes  PHYSICAL EXAM: Filed Vitals:   07/31/14 1441  BP: 150/108  Pulse: 85  Resp: 18   General: No acute distress Head:  Normocephalic/atraumatic Neck: supple, no paraspinal tenderness, full  range of motion Heart:  Regular rate and rhythm Lungs:  Clear to auscultation bilaterally Back: No paraspinal tenderness Skin/Extremities: No rash, no edema Neurological Exam: alert and oriented to person, place. Did not know date/month/year. She is noted to make paraphasic errors ("label" for lapel, then said "collar"), word-finding difficulties, no dysarthria. Fund of knowledge is appropriate but perseverates on previous work difficulties.  Recent memory intact.  Attention and concentration are reduced, able to do serial 7s until 79, then started having difficulties.   Able to name objects and repeat phrases.  MMSE - Mini Mental State Exam 07/31/2014 06/26/2014  Orientation to time 0 2  Orientation to Place 2 2  Registration 3 3  Attention/ Calculation 5 5  Recall 0 0  Language- name 2 objects 2 2  Language- repeat 1 0  Language- follow 3 step command 3 3  Language- read & follow  direction 1 1  Write a sentence 1 1  Copy design 1 1  Total score 19 20   Cranial nerves: Pupils equal, round, reactive to light.  Fundoscopic exam unremarkable, no papilledema. Extraocular movements intact with no nystagmus. Visual fields full. Facial sensation intact. No facial asymmetry. Tongue, uvula, palate midline.  Motor: Bulk and tone normal, muscle strength 5/5 throughout with no pronator drift.  Sensation to light touch intact.  No extinction to double simultaneous stimulation.  Deep tendon reflexes 1+ throughout, toes downgoing.  Finger to nose testing intact.  Gait narrow-based and steady, able to tandem walk adequately.  Romberg negative.   IMPRESSION: This is a very pleasant 69 yo RH woman with vascular risk factor including hypertension, hyperlipidemia, diabetes, with worsening memory loss. Neuropsychological evaluation a year ago was consistent with Mild Cognitive Impairment. MRI brain unremarkable. Her MMSE today is 19/30, with note of mild paraphasic errors and occasional word-finding difficulties. Symptoms concerning for mild to moderate dementia. She is scheduled for repeat Neuropsych testing on Monday. Continue Aricept  daily. She is noted to have worsening today, and will start Namenda XR starter pack, side effects were discussed. It appears her husband also gets confused with the medications, I have discussed having home health come to help with medications and he reports they are fine for now. She will follow-up in 5 months.   Thank you for allowing me to participate in her care. Please do not hesitate to call for any questions or concerns.  The duration of this appointment visit was 25 minutes of face-to-face time with the patient.   Patrcia Dolly, M.D.   CC: Dr. Artist Pais

## 2014-07-31 NOTE — Patient Instructions (Signed)
1. Continue Aricept 10mg : Take 1 tablet daily 2. Start Namenda XR starter pack, then after finishing pack, refill Namenda XR 28mg  tablet once a day 3. Proceed with Neuropsychological evaluation with Dr. Leonides CaveZelson on Monday 4. Follow-up in June as scheduled

## 2014-08-01 ENCOUNTER — Encounter: Payer: Self-pay | Admitting: Neurology

## 2014-08-03 ENCOUNTER — Encounter: Payer: Self-pay | Admitting: Family Medicine

## 2014-08-03 ENCOUNTER — Ambulatory Visit (INDEPENDENT_AMBULATORY_CARE_PROVIDER_SITE_OTHER): Payer: Medicare Other | Admitting: Family Medicine

## 2014-08-03 VITALS — BP 136/80 | HR 81 | Temp 97.9°F | Wt 134.0 lb

## 2014-08-03 DIAGNOSIS — I10 Essential (primary) hypertension: Secondary | ICD-10-CM | POA: Diagnosis not present

## 2014-08-03 DIAGNOSIS — E119 Type 2 diabetes mellitus without complications: Secondary | ICD-10-CM

## 2014-08-03 LAB — HEMOGLOBIN A1C: Hgb A1c MFr Bld: 7.3 % — ABNORMAL HIGH (ref 4.6–6.5)
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
## 2014-08-03 NOTE — Progress Notes (Signed)
Pre visit review using our clinic review tool, if applicable. No additional management support is needed unless otherwise documented below in the visit note. 

## 2014-08-06 ENCOUNTER — Telehealth: Payer: Self-pay | Admitting: Internal Medicine

## 2014-08-06 DIAGNOSIS — F0391 Unspecified dementia with behavioral disturbance: Secondary | ICD-10-CM

## 2014-08-06 NOTE — Telephone Encounter (Signed)
emmi emailed °

## 2014-08-21 ENCOUNTER — Telehealth: Payer: Self-pay | Admitting: Family Medicine

## 2014-08-21 NOTE — Telephone Encounter (Signed)
Call to speak with patient's husband to see how patient was doing on Namenda starter pack, before new rx was sent in to her pharmacy. He states that she was doing ok on med & she has 2 more weeks to go on the starter pack. He states that she started taking med later than expected. He states that he had forgot about it until he saw it in patient's medication bag. I told him I would call back in 2 weeks to see if she was still doing well on med before sending in Rx.

## 2014-09-05 ENCOUNTER — Telehealth: Payer: Self-pay | Admitting: Family Medicine

## 2014-09-05 MED ORDER — MEMANTINE HCL ER 28 MG PO CP24: 28.0000 mg | ORAL_CAPSULE | Freq: Every day | ORAL | Status: DC

## 2014-09-05 NOTE — Telephone Encounter (Signed)
I called to speak with patient's husband about Namenda Rx. He states that patient is on her last week of the Namenda sample packet that they were given at her last ov. He said that she had been doing ok on the medication with not issues so far. Told him that I am going to send in Rx to her pharmacy for her to start taking 1 a day when she finishes the rest of her sample pack this week. I did advise for him to give us a call if she has any problems with the medication.

## 2014-09-24 ENCOUNTER — Ambulatory Visit (INDEPENDENT_AMBULATORY_CARE_PROVIDER_SITE_OTHER): Payer: Medicare Other | Admitting: Internal Medicine

## 2014-09-24 ENCOUNTER — Encounter: Payer: Self-pay | Admitting: Internal Medicine

## 2014-09-24 DIAGNOSIS — E213 Hyperparathyroidism, unspecified: Secondary | ICD-10-CM

## 2014-09-24 LAB — MAGNESIUM: MAGNESIUM: 2.1 mg/dL (ref 1.5–2.5)

## 2014-09-24 LAB — BASIC METABOLIC PANEL
BUN: 9 mg/dL (ref 6–23)
CALCIUM: 10 mg/dL (ref 8.4–10.5)
CO2: 28 mEq/L (ref 19–32)
Chloride: 106 mEq/L (ref 96–112)
Creatinine, Ser: 1.09 mg/dL (ref 0.40–1.20)
GFR: 63.98 mL/min (ref >60.00)
Glucose, Bld: 106 mg/dL — ABNORMAL HIGH (ref 70–99)
Potassium: 4 mEq/L (ref 3.5–5.1)
SODIUM: 139 meq/L (ref 135–145)

## 2014-09-24 LAB — PHOSPHORUS: PHOSPHORUS: 3 mg/dL (ref 2.3–4.6)
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
## 2014-09-24 NOTE — Patient Instructions (Signed)
Please stop at the lab.  If the vitamin D level is normal, we can go ahead and collect a 24-hour urine to check your urinary calcium. Please follow the below instructions about how to perform the urine collection: Patient information (Up-to-Date): Collection of a 24-hour urine specimen   - You should collect every drop of urine during each 24-hour period. It does not matter how much or little urine is passed each time, as long as every drop is collected. - Begin the urine collection in the morning after you wake up, after you have emptied your bladder for the first time. - Urinate (empty the bladder) for the first time and flush it down the toilet. Note the exact time (eg, 6:15 AM). You will begin the urine collection at this time. - Collect every drop of urine during the day and night in an empty collection bottle. Store the bottle at room temperature or in the refrigerator. - If you need to have a bowel movement, any urine passed with the bowel movement should be collected. Try not to include feces with the urine collection. If feces does get mixed in, do not try to remove the feces from the urine collection bottle. - Finish by collecting the first urine passed the next morning, adding it to the collection bottle. This should be within ten minutes before or after the time of the first morning void on the first day (which was flushed). In this example, you would try to void between 6:05 and 6:25 on the second day. - If you need to urinate one hour before the final collection time, drink a full glass of water so that you can void again at the appropriate time. If you have to urinate 20 minutes before, try to hold the urine until the proper time. - Please note the exact time of the final collection, even if it is not the same time as when collection began on day 1. - The bottle(s) may be kept at room temperature for a day or two, but should be kept cool or refrigerated for longer periods of  time.  Please come back for a follow-up appointment in 4 months, but we will stay in touch regarding the lab results through my chart.

## 2014-09-25 LAB — VITAMIN D 25 HYDROXY (VIT D DEFICIENCY, FRACTURES): VITD: 23.75 ng/mL — AB (ref 30.00–100.00)

## 2014-09-25 LAB — PARATHYROID HORMONE, INTACT (NO CA): PTH: 72 pg/mL — ABNORMAL HIGH (ref 15–65)

## 2014-09-26 ENCOUNTER — Other Ambulatory Visit: Payer: Self-pay | Admitting: Internal Medicine

## 2014-09-26 DIAGNOSIS — E559 Vitamin D deficiency, unspecified: Secondary | ICD-10-CM | POA: Insufficient documentation

## 2014-09-27 LAB — VITAMIN D 1,25 DIHYDROXY
Vitamin D 1, 25 (OH)2 Total: 80 pg/mL — ABNORMAL HIGH (ref 18–72)
Vitamin D2 1, 25 (OH)2: <8 pg/mL
Vitamin D3 1, 25 (OH)2: 80 pg/mL

## 2014-11-02 ENCOUNTER — Ambulatory Visit (INDEPENDENT_AMBULATORY_CARE_PROVIDER_SITE_OTHER): Payer: Medicare Other | Admitting: Adult Health

## 2014-11-02 ENCOUNTER — Ambulatory Visit: Payer: Medicare Other | Admitting: Internal Medicine

## 2014-11-02 ENCOUNTER — Encounter: Payer: Self-pay | Admitting: Adult Health

## 2014-11-02 VITALS — BP 118/72 | Temp 98.6°F | Wt 122.8 lb

## 2014-11-02 DIAGNOSIS — Z09 Encounter for follow-up examination after completed treatment for conditions other than malignant neoplasm: Secondary | ICD-10-CM | POA: Diagnosis not present

## 2014-11-02 DIAGNOSIS — E119 Type 2 diabetes mellitus without complications: Secondary | ICD-10-CM

## 2014-11-02 NOTE — Patient Instructions (Signed)
It was great meeting you today. I will follow up with you on Monday with the results of your labs. Continue to see your Neurologist and Endocinologist as scheduled.   Only take 10 mg or one half of a pill of your Lasix.   Please follow up in 3 months with Dr. Artist PaisYoo or as needed.

## 2014-11-02 NOTE — Progress Notes (Signed)
   Subjective:    Patient ID: Lisa Shaffer, female    DOB: 03/17/1946, 69 y.o.   MRN: [id number]  HPI  Lisa Shaffer presents to the office today for her three month follow-up. She is a patient of Dr. Artist PaisYoo and is seen by me today in his absence.She has a history hypertension, type 2 diabetes and memory loss. In addition to Dr. Artist PaisYoo is she followed by Dr. Elvera LennoxGherghe ( Endo) and Dr. Karel JarvisAquino ( Neuro). She has no complaints today. Her husband who accompanied her states that she " is becoming very repetitive".   Denies any SOB, CP, N/V/D. Is tolerating all medication well.     She and her husband both endorse well controlled blood sugars and hypertension.   There did seem to be some confusion on the amount of Lasix she should be taking. It appears as though he was giving her 20 mg instead of 10mg .   Review of Systems  Psychiatric/Behavioral: Positive for confusion.  All other systems reviewed and are negative.      Objective:   Physical Exam  Constitutional: She is oriented to person, place, and time. She appears well-developed and well-nourished. No distress.  HENT:  Head: Normocephalic and atraumatic.  Right Ear: External ear normal.  Left Ear: External ear normal.  Eyes: Right eye exhibits no discharge. Left eye exhibits no discharge.  Neck: Normal range of motion. No thyromegaly present.  Cardiovascular: Normal rate, regular rhythm, normal heart sounds and intact distal pulses.  Exam reveals no gallop and no friction rub.   No murmur heard. Pulmonary/Chest: Effort normal and breath sounds normal. No respiratory distress. She has no wheezes. She has no rales. She exhibits no tenderness.  Abdominal: Soft. Bowel sounds are normal. She exhibits no distension and no mass. There is no tenderness. There is no rebound and no guarding.  Lymphadenopathy:    She has no cervical adenopathy.  Neurological: She is alert and oriented to person, place, and time. No cranial nerve deficit. Coordination  normal.  Skin: Skin is warm and dry. No rash noted. No erythema. No pallor.  Psychiatric: She has a normal mood and affect. Her behavior is normal.  She does appear to have word finding issues. Also appears to become confused at times. She is able to answer some simple questions without difficulty  Vitals reviewed.      Assessment & Plan:  1. Follow-up exam, 3-6 months since previous exam - Reiterated that she should take 10 mg of Lasix - Follow up with Dr. Artist PaisYoo in 3 months or sooner if needed  2. Type 2 diabetes mellitus without complication  - Hemoglobin A1c - Basic metabolic panel

## 2014-11-05 LAB — BASIC METABOLIC PANEL
BUN: 16 mg/dL (ref 6–23)
CALCIUM: 10 mg/dL (ref 8.4–10.5)
CO2: 25 mEq/L (ref 19–32)
Chloride: 106 mEq/L (ref 96–112)
Creatinine, Ser: 1.23 mg/dL — ABNORMAL HIGH (ref 0.40–1.20)
GFR: 55.64 mL/min — AB (ref >60.00)
GLUCOSE: 129 mg/dL — AB (ref 70–99)
Potassium: 3.6 mEq/L (ref 3.5–5.1)
SODIUM: 139 meq/L (ref 135–145)

## 2014-11-05 LAB — HEMOGLOBIN A1C: Hgb A1c MFr Bld: 6.2 % (ref 4.6–6.5)

## 2014-11-06 ENCOUNTER — Other Ambulatory Visit: Payer: Self-pay | Admitting: Adult Health

## 2014-11-06 DIAGNOSIS — R944 Abnormal results of kidney function studies: Secondary | ICD-10-CM

## 2014-11-11 ENCOUNTER — Other Ambulatory Visit: Payer: Self-pay | Admitting: Internal Medicine

## 2014-11-12 ENCOUNTER — Other Ambulatory Visit (INDEPENDENT_AMBULATORY_CARE_PROVIDER_SITE_OTHER): Payer: Medicare Other

## 2014-11-12 DIAGNOSIS — R944 Abnormal results of kidney function studies: Secondary | ICD-10-CM | POA: Diagnosis not present

## 2014-11-12 LAB — BASIC METABOLIC PANEL
BUN: 12 mg/dL (ref 6–23)
CALCIUM: 9.8 mg/dL (ref 8.4–10.5)
CHLORIDE: 104 meq/L (ref 96–112)
CO2: 26 meq/L (ref 19–32)
CREATININE: 1.08 mg/dL (ref 0.40–1.20)
GFR: 64.64 mL/min (ref >60.00)
Glucose, Bld: 142 mg/dL — ABNORMAL HIGH (ref 70–99)
Potassium: 3.7 mEq/L (ref 3.5–5.1)
SODIUM: 138 meq/L (ref 135–145)

## 2014-11-12 LAB — MICROALBUMIN / CREATININE URINE RATIO
Creatinine,U: 154.9 mg/dL
MICROALB/CREAT RATIO: 5.6 mg/g (ref 0.0–30.0)
Microalb, Ur: 8.7 mg/dL — ABNORMAL HIGH (ref 0.0–1.9)

## 2014-11-13 ENCOUNTER — Other Ambulatory Visit: Payer: Self-pay | Admitting: Adult Health

## 2014-11-13 ENCOUNTER — Telehealth: Payer: Self-pay | Admitting: Adult Health

## 2014-11-13 NOTE — Telephone Encounter (Signed)
Disregard previous message, she is covered with an ARB

## 2014-11-13 NOTE — Telephone Encounter (Signed)
Noted  

## 2014-12-11 ENCOUNTER — Other Ambulatory Visit: Payer: Self-pay | Admitting: Internal Medicine

## 2014-12-26 ENCOUNTER — Ambulatory Visit: Payer: Medicare Other | Admitting: Neurology

## 2014-12-31 ENCOUNTER — Encounter: Payer: Self-pay | Admitting: Neurology

## 2014-12-31 ENCOUNTER — Ambulatory Visit (INDEPENDENT_AMBULATORY_CARE_PROVIDER_SITE_OTHER): Payer: Medicare Other | Admitting: Neurology

## 2014-12-31 VITALS — BP 110/70 | HR 69 | Resp 16 | Ht 59.5 in | Wt 127.0 lb

## 2014-12-31 DIAGNOSIS — F03B0 Unspecified dementia, moderate, without behavioral disturbance, psychotic disturbance, mood disturbance, and anxiety: Secondary | ICD-10-CM | POA: Insufficient documentation

## 2014-12-31 DIAGNOSIS — F0391 Unspecified dementia with behavioral disturbance: Secondary | ICD-10-CM | POA: Diagnosis not present

## 2014-12-31 DIAGNOSIS — F0390 Unspecified dementia without behavioral disturbance: Secondary | ICD-10-CM | POA: Insufficient documentation

## 2014-12-31 DIAGNOSIS — F03B18 Unspecified dementia, moderate, with other behavioral disturbance: Secondary | ICD-10-CM

## 2014-12-31 MED ORDER — QUETIAPINE FUMARATE 25 MG PO TABS: ORAL_TABLET | ORAL | Status: DC

## 2014-12-31 MED ORDER — MEMANTINE HCL ER 28 MG PO CP24: 28.0000 mg | ORAL_CAPSULE | Freq: Every day | ORAL | Status: AC

## 2014-12-31 MED ORDER — DONEPEZIL HCL 10 MG PO TABS: 10.0000 mg | ORAL_TABLET | Freq: Every day | ORAL | Status: DC

## 2014-12-31 NOTE — Progress Notes (Signed)
NEUROLOGY FOLLOW UP OFFICE NOTE  Lisa Shaffer [id number]  HISTORY OF PRESENT ILLNESS: I had the pleasure of seeing Lisa Shaffer in follow-up in the neurology clinic on 12/31/2014.  The patient was last seen 5 months ago for mild to moderate dementia. She started Namenda XR in addition to Aricept 10mg /day. She is again accompanied by her husband who helps supplement the history today.  She is tolerating her medications without side effects. Her husband reports that when she is keeping busy, she is doing fine, so he tries to get her out of the house more. No difficulties with ADLs. She occasionally confuses her husband with her deceased father, or asks her husband where her husband is. Lately at night, she would think that her parents are still living and she would go from room to room looking for them. One time she was standing at the front door looking for her parents. She does not wander outside the house. Her husband has done it previously in the past where he would show her their death certificates or bring her to the cemetery. He needs to remind her to take her medications. She fell a month ago in the mall when her hat flew off, hitting the left hip, no head injuries. She denies any headaches, dizziness, diplopia, dysarthria, dysphagia, neck/back pain, focal numbness/tingling/weakness, bowel/bladder dysfunction.  HPI: This is a very pleasant 69 yo RH woman with vascular risk factors including hypertension, diabetes, hyperlipidemia, who presented with worsening memory loss. Symptoms started around 4 years ago, she would forget names, conversations, misplace things. Her husband reports an incident when she was still working in 2012 when she would finish working on a stack of papers, put them in a pile, then go through that same pile. 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 After she  retired, she continued to do the same thing of reading something, putting it down, then reading it again. She would ask the same questions repeatedly such as what he wants for breakfast. They started writing down reminders on a calendar, but now she would go back and forth to the calendar reading the same thing as if she is unsure. She has difficulties multitasking. If something is bothering her, symptoms would get worse. She stopped driving after retirement, she states driving makes her feel uneasy, she always wants to make sure she was on the right place on the street. She and her husband alternate paying house bills and they deny any missed bill payments. She denies missing medications, her husband states she does occasionally miss them. She has been taking Aricept with no clear improvement in her symptoms, no side effects. She had Neuropsych testing in October 2014, which indicated Mild Cognitive Impairment, possible early dementia. Repeat NP testing was recommended. Her husband reported worsening of memory in January 2016, with MMSE of 19/30.  Diagnostic Data: I personally reviewed MRI brain without contrast which did not show any acute changes. There is mild diffuse atrophy and moderate chronic microvascular ischemic changes bilaterally.  PAST MEDICAL HISTORY: Past Medical History  Diagnosis Date  . Diabetes mellitus     type II  . Hyperlipidemia   . Hypertension   . History of hypercalcemia   . Mild cognitive impairment 10/14    Completed neuropsych testing    MEDICATIONS: Current Outpatient Prescriptions on File Prior to Visit  Medication Sig  Dispense Refill  . aspirin 81 MG tablet Take 81 mg by mouth daily.      Marland Kitchen donepezil (ARICEPT) 10 MG tablet Take 1 tablet (10 mg total) by mouth at bedtime. 90 tablet 1  . furosemide (LASIX) 20 MG tablet Take 0.5 tablets (10 mg total) by mouth daily. 30 tablet 2  . memantine (NAMENDA XR) 28 MG CP24 24 hr capsule Take 1 capsule (28 mg total) by  mouth daily. 30 capsule 4  . metFORMIN (GLUCOPHAGE) 500 MG tablet Take 1 tablet (500 mg total) by mouth 2 (two) times daily with a meal. 180 tablet 1  . Multiple Vitamin (MULTIVITAMIN) tablet Take 1 tablet by mouth daily.      Marland Kitchen NIFEdipine (PROCARDIA XL/ADALAT-CC) 90 MG 24 hr tablet Take 1 tablet (90 mg total) by mouth daily. 90 tablet 1  . rosuvastatin (CRESTOR) 20 MG tablet Take 1 tablet (20 mg total) by mouth daily. 90 tablet 1  . sertraline (ZOLOFT) 100 MG tablet Take 1 tablet (100 mg total) by mouth daily. 90 tablet 1  . valsartan (DIOVAN) 320 MG tablet TAKE 1 TABLET BY MOUTH DAILY 30 tablet 2   No current facility-administered medications on file prior to visit.    ALLERGIES: No Known Allergies  FAMILY HISTORY: Family History  Problem Relation Age of Onset  . Cancer Mother     ?colon?  . Heart disease Father   . Kidney disease Father   . Hypertension Father   . Diabetes Paternal Aunt   . Diabetes Paternal Uncle   . Diabetes Paternal Grandmother     SOCIAL HISTORY: History   Social History  . Marital Status: Married    Spouse Name: N/A  . Number of Children: 4  . Years of Education: N/A   Occupational History  . 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 Smoking status: Never Smoker   . Smokeless tobacco: Never Used  . Alcohol Use: Yes     Comment: Occasional glass of wine or beer  . Drug Use: Not on file  . Sexual Activity: Not on file   Other Topics Concern  . Not on file   Social History Narrative   Caffeine use:  Coffee daily   Regular exercise:  Not currently. Active at home.    REVIEW OF SYSTEMS: Constitutional: No fevers, chills, or sweats, no generalized fatigue, change in appetite Eyes: No visual changes, double vision, eye pain Ear, nose and throat: No hearing loss, ear pain, nasal congestion, sore throat Cardiovascular: No chest pain, palpitations Respiratory:  No shortness of breath at rest or with  exertion, wheezes GastrointestinaI: No nausea, vomiting, diarrhea, abdominal pain, fecal incontinence Genitourinary:  No dysuria, urinary retention or frequency Musculoskeletal:  No neck pain, back pain Integumentary: No rash, pruritus, skin lesions Neurological: as above Psychiatric: No depression, insomnia, anxiety Endocrine: No palpitations, fatigue, diaphoresis, mood swings, change in appetite, change in weight, increased thirst Hematologic/Lymphatic:  No anemia, purpura, petechiae. Allergic/Immunologic: no itchy/runny eyes, nasal congestion, recent allergic reactions, rashes  PHYSICAL EXAM: Filed Vitals:   12/31/14 0935  BP: 110/70  Pulse: 69  Resp: 16   General: No acute distress Head:  Normocephalic/atraumatic Neck: supple, no paraspinal tenderness, full range of motion Heart:  Regular rate and rhythm Lungs:  Clear to auscultation bilaterally Back: No paraspinal tenderness Skin/Extremities: No rash, no edema Neurological Exam: alert and oriented to person, place, and month/year. She does not know date or  day of week. Knows the president. No aphasia or dysarthria. Fund of knowledge is appropriate.  Remote memory intact. 0/3 delayed recall.  Attention and concentration are normal.    Able to name objects and repeat phrases. Cranial nerves: Pupils equal, round, reactive to light.  Fundoscopic exam unremarkable, no papilledema. Extraocular movements intact with no nystagmus. Visual fields full. Facial sensation intact. No facial asymmetry. Tongue, uvula, palate midline.  Motor: Bulk and tone normal, muscle strength 5/5 throughout with no pronator drift.  Sensation to light touch intact.  No extinction to double simultaneous stimulation.  Deep tendon reflexes 2+ throughout, toes downgoing.  Finger to nose testing intact.  Gait narrow-based and steady, able to tandem walk adequately.  Romberg negative.  IMPRESSION: This is a very pleasant 69 yo RH woman with vascular risk factor  including hypertension, hyperlipidemia, diabetes, with worsening memory loss. Neuropsychological evaluation in 2015 was consistent with Mild Cognitive Impairment. MRI brain unremarkable. She has had worsening of memory, with note of mild paraphasic errors and occasional word-finding difficulties. She is also having delusions that her parents are still alive. No significant paranoia. Symptoms concerning for mild to moderate dementia. Continue Aricept and Namenda XR, refills sent today. We discussed the delusions, mostly at night, concerning as well for sundowning. She will try prn Seroquel 12.5mg  qhs for increased confusional episodes. Side effects were discussed. We again discussed home safety and having home health come to help with medications and he reports they are fine for now. She will follow-up in 6 months or earlier if needed.   Thank you for allowing me to participate in her care.  Please do not hesitate to call for any questions or concerns.  The duration of this appointment visit was 15 minutes of face-to-face time with the patient.  Greater than 50% of this time was spent in counseling, explanation of diagnosis, planning of further management, and coordination of care.   Lisa Shaffer, M.D.   CC: Dr. Artist Pais

## 2014-12-31 NOTE — Patient Instructions (Addendum)
1. Continue Aricept  daily 2. Continue Namenda XR  daily 3. Take Seroquel : 1/2 tablet as needed at bedtime for worsening confusion 4. Follow-up in 6 months

## 2015-01-04 ENCOUNTER — Other Ambulatory Visit: Payer: Self-pay | Admitting: Internal Medicine

## 2015-01-16 ENCOUNTER — Other Ambulatory Visit: Payer: Self-pay | Admitting: Internal Medicine

## 2015-01-17 MED ORDER — METFORMIN HCL 500 MG PO TABS: 500.0000 mg | ORAL_TABLET | Freq: Two times a day (BID) | ORAL | Status: DC

## 2015-01-17 NOTE — Telephone Encounter (Signed)
Per PCP, I have not seen pt for DM.

## 2015-01-17 NOTE — Addendum Note (Signed)
Addended by: Kern ReapVEREEN, Charitie Hinote B on: 01/17/2015 10:29 AM   Modules accepted: Orders

## 2015-01-24 ENCOUNTER — Encounter: Payer: Self-pay | Admitting: Internal Medicine

## 2015-01-24 ENCOUNTER — Ambulatory Visit (INDEPENDENT_AMBULATORY_CARE_PROVIDER_SITE_OTHER): Payer: Medicare Other | Admitting: Internal Medicine

## 2015-01-24 DIAGNOSIS — E213 Hyperparathyroidism, unspecified: Secondary | ICD-10-CM

## 2015-01-24 DIAGNOSIS — E559 Vitamin D deficiency, unspecified: Secondary | ICD-10-CM | POA: Diagnosis not present

## 2015-01-24 LAB — VITAMIN D 25 HYDROXY (VIT D DEFICIENCY, FRACTURES): VITD: 37.52 ng/mL (ref 30.00–100.00)
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
## 2015-01-24 NOTE — Patient Instructions (Signed)
Please stop at the lab.  Please collect a 24h urine sample. Patient information (Up-to-Date): Collection of a 24-hour urine specimen   - You should collect every drop of urine during each 24-hour period. It does not matter how much or little urine is passed each time, as long as every drop is collected. - Begin the urine collection in the morning after you wake up, after you have emptied your bladder for the first time. - Urinate (empty the bladder) for the first time and flush it down the toilet. Note the exact time (eg, 6:15 AM). You will begin the urine collection at this time. - Collect every drop of urine during the day and night in an empty collection bottle. Store the bottle at room temperature or in the refrigerator. - If you need to have a bowel movement, any urine passed with the bowel movement should be collected. Try not to include feces with the urine collection. If feces does get mixed in, do not try to remove the feces from the urine collection bottle. - Finish by collecting the first urine passed the next morning, adding it to the collection bottle. This should be within ten minutes before or after the time of the first morning void on the first day (which was flushed). In this example, you would try to void between 6:05 and 6:25 on the second day. - If you need to urinate one hour before the final collection time, drink a full glass of water so that you can void again at the appropriate time. If you have to urinate 20 minutes before, try to hold the urine until the proper time. - Please note the exact time of the final collection, even if it is not the same time as when collection began on day 1. - The bottle(s) may be kept at room temperature for a day or two, but should be kept cool or refrigerated for longer periods of time.  Please come back for a follow-up appointment in 6 months

## 2015-01-28 ENCOUNTER — Other Ambulatory Visit: Payer: Medicare Other

## 2015-01-28 DIAGNOSIS — E213 Hyperparathyroidism, unspecified: Secondary | ICD-10-CM | POA: Diagnosis not present

## 2015-01-29 ENCOUNTER — Other Ambulatory Visit: Payer: Self-pay | Admitting: Internal Medicine

## 2015-01-29 LAB — CALCIUM, URINE, 24 HOUR
CALCIUM UR: 5 mg/dL
Calcium, 24 hour urine: 115 mg/d (ref 100–250)

## 2015-01-29 LAB — CREATININE, URINE, 24 HOUR
Creatinine, 24H Ur: 1058 mg/d (ref 700–1800)
Creatinine, Urine: 46 mg/dL

## 2015-02-01 ENCOUNTER — Ambulatory Visit: Payer: Medicare Other | Admitting: Internal Medicine

## 2015-02-11 ENCOUNTER — Ambulatory Visit (INDEPENDENT_AMBULATORY_CARE_PROVIDER_SITE_OTHER): Payer: Medicare Other | Admitting: Internal Medicine

## 2015-02-11 ENCOUNTER — Encounter: Payer: Self-pay | Admitting: Internal Medicine

## 2015-02-11 VITALS — BP 134/82 | Temp 97.8°F | Ht 59.5 in | Wt 128.2 lb

## 2015-02-11 DIAGNOSIS — E119 Type 2 diabetes mellitus without complications: Secondary | ICD-10-CM

## 2015-02-11 DIAGNOSIS — E785 Hyperlipidemia, unspecified: Secondary | ICD-10-CM | POA: Diagnosis not present

## 2015-02-11 DIAGNOSIS — F03B0 Unspecified dementia, moderate, without behavioral disturbance, psychotic disturbance, mood disturbance, and anxiety: Secondary | ICD-10-CM

## 2015-02-11 DIAGNOSIS — I10 Essential (primary) hypertension: Secondary | ICD-10-CM | POA: Diagnosis not present

## 2015-02-11 DIAGNOSIS — F0390 Unspecified dementia without behavioral disturbance: Secondary | ICD-10-CM | POA: Diagnosis not present

## 2015-02-11 MED ORDER — SIMVASTATIN 20 MG PO TABS: 20.0000 mg | ORAL_TABLET | Freq: Every day | ORAL | Status: DC

## 2015-02-11 MED ORDER — NIFEDIPINE ER OSMOTIC RELEASE 90 MG PO TB24: 90.0000 mg | ORAL_TABLET | Freq: Every day | ORAL | Status: AC

## 2015-02-11 MED ORDER — VALSARTAN 320 MG PO TABS: 320.0000 mg | ORAL_TABLET | Freq: Every day | ORAL | Status: DC
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
## 2015-02-11 NOTE — Progress Notes (Signed)
Pre visit review using our clinic review tool, if applicable. No additional management support is needed unless otherwise documented below in the visit note. 

## 2015-02-12 LAB — LIPID PANEL
CHOLESTEROL: 172 mg/dL (ref 0–200)
HDL: 59.2 mg/dL (ref >39.00)
LDL CALC: 80 mg/dL (ref 0–99)
NonHDL: 113.08
Total CHOL/HDL Ratio: 3
Triglycerides: 164 mg/dL — ABNORMAL HIGH (ref 0.0–149.0)
VLDL: 32.8 mg/dL (ref 0.0–40.0)

## 2015-02-12 LAB — HEPATIC FUNCTION PANEL
ALT: 20 U/L (ref 0–35)
AST: 22 U/L (ref 0–37)
Albumin: 4.5 g/dL (ref 3.5–5.2)
Alkaline Phosphatase: 70 U/L (ref 39–117)
BILIRUBIN DIRECT: 0 mg/dL (ref 0.0–0.3)
TOTAL PROTEIN: 8 g/dL (ref 6.0–8.3)
Total Bilirubin: 0.4 mg/dL (ref 0.2–1.2)

## 2015-02-12 LAB — TSH: TSH: 1.31 u[IU]/mL (ref 0.35–4.50)

## 2015-02-12 LAB — T4, FREE: Free T4: 0.78 ng/dL (ref 0.60–1.60)

## 2015-02-13 ENCOUNTER — Other Ambulatory Visit: Payer: Self-pay | Admitting: Internal Medicine

## 2015-02-18 ENCOUNTER — Other Ambulatory Visit: Payer: Self-pay | Admitting: *Deleted

## 2015-02-19 MED ORDER — FUROSEMIDE 20 MG PO TABS: 10.0000 mg | ORAL_TABLET | Freq: Every day | ORAL | Status: DC

## 2015-02-23 ENCOUNTER — Other Ambulatory Visit: Payer: Self-pay | Admitting: Neurology

## 2015-03-25 DIAGNOSIS — H40003 Preglaucoma, unspecified, bilateral: Secondary | ICD-10-CM | POA: Diagnosis not present

## 2015-03-25 DIAGNOSIS — E11329 Type 2 diabetes mellitus with mild nonproliferative diabetic retinopathy without macular edema: Secondary | ICD-10-CM | POA: Diagnosis not present

## 2015-03-25 DIAGNOSIS — H527 Unspecified disorder of refraction: Secondary | ICD-10-CM | POA: Diagnosis not present

## 2015-03-25 DIAGNOSIS — Z961 Presence of intraocular lens: Secondary | ICD-10-CM | POA: Diagnosis not present

## 2015-05-08 ENCOUNTER — Other Ambulatory Visit: Payer: Self-pay | Admitting: Internal Medicine

## 2015-05-15 ENCOUNTER — Ambulatory Visit: Payer: Self-pay | Admitting: Internal Medicine

## 2015-06-07 ENCOUNTER — Encounter (HOSPITAL_BASED_OUTPATIENT_CLINIC_OR_DEPARTMENT_OTHER): Payer: Self-pay | Admitting: *Deleted

## 2015-06-07 ENCOUNTER — Emergency Department (HOSPITAL_BASED_OUTPATIENT_CLINIC_OR_DEPARTMENT_OTHER): Payer: Medicare Other

## 2015-06-07 ENCOUNTER — Emergency Department (HOSPITAL_BASED_OUTPATIENT_CLINIC_OR_DEPARTMENT_OTHER)
Admission: EM | Admit: 2015-06-07 | Discharge: 2015-06-07 | Disposition: A | Discharge status: Home / Self Care | Payer: Medicare Other | Attending: Emergency Medicine | Admitting: Emergency Medicine

## 2015-06-07 DIAGNOSIS — Z7982 Long term (current) use of aspirin: Secondary | ICD-10-CM | POA: Diagnosis not present

## 2015-06-07 DIAGNOSIS — Y9289 Other specified places as the place of occurrence of the external cause: Secondary | ICD-10-CM | POA: Insufficient documentation

## 2015-06-07 DIAGNOSIS — M545 Low back pain, unspecified: Secondary | ICD-10-CM

## 2015-06-07 DIAGNOSIS — I10 Essential (primary) hypertension: Secondary | ICD-10-CM | POA: Diagnosis not present

## 2015-06-07 DIAGNOSIS — E119 Type 2 diabetes mellitus without complications: Secondary | ICD-10-CM | POA: Diagnosis not present

## 2015-06-07 DIAGNOSIS — E785 Hyperlipidemia, unspecified: Secondary | ICD-10-CM | POA: Diagnosis not present

## 2015-06-07 DIAGNOSIS — W010XXA Fall on same level from slipping, tripping and stumbling without subsequent striking against object, initial encounter: Secondary | ICD-10-CM | POA: Insufficient documentation

## 2015-06-07 DIAGNOSIS — F0390 Unspecified dementia without behavioral disturbance: Secondary | ICD-10-CM | POA: Insufficient documentation

## 2015-06-07 DIAGNOSIS — S79912A Unspecified injury of left hip, initial encounter: Secondary | ICD-10-CM | POA: Diagnosis present

## 2015-06-07 DIAGNOSIS — Z79899 Other long term (current) drug therapy: Secondary | ICD-10-CM | POA: Insufficient documentation

## 2015-06-07 DIAGNOSIS — Y998 Other external cause status: Secondary | ICD-10-CM | POA: Insufficient documentation

## 2015-06-07 DIAGNOSIS — S3992XA Unspecified injury of lower back, initial encounter: Secondary | ICD-10-CM | POA: Diagnosis not present

## 2015-06-07 DIAGNOSIS — Y9389 Activity, other specified: Secondary | ICD-10-CM | POA: Insufficient documentation

## 2015-06-07 DIAGNOSIS — M25552 Pain in left hip: Secondary | ICD-10-CM | POA: Diagnosis not present

## 2015-06-07 NOTE — ED Notes (Signed)
She tripped 2 days ago. Injury to her left hip.

## 2015-06-07 NOTE — Discharge Instructions (Signed)
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
## 2015-06-24 ENCOUNTER — Encounter: Payer: Self-pay | Admitting: Family Medicine

## 2015-06-24 ENCOUNTER — Ambulatory Visit (INDEPENDENT_AMBULATORY_CARE_PROVIDER_SITE_OTHER): Payer: Medicare Other | Admitting: Family Medicine

## 2015-06-24 VITALS — BP 136/84 | HR 71 | Temp 97.9°F | Ht 60.0 in | Wt 135.1 lb

## 2015-06-24 DIAGNOSIS — Z23 Encounter for immunization: Secondary | ICD-10-CM | POA: Diagnosis not present

## 2015-06-24 DIAGNOSIS — M25552 Pain in left hip: Secondary | ICD-10-CM

## 2015-06-24 NOTE — Progress Notes (Signed)
Pre visit review using our clinic review tool, if applicable. No additional management support is needed unless otherwise documented below in the visit note. 
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
## 2015-07-03 ENCOUNTER — Other Ambulatory Visit: Payer: Self-pay | Admitting: Neurology

## 2015-07-04 ENCOUNTER — Telehealth: Payer: Self-pay | Admitting: Neurology

## 2015-07-04 ENCOUNTER — Telehealth: Payer: Self-pay | Admitting: Family Medicine

## 2015-07-04 ENCOUNTER — Other Ambulatory Visit: Payer: Self-pay | Admitting: Internal Medicine

## 2015-07-04 DIAGNOSIS — I638 Other cerebral infarction: Secondary | ICD-10-CM | POA: Diagnosis not present

## 2015-07-04 DIAGNOSIS — R29707 NIHSS score 7: Secondary | ICD-10-CM | POA: Diagnosis not present

## 2015-07-04 DIAGNOSIS — S79911A Unspecified injury of right hip, initial encounter: Secondary | ICD-10-CM | POA: Diagnosis not present

## 2015-07-04 DIAGNOSIS — E785 Hyperlipidemia, unspecified: Secondary | ICD-10-CM | POA: Diagnosis not present

## 2015-07-04 DIAGNOSIS — I10 Essential (primary) hypertension: Secondary | ICD-10-CM | POA: Diagnosis not present

## 2015-07-04 DIAGNOSIS — E119 Type 2 diabetes mellitus without complications: Secondary | ICD-10-CM | POA: Diagnosis not present

## 2015-07-04 DIAGNOSIS — R339 Retention of urine, unspecified: Secondary | ICD-10-CM | POA: Diagnosis not present

## 2015-07-04 DIAGNOSIS — M25552 Pain in left hip: Secondary | ICD-10-CM | POA: Diagnosis not present

## 2015-07-04 DIAGNOSIS — R531 Weakness: Secondary | ICD-10-CM | POA: Diagnosis not present

## 2015-07-04 DIAGNOSIS — R471 Dysarthria and anarthria: Secondary | ICD-10-CM | POA: Diagnosis not present

## 2015-07-04 DIAGNOSIS — R29810 Facial weakness: Secondary | ICD-10-CM | POA: Diagnosis not present

## 2015-07-04 DIAGNOSIS — G8194 Hemiplegia, unspecified affecting left nondominant side: Secondary | ICD-10-CM | POA: Diagnosis not present

## 2015-07-04 DIAGNOSIS — Z794 Long term (current) use of insulin: Secondary | ICD-10-CM | POA: Diagnosis not present

## 2015-07-04 DIAGNOSIS — I672 Cerebral atherosclerosis: Secondary | ICD-10-CM | POA: Diagnosis not present

## 2015-07-04 DIAGNOSIS — I639 Cerebral infarction, unspecified: Secondary | ICD-10-CM | POA: Diagnosis not present

## 2015-07-04 DIAGNOSIS — W19XXXA Unspecified fall, initial encounter: Secondary | ICD-10-CM | POA: Diagnosis not present

## 2015-07-04 DIAGNOSIS — F0390 Unspecified dementia without behavioral disturbance: Secondary | ICD-10-CM | POA: Diagnosis not present

## 2015-07-04 DIAGNOSIS — R402411 Glasgow coma scale score 13-15, in the field [EMT or ambulance]: Secondary | ICD-10-CM | POA: Diagnosis not present

## 2015-07-04 DIAGNOSIS — Z7982 Long term (current) use of aspirin: Secondary | ICD-10-CM | POA: Diagnosis not present

## 2015-07-04 NOTE — Telephone Encounter (Signed)
Patient's husband walked in the office this afternoon around 4:25 pm asking for a wheel chair Rx for his wife. He stated that he thought she had a stroke & is having difficulty moving. He states that patient told him she fell in the bathroom on Tuesday night and he noticed her decreased mobility yesterday. At the time of our discussion in the office the patient was at home by herself. He states that patient hasn't been evaluated for her sxs and they he wanted the wheelchair because he knew that he wasn't going to be able to carry her around. I advised him that patient needed to be taken to the ER ASAP, also advised that when he got home if there wasn't anyone in the home or near by that could help him get her in the car that he needed to call EMS so patient could be transported to the hospital by them. He verbalized good understanding. Patient is on the schedule for a f/u tomorrow with Dr. Karel JarvisAquino @ 4.   Dr. Karel JarvisAquino was made aware of this conversation and agreed with advisement.

## 2015-07-04 NOTE — Telephone Encounter (Signed)
PT's husband Molly MaduroRobert called in regards to getting a prescription for a wheelchair/Dawn CB# [phone]

## 2015-07-05 ENCOUNTER — Ambulatory Visit: Payer: Medicare Other | Admitting: Neurology

## 2015-07-05 NOTE — Telephone Encounter (Signed)
See previous phone note.  

## 2015-07-09 ENCOUNTER — Ambulatory Visit: Payer: Medicare Other | Admitting: Neurology

## 2015-07-11 DIAGNOSIS — R1312 Dysphagia, oropharyngeal phase: Secondary | ICD-10-CM | POA: Diagnosis not present

## 2015-07-11 DIAGNOSIS — I639 Cerebral infarction, unspecified: Secondary | ICD-10-CM | POA: Diagnosis not present

## 2015-07-11 DIAGNOSIS — I69322 Dysarthria following cerebral infarction: Secondary | ICD-10-CM | POA: Diagnosis not present

## 2015-07-11 DIAGNOSIS — E785 Hyperlipidemia, unspecified: Secondary | ICD-10-CM | POA: Diagnosis not present

## 2015-07-11 DIAGNOSIS — R339 Retention of urine, unspecified: Secondary | ICD-10-CM | POA: Diagnosis not present

## 2015-07-11 DIAGNOSIS — E119 Type 2 diabetes mellitus without complications: Secondary | ICD-10-CM | POA: Diagnosis not present

## 2015-07-11 DIAGNOSIS — I69391 Dysphagia following cerebral infarction: Secondary | ICD-10-CM | POA: Diagnosis not present

## 2015-07-11 DIAGNOSIS — W19XXXA Unspecified fall, initial encounter: Secondary | ICD-10-CM | POA: Diagnosis not present

## 2015-07-11 DIAGNOSIS — R1311 Dysphagia, oral phase: Secondary | ICD-10-CM | POA: Diagnosis not present

## 2015-07-11 DIAGNOSIS — R29818 Other symptoms and signs involving the nervous system: Secondary | ICD-10-CM | POA: Diagnosis not present

## 2015-07-11 DIAGNOSIS — G9389 Other specified disorders of brain: Secondary | ICD-10-CM | POA: Diagnosis not present

## 2015-07-11 DIAGNOSIS — I6350 Cerebral infarction due to unspecified occlusion or stenosis of unspecified cerebral artery: Secondary | ICD-10-CM | POA: Diagnosis not present

## 2015-07-11 DIAGNOSIS — Z794 Long term (current) use of insulin: Secondary | ICD-10-CM | POA: Diagnosis not present

## 2015-07-11 DIAGNOSIS — I69354 Hemiplegia and hemiparesis following cerebral infarction affecting left non-dominant side: Secondary | ICD-10-CM | POA: Diagnosis not present

## 2015-07-11 DIAGNOSIS — R197 Diarrhea, unspecified: Secondary | ICD-10-CM | POA: Diagnosis not present

## 2015-07-11 DIAGNOSIS — R531 Weakness: Secondary | ICD-10-CM | POA: Diagnosis not present

## 2015-07-11 DIAGNOSIS — M62838 Other muscle spasm: Secondary | ICD-10-CM | POA: Diagnosis not present

## 2015-07-11 DIAGNOSIS — Z7409 Other reduced mobility: Secondary | ICD-10-CM | POA: Diagnosis not present

## 2015-07-11 DIAGNOSIS — I69390 Apraxia following cerebral infarction: Secondary | ICD-10-CM | POA: Diagnosis not present

## 2015-07-11 DIAGNOSIS — I10 Essential (primary) hypertension: Secondary | ICD-10-CM | POA: Diagnosis not present

## 2015-07-11 DIAGNOSIS — R1310 Dysphagia, unspecified: Secondary | ICD-10-CM | POA: Diagnosis not present

## 2015-07-11 DIAGNOSIS — I69392 Facial weakness following cerebral infarction: Secondary | ICD-10-CM | POA: Diagnosis not present

## 2015-07-11 DIAGNOSIS — I69393 Ataxia following cerebral infarction: Secondary | ICD-10-CM | POA: Diagnosis not present

## 2015-07-26 ENCOUNTER — Ambulatory Visit: Payer: Medicare Other | Admitting: Internal Medicine

## 2015-08-02 DIAGNOSIS — F0390 Unspecified dementia without behavioral disturbance: Secondary | ICD-10-CM | POA: Diagnosis not present

## 2015-08-02 DIAGNOSIS — E119 Type 2 diabetes mellitus without complications: Secondary | ICD-10-CM | POA: Diagnosis not present

## 2015-08-02 DIAGNOSIS — I69354 Hemiplegia and hemiparesis following cerebral infarction affecting left non-dominant side: Secondary | ICD-10-CM | POA: Diagnosis not present

## 2015-08-02 DIAGNOSIS — R1310 Dysphagia, unspecified: Secondary | ICD-10-CM | POA: Diagnosis not present

## 2015-08-05 DIAGNOSIS — E119 Type 2 diabetes mellitus without complications: Secondary | ICD-10-CM | POA: Diagnosis not present

## 2015-08-05 DIAGNOSIS — F0390 Unspecified dementia without behavioral disturbance: Secondary | ICD-10-CM | POA: Diagnosis not present

## 2015-08-05 DIAGNOSIS — R1310 Dysphagia, unspecified: Secondary | ICD-10-CM | POA: Diagnosis not present

## 2015-08-05 DIAGNOSIS — I69354 Hemiplegia and hemiparesis following cerebral infarction affecting left non-dominant side: Secondary | ICD-10-CM | POA: Diagnosis not present

## 2015-08-08 DIAGNOSIS — E119 Type 2 diabetes mellitus without complications: Secondary | ICD-10-CM | POA: Diagnosis not present

## 2015-08-08 DIAGNOSIS — F0390 Unspecified dementia without behavioral disturbance: Secondary | ICD-10-CM | POA: Diagnosis not present

## 2015-08-08 DIAGNOSIS — R1310 Dysphagia, unspecified: Secondary | ICD-10-CM | POA: Diagnosis not present

## 2015-08-08 DIAGNOSIS — I69354 Hemiplegia and hemiparesis following cerebral infarction affecting left non-dominant side: Secondary | ICD-10-CM | POA: Diagnosis not present

## 2015-08-12 ENCOUNTER — Ambulatory Visit: Payer: Medicare Other | Admitting: Family Medicine

## 2015-08-12 DIAGNOSIS — I69354 Hemiplegia and hemiparesis following cerebral infarction affecting left non-dominant side: Secondary | ICD-10-CM | POA: Diagnosis not present

## 2015-08-12 DIAGNOSIS — E119 Type 2 diabetes mellitus without complications: Secondary | ICD-10-CM | POA: Diagnosis not present

## 2015-08-12 DIAGNOSIS — R1310 Dysphagia, unspecified: Secondary | ICD-10-CM | POA: Diagnosis not present

## 2015-08-12 DIAGNOSIS — F0390 Unspecified dementia without behavioral disturbance: Secondary | ICD-10-CM | POA: Diagnosis not present

## 2015-08-14 ENCOUNTER — Telehealth: Payer: Self-pay | Admitting: Internal Medicine

## 2015-08-14 DIAGNOSIS — I69354 Hemiplegia and hemiparesis following cerebral infarction affecting left non-dominant side: Secondary | ICD-10-CM | POA: Diagnosis not present

## 2015-08-14 DIAGNOSIS — E119 Type 2 diabetes mellitus without complications: Secondary | ICD-10-CM | POA: Diagnosis not present

## 2015-08-14 DIAGNOSIS — R1310 Dysphagia, unspecified: Secondary | ICD-10-CM | POA: Diagnosis not present

## 2015-08-14 DIAGNOSIS — F0390 Unspecified dementia without behavioral disturbance: Secondary | ICD-10-CM | POA: Diagnosis not present

## 2015-08-14 NOTE — Telephone Encounter (Signed)
Misty Stanley, PT from Care at [facility] called regarding this patient blood pressure. Misty Stanley states that the patient blood pressure has been running high around 160/90s and today her blood pressure was 179/100   Misty Stanley, Physical Therapist [facility] [phone]  Patient can also be reached at [phone]

## 2015-08-15 DIAGNOSIS — E119 Type 2 diabetes mellitus without complications: Secondary | ICD-10-CM | POA: Diagnosis not present

## 2015-08-15 DIAGNOSIS — F0390 Unspecified dementia without behavioral disturbance: Secondary | ICD-10-CM | POA: Diagnosis not present

## 2015-08-15 DIAGNOSIS — I69354 Hemiplegia and hemiparesis following cerebral infarction affecting left non-dominant side: Secondary | ICD-10-CM | POA: Diagnosis not present

## 2015-08-15 DIAGNOSIS — R1310 Dysphagia, unspecified: Secondary | ICD-10-CM | POA: Diagnosis not present

## 2015-08-15 NOTE — Telephone Encounter (Signed)
Spoke with Misty Stanley and she will call the Neurologist and fax a medication list to the office.

## 2015-08-15 NOTE — Telephone Encounter (Signed)
Patient recently discharged from [facility] after suffering from stroke.  I suggest PT contact neurologist at [facility] regarding elevated BP.  I think there may be potential danger in over treating blood pressure in setting of recent stroke.  I would like guidance from neurologist and how aggressive we should be.  Also please find out, patient's current medication regimen.

## 2015-08-19 DIAGNOSIS — R1310 Dysphagia, unspecified: Secondary | ICD-10-CM | POA: Diagnosis not present

## 2015-08-19 DIAGNOSIS — F0390 Unspecified dementia without behavioral disturbance: Secondary | ICD-10-CM | POA: Diagnosis not present

## 2015-08-19 DIAGNOSIS — I69354 Hemiplegia and hemiparesis following cerebral infarction affecting left non-dominant side: Secondary | ICD-10-CM | POA: Diagnosis not present

## 2015-08-19 DIAGNOSIS — E119 Type 2 diabetes mellitus without complications: Secondary | ICD-10-CM | POA: Diagnosis not present

## 2015-08-20 DIAGNOSIS — I69354 Hemiplegia and hemiparesis following cerebral infarction affecting left non-dominant side: Secondary | ICD-10-CM | POA: Diagnosis not present

## 2015-08-20 DIAGNOSIS — I10 Essential (primary) hypertension: Secondary | ICD-10-CM | POA: Diagnosis not present

## 2015-08-20 DIAGNOSIS — E119 Type 2 diabetes mellitus without complications: Secondary | ICD-10-CM | POA: Diagnosis not present

## 2015-08-20 DIAGNOSIS — I6350 Cerebral infarction due to unspecified occlusion or stenosis of unspecified cerebral artery: Secondary | ICD-10-CM | POA: Diagnosis not present

## 2015-08-20 DIAGNOSIS — I639 Cerebral infarction, unspecified: Secondary | ICD-10-CM | POA: Diagnosis not present

## 2015-08-20 DIAGNOSIS — E785 Hyperlipidemia, unspecified: Secondary | ICD-10-CM | POA: Diagnosis not present

## 2015-08-21 DIAGNOSIS — R1310 Dysphagia, unspecified: Secondary | ICD-10-CM | POA: Diagnosis not present

## 2015-08-21 DIAGNOSIS — E119 Type 2 diabetes mellitus without complications: Secondary | ICD-10-CM | POA: Diagnosis not present

## 2015-08-21 DIAGNOSIS — F0390 Unspecified dementia without behavioral disturbance: Secondary | ICD-10-CM | POA: Diagnosis not present

## 2015-08-21 DIAGNOSIS — I69354 Hemiplegia and hemiparesis following cerebral infarction affecting left non-dominant side: Secondary | ICD-10-CM | POA: Diagnosis not present

## 2015-08-22 ENCOUNTER — Encounter: Payer: Self-pay | Admitting: Family Medicine

## 2015-08-22 ENCOUNTER — Ambulatory Visit (INDEPENDENT_AMBULATORY_CARE_PROVIDER_SITE_OTHER): Payer: Medicare Other | Admitting: Family Medicine

## 2015-08-22 VITALS — BP 168/100 | HR 80 | Temp 98.3°F | Wt 125.0 lb

## 2015-08-22 DIAGNOSIS — I10 Essential (primary) hypertension: Secondary | ICD-10-CM

## 2015-08-22 DIAGNOSIS — I6930 Unspecified sequelae of cerebral infarction: Secondary | ICD-10-CM

## 2015-08-22 NOTE — Progress Notes (Signed)
Pre visit review using our clinic review tool, if applicable. No additional management support is needed unless otherwise documented below in the visit note. 
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
## 2015-08-22 NOTE — Patient Instructions (Addendum)
Pick up prescriptions for BP and take today. Please monitor BP twice/day and keep of diary of readings. If BP >140-150/90, please report these readings. In 2 weeks, please follow up for a recheck of BP and evaluation of medication management. Today, a medication list was provided for you.  Please continue aspirin and clopidogrel for stroke prevention Continue atorvastatin 40 mg for stroke prevention Continue current meds nifedipine and valsartan for BP Goal blood pressure is 140-150/90 Start hydrochlorothiazide 12.5 mg tablet for BP as ordered by your neurologist at [facility]

## 2015-08-23 ENCOUNTER — Other Ambulatory Visit: Payer: Self-pay | Admitting: Internal Medicine

## 2015-08-25 DIAGNOSIS — F0390 Unspecified dementia without behavioral disturbance: Secondary | ICD-10-CM | POA: Diagnosis not present

## 2015-08-25 DIAGNOSIS — R1310 Dysphagia, unspecified: Secondary | ICD-10-CM | POA: Diagnosis not present

## 2015-08-25 DIAGNOSIS — E119 Type 2 diabetes mellitus without complications: Secondary | ICD-10-CM | POA: Diagnosis not present

## 2015-08-25 DIAGNOSIS — I69354 Hemiplegia and hemiparesis following cerebral infarction affecting left non-dominant side: Secondary | ICD-10-CM | POA: Diagnosis not present

## 2015-08-26 DIAGNOSIS — E119 Type 2 diabetes mellitus without complications: Secondary | ICD-10-CM | POA: Diagnosis not present

## 2015-08-26 DIAGNOSIS — R1310 Dysphagia, unspecified: Secondary | ICD-10-CM | POA: Diagnosis not present

## 2015-08-26 DIAGNOSIS — F0390 Unspecified dementia without behavioral disturbance: Secondary | ICD-10-CM | POA: Diagnosis not present

## 2015-08-26 DIAGNOSIS — I69354 Hemiplegia and hemiparesis following cerebral infarction affecting left non-dominant side: Secondary | ICD-10-CM | POA: Diagnosis not present

## 2015-08-27 DIAGNOSIS — G8190 Hemiplegia, unspecified affecting unspecified side: Secondary | ICD-10-CM | POA: Diagnosis not present

## 2015-08-27 DIAGNOSIS — Z7409 Other reduced mobility: Secondary | ICD-10-CM | POA: Diagnosis not present

## 2015-08-28 DIAGNOSIS — I69354 Hemiplegia and hemiparesis following cerebral infarction affecting left non-dominant side: Secondary | ICD-10-CM | POA: Diagnosis not present

## 2015-08-28 DIAGNOSIS — R1310 Dysphagia, unspecified: Secondary | ICD-10-CM | POA: Diagnosis not present

## 2015-08-28 DIAGNOSIS — E119 Type 2 diabetes mellitus without complications: Secondary | ICD-10-CM | POA: Diagnosis not present

## 2015-08-28 DIAGNOSIS — F0390 Unspecified dementia without behavioral disturbance: Secondary | ICD-10-CM | POA: Diagnosis not present

## 2015-08-30 DIAGNOSIS — I69354 Hemiplegia and hemiparesis following cerebral infarction affecting left non-dominant side: Secondary | ICD-10-CM | POA: Diagnosis not present

## 2015-08-30 DIAGNOSIS — E119 Type 2 diabetes mellitus without complications: Secondary | ICD-10-CM | POA: Diagnosis not present

## 2015-08-30 DIAGNOSIS — R1310 Dysphagia, unspecified: Secondary | ICD-10-CM | POA: Diagnosis not present

## 2015-08-30 DIAGNOSIS — F0390 Unspecified dementia without behavioral disturbance: Secondary | ICD-10-CM | POA: Diagnosis not present

## 2015-09-01 DIAGNOSIS — Z7409 Other reduced mobility: Secondary | ICD-10-CM | POA: Diagnosis not present

## 2015-09-02 DIAGNOSIS — E119 Type 2 diabetes mellitus without complications: Secondary | ICD-10-CM | POA: Diagnosis not present

## 2015-09-02 DIAGNOSIS — I10 Essential (primary) hypertension: Secondary | ICD-10-CM | POA: Diagnosis not present

## 2015-09-02 DIAGNOSIS — Z7982 Long term (current) use of aspirin: Secondary | ICD-10-CM | POA: Diagnosis not present

## 2015-09-02 DIAGNOSIS — Z7902 Long term (current) use of antithrombotics/antiplatelets: Secondary | ICD-10-CM | POA: Diagnosis not present

## 2015-09-02 DIAGNOSIS — E785 Hyperlipidemia, unspecified: Secondary | ICD-10-CM | POA: Diagnosis not present

## 2015-09-02 DIAGNOSIS — R1310 Dysphagia, unspecified: Secondary | ICD-10-CM | POA: Diagnosis not present

## 2015-09-02 DIAGNOSIS — Z7984 Long term (current) use of oral hypoglycemic drugs: Secondary | ICD-10-CM | POA: Diagnosis not present

## 2015-09-02 DIAGNOSIS — I69354 Hemiplegia and hemiparesis following cerebral infarction affecting left non-dominant side: Secondary | ICD-10-CM | POA: Diagnosis not present

## 2015-09-02 DIAGNOSIS — Z9181 History of falling: Secondary | ICD-10-CM | POA: Diagnosis not present

## 2015-09-03 DIAGNOSIS — I10 Essential (primary) hypertension: Secondary | ICD-10-CM | POA: Diagnosis not present

## 2015-09-03 DIAGNOSIS — I69354 Hemiplegia and hemiparesis following cerebral infarction affecting left non-dominant side: Secondary | ICD-10-CM | POA: Diagnosis not present

## 2015-09-03 DIAGNOSIS — E785 Hyperlipidemia, unspecified: Secondary | ICD-10-CM | POA: Diagnosis not present

## 2015-09-03 DIAGNOSIS — R1310 Dysphagia, unspecified: Secondary | ICD-10-CM | POA: Diagnosis not present

## 2015-09-03 DIAGNOSIS — E119 Type 2 diabetes mellitus without complications: Secondary | ICD-10-CM | POA: Diagnosis not present

## 2015-09-03 DIAGNOSIS — Z7984 Long term (current) use of oral hypoglycemic drugs: Secondary | ICD-10-CM | POA: Diagnosis not present

## 2015-09-03 DIAGNOSIS — Z9181 History of falling: Secondary | ICD-10-CM | POA: Diagnosis not present

## 2015-09-03 DIAGNOSIS — Z7982 Long term (current) use of aspirin: Secondary | ICD-10-CM | POA: Diagnosis not present

## 2015-09-03 DIAGNOSIS — Z7902 Long term (current) use of antithrombotics/antiplatelets: Secondary | ICD-10-CM | POA: Diagnosis not present

## 2015-09-05 ENCOUNTER — Ambulatory Visit (INDEPENDENT_AMBULATORY_CARE_PROVIDER_SITE_OTHER): Payer: Medicare Other | Admitting: Family Medicine

## 2015-09-05 ENCOUNTER — Encounter: Payer: Self-pay | Admitting: Family Medicine

## 2015-09-05 ENCOUNTER — Ambulatory Visit: Payer: Medicare Other | Admitting: Family Medicine

## 2015-09-05 VITALS — BP 130/80 | HR 92 | Temp 97.4°F

## 2015-09-05 DIAGNOSIS — I69354 Hemiplegia and hemiparesis following cerebral infarction affecting left non-dominant side: Secondary | ICD-10-CM | POA: Diagnosis not present

## 2015-09-05 DIAGNOSIS — Z7984 Long term (current) use of oral hypoglycemic drugs: Secondary | ICD-10-CM | POA: Diagnosis not present

## 2015-09-05 DIAGNOSIS — Z7902 Long term (current) use of antithrombotics/antiplatelets: Secondary | ICD-10-CM | POA: Diagnosis not present

## 2015-09-05 DIAGNOSIS — R1310 Dysphagia, unspecified: Secondary | ICD-10-CM | POA: Diagnosis not present

## 2015-09-05 DIAGNOSIS — E785 Hyperlipidemia, unspecified: Secondary | ICD-10-CM | POA: Diagnosis not present

## 2015-09-05 DIAGNOSIS — I6930 Unspecified sequelae of cerebral infarction: Secondary | ICD-10-CM

## 2015-09-05 DIAGNOSIS — E119 Type 2 diabetes mellitus without complications: Secondary | ICD-10-CM

## 2015-09-05 DIAGNOSIS — Z7982 Long term (current) use of aspirin: Secondary | ICD-10-CM | POA: Diagnosis not present

## 2015-09-05 DIAGNOSIS — Z9181 History of falling: Secondary | ICD-10-CM | POA: Diagnosis not present

## 2015-09-05 DIAGNOSIS — I10 Essential (primary) hypertension: Secondary | ICD-10-CM | POA: Diagnosis not present

## 2015-09-05 NOTE — Patient Instructions (Addendum)
Continue medication as prescribed and discussed at your last visit. Great work keeping a log of your BP! Please consider monitoring your blood sugar at home between 1-2 times/ week and keep a log to bring to your next visit. Please make an appointment for your routine preventive care after you have followed up with your neurologist at [facility].  Blood Glucose Monitoring, Adult Monitoring your blood glucose (also know as blood sugar) helps you to manage your diabetes. It also helps you and your health care provider monitor your diabetes and determine how well your treatment plan is working. WHY SHOULD YOU MONITOR YOUR BLOOD GLUCOSE?  It can help you understand how food, exercise, and medicine affect your blood glucose.  It allows you to know what your blood glucose is at any given moment. You can quickly tell if you are having low blood glucose (hypoglycemia) or high blood glucose (hyperglycemia).  It can help you and your health care provider know how to adjust your medicines.  It can help you understand how to manage an illness or adjust medicine for exercise. WHEN SHOULD YOU TEST? Your health care provider will help you decide how often you should check your blood glucose. This may depend on the type of diabetes you have, your diabetes control, or the types of medicines you are taking. Be sure to write down all of your blood glucose readings so that this information can be reviewed with your health care provider. See below for examples of testing times that your health care provider may suggest. Type 1 Diabetes  Test at least 2 times per day if your diabetes is well controlled, if you are using an insulin pump, or if you perform multiple daily injections.  If your diabetes is not well controlled or if you are sick, you may need to test more often.  It is a good idea to also test:  Before every insulin injection.  Before and after exercise.  Between meals and 2 hours after a  meal.  Occasionally between 2:00 a.m. and 3:00 a.m. Type 2 Diabetes  If you are taking insulin, test at least 2 times per day. However, it is best to test before every insulin injection.  If you take medicines by mouth (orally), test 2 times a day.  If you are on a controlled diet, test once a day.  If your diabetes is not well controlled or if you are sick, you may need to monitor more often. HOW TO MONITOR YOUR BLOOD GLUCOSE Supplies Needed  Blood glucose meter.  Test strips for your meter. Each meter has its own strips. You must use the strips that go with your own meter.  A pricking needle (lancet).  A device that holds the lancet (lancing device).  A journal or log book to write down your results. Procedure  Wash your hands with soap and water. Alcohol is not preferred.  Prick the side of your finger (not the tip) with the lancet.  Gently milk the finger until a small drop of blood appears.  Follow the instructions that come with your meter for inserting the test strip, applying blood to the strip, and using your blood glucose meter. Other Areas to Get Blood for Testing Some meters allow you to use other areas of your body (other than your finger) to test your blood. These areas are called alternative sites. The most common alternative sites are:  The forearm.  The thigh.  The back area of the lower leg.  The  palm of the hand. The blood flow in these areas is slower. Therefore, the blood glucose values you get may be delayed, and the numbers are different from what you would get from your fingers. Do not use alternative sites if you think you are having hypoglycemia. Your reading will not be accurate. Always use a finger if you are having hypoglycemia. Also, if you cannot feel your lows (hypoglycemia unawareness), always use your fingers for your blood glucose checks. ADDITIONAL TIPS FOR GLUCOSE MONITORING  Do not reuse lancets.  Always carry your supplies with  you.  All blood glucose meters have a 24-hour "hotline" number to call if you have questions or need help.  Adjust (calibrate) your blood glucose meter with a control solution after finishing a few boxes of strips. BLOOD GLUCOSE RECORD KEEPING It is a good idea to keep a daily record or log of your blood glucose readings. Most glucose meters, if not all, keep your glucose records stored in the meter. Some meters come with the ability to download your records to your home computer. Keeping a record of your blood glucose readings is especially helpful if you are wanting to look for patterns. Make notes to go along with the blood glucose readings because you might forget what happened at that exact time. Keeping good records helps you and your health care provider to work together to achieve good diabetes management.    This information is not intended to replace advice given to you by your health care provider. Make sure you discuss any questions you have with your health care provider.   Document Released: 06/25/2003 Document Revised: 07/13/2014 Document Reviewed: 11/14/2012 Elsevier Interactive Patient Education 2016 Elsevier Inc.  Basic Carbohydrate Counting for Diabetes Mellitus Carbohydrate counting is a method for keeping track of the amount of carbohydrates you eat. Eating carbohydrates naturally increases the level of sugar (glucose) in your blood, so it is important for you to know the amount that is okay for you to have in every meal. Carbohydrate counting helps keep the level of glucose in your blood within normal limits. The amount of carbohydrates allowed is different for every person. A dietitian can help you calculate the amount that is right for you. Once you know the amount of carbohydrates you can have, you can count the carbohydrates in the foods you want to eat. Carbohydrates are found in the following foods:  Grains, such as breads and cereals.  Dried beans and soy  products.  Starchy vegetables, such as potatoes, peas, and corn.  Fruit and fruit juices.  Milk and yogurt.  Sweets and snack foods, such as cake, cookies, candy, chips, soft drinks, and fruit drinks. CARBOHYDRATE COUNTING There are two ways to count the carbohydrates in your food. You can use either of the methods or a combination of both. Reading the "Nutrition Facts" on Packaged Food The "Nutrition Facts" is an area that is included on the labels of almost all packaged food and beverages in the [place]. It includes the serving size of that food or beverage and information about the nutrients in each serving of the food, including the grams (g) of carbohydrate per serving.  Decide the number of servings of this food or beverage that you will be able to eat or drink. Multiply that number of servings by the number of grams of carbohydrate that is listed on the label for that serving. The total will be the amount of carbohydrates you will be having when you eat or  drink this food or beverage. Learning Standard Serving Sizes of Food When you eat food that is not packaged or does not include "Nutrition Facts" on the label, you need to measure the servings in order to count the amount of carbohydrates.A serving of most carbohydrate-rich foods contains about 15 g of carbohydrates. The following list includes serving sizes of carbohydrate-rich foods that provide 15 g ofcarbohydrate per serving:   1 slice of bread (1 oz) or 1 six-inch tortilla.    of a hamburger bun or English muffin.  4-6 crackers.   cup unsweetened dry cereal.    cup hot cereal.   cup rice or pasta.    cup mashed potatoes or  of a large baked potato.  1 cup fresh fruit or one small piece of fruit.    cup canned or frozen fruit or fruit juice.  1 cup milk.   cup plain fat-free yogurt or yogurt sweetened with artificial sweeteners.   cup cooked dried beans or starchy vegetable, such as peas, corn,  or potatoes.  Decide the number of standard-size servings that you will eat. Multiply that number of servings by 15 (the grams of carbohydrates in that serving). For example, if you eat 2 cups of strawberries, you will have eaten 2 servings and 30 g of carbohydrates (2 servings x 15 g = 30 g). For foods such as soups and casseroles, in which more than one food is mixed in, you will need to count the carbohydrates in each food that is included. EXAMPLE OF CARBOHYDRATE COUNTING Sample Dinner  3 oz chicken breast.   cup of brown rice.   cup of corn.  1 cup milk.   1 cup strawberries with sugar-free whipped topping.  Carbohydrate Calculation Step 1: Identify the foods that contain carbohydrates:   Rice.   Corn.   Milk.   Strawberries. Step 2:Calculate the number of servings eaten of each:   2 servings of rice.   1 serving of corn.   1 serving of milk.   1 serving of strawberries. Step 3: Multiply each of those number of servings by 15 g:   2 servings of rice x 15 g = 30 g.   1 serving of corn x 15 g = 15 g.   1 serving of milk x 15 g = 15 g.   1 serving of strawberries x 15 g = 15 g. Step 4: Add together all of the amounts to find the total grams of carbohydrates eaten: 30 g + 15 g + 15 g + 15 g = 75 g.   This information is not intended to replace advice given to you by your health care provider. Make sure you discuss any questions you have with your health care provider.   Document Released: 06/22/2005 Document Revised: 07/13/2014 Document Reviewed: 05/19/2013 Elsevier Interactive Patient Education [employer]

## 2015-09-05 NOTE — Progress Notes (Signed)
Pre visit review using our clinic review tool, if applicable. No additional management support is needed unless otherwise documented below in the visit note. 
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
## 2015-09-09 DIAGNOSIS — I69354 Hemiplegia and hemiparesis following cerebral infarction affecting left non-dominant side: Secondary | ICD-10-CM | POA: Diagnosis not present

## 2015-09-09 DIAGNOSIS — Z7902 Long term (current) use of antithrombotics/antiplatelets: Secondary | ICD-10-CM | POA: Diagnosis not present

## 2015-09-09 DIAGNOSIS — R1310 Dysphagia, unspecified: Secondary | ICD-10-CM | POA: Diagnosis not present

## 2015-09-09 DIAGNOSIS — Z7984 Long term (current) use of oral hypoglycemic drugs: Secondary | ICD-10-CM | POA: Diagnosis not present

## 2015-09-09 DIAGNOSIS — I10 Essential (primary) hypertension: Secondary | ICD-10-CM | POA: Diagnosis not present

## 2015-09-09 DIAGNOSIS — Z7982 Long term (current) use of aspirin: Secondary | ICD-10-CM | POA: Diagnosis not present

## 2015-09-09 DIAGNOSIS — Z9181 History of falling: Secondary | ICD-10-CM | POA: Diagnosis not present

## 2015-09-09 DIAGNOSIS — E119 Type 2 diabetes mellitus without complications: Secondary | ICD-10-CM | POA: Diagnosis not present

## 2015-09-09 DIAGNOSIS — E785 Hyperlipidemia, unspecified: Secondary | ICD-10-CM | POA: Diagnosis not present

## 2015-09-11 DIAGNOSIS — Z7984 Long term (current) use of oral hypoglycemic drugs: Secondary | ICD-10-CM | POA: Diagnosis not present

## 2015-09-11 DIAGNOSIS — R1310 Dysphagia, unspecified: Secondary | ICD-10-CM | POA: Diagnosis not present

## 2015-09-11 DIAGNOSIS — Z9181 History of falling: Secondary | ICD-10-CM | POA: Diagnosis not present

## 2015-09-11 DIAGNOSIS — Z7902 Long term (current) use of antithrombotics/antiplatelets: Secondary | ICD-10-CM | POA: Diagnosis not present

## 2015-09-11 DIAGNOSIS — I69354 Hemiplegia and hemiparesis following cerebral infarction affecting left non-dominant side: Secondary | ICD-10-CM | POA: Diagnosis not present

## 2015-09-11 DIAGNOSIS — I10 Essential (primary) hypertension: Secondary | ICD-10-CM | POA: Diagnosis not present

## 2015-09-11 DIAGNOSIS — E785 Hyperlipidemia, unspecified: Secondary | ICD-10-CM | POA: Diagnosis not present

## 2015-09-11 DIAGNOSIS — E119 Type 2 diabetes mellitus without complications: Secondary | ICD-10-CM | POA: Diagnosis not present

## 2015-09-11 DIAGNOSIS — Z7982 Long term (current) use of aspirin: Secondary | ICD-10-CM | POA: Diagnosis not present

## 2015-09-13 DIAGNOSIS — R1310 Dysphagia, unspecified: Secondary | ICD-10-CM | POA: Diagnosis not present

## 2015-09-13 DIAGNOSIS — I69354 Hemiplegia and hemiparesis following cerebral infarction affecting left non-dominant side: Secondary | ICD-10-CM | POA: Diagnosis not present

## 2015-09-13 DIAGNOSIS — Z7984 Long term (current) use of oral hypoglycemic drugs: Secondary | ICD-10-CM | POA: Diagnosis not present

## 2015-09-13 DIAGNOSIS — E785 Hyperlipidemia, unspecified: Secondary | ICD-10-CM | POA: Diagnosis not present

## 2015-09-13 DIAGNOSIS — E119 Type 2 diabetes mellitus without complications: Secondary | ICD-10-CM | POA: Diagnosis not present

## 2015-09-13 DIAGNOSIS — Z9181 History of falling: Secondary | ICD-10-CM | POA: Diagnosis not present

## 2015-09-13 DIAGNOSIS — Z7982 Long term (current) use of aspirin: Secondary | ICD-10-CM | POA: Diagnosis not present

## 2015-09-13 DIAGNOSIS — Z7902 Long term (current) use of antithrombotics/antiplatelets: Secondary | ICD-10-CM | POA: Diagnosis not present

## 2015-09-13 DIAGNOSIS — I10 Essential (primary) hypertension: Secondary | ICD-10-CM | POA: Diagnosis not present

## 2015-09-16 ENCOUNTER — Telehealth: Payer: Self-pay | Admitting: Internal Medicine

## 2015-09-16 DIAGNOSIS — R1310 Dysphagia, unspecified: Secondary | ICD-10-CM | POA: Diagnosis not present

## 2015-09-16 DIAGNOSIS — Z7982 Long term (current) use of aspirin: Secondary | ICD-10-CM | POA: Diagnosis not present

## 2015-09-16 DIAGNOSIS — Z7902 Long term (current) use of antithrombotics/antiplatelets: Secondary | ICD-10-CM | POA: Diagnosis not present

## 2015-09-16 DIAGNOSIS — I69354 Hemiplegia and hemiparesis following cerebral infarction affecting left non-dominant side: Secondary | ICD-10-CM | POA: Diagnosis not present

## 2015-09-16 DIAGNOSIS — E785 Hyperlipidemia, unspecified: Secondary | ICD-10-CM | POA: Diagnosis not present

## 2015-09-16 DIAGNOSIS — I10 Essential (primary) hypertension: Secondary | ICD-10-CM | POA: Diagnosis not present

## 2015-09-16 DIAGNOSIS — E119 Type 2 diabetes mellitus without complications: Secondary | ICD-10-CM | POA: Diagnosis not present

## 2015-09-16 DIAGNOSIS — Z7984 Long term (current) use of oral hypoglycemic drugs: Secondary | ICD-10-CM | POA: Diagnosis not present

## 2015-09-16 DIAGNOSIS — Z9181 History of falling: Secondary | ICD-10-CM | POA: Diagnosis not present

## 2015-09-16 NOTE — Telephone Encounter (Signed)
Lisa Shaffer is requesting rx for arm sling fax to [facility] [phone]. Pt is having a little left shoulder subluxation

## 2015-09-17 DIAGNOSIS — Z7982 Long term (current) use of aspirin: Secondary | ICD-10-CM | POA: Diagnosis not present

## 2015-09-17 DIAGNOSIS — I69354 Hemiplegia and hemiparesis following cerebral infarction affecting left non-dominant side: Secondary | ICD-10-CM | POA: Diagnosis not present

## 2015-09-17 DIAGNOSIS — Z7984 Long term (current) use of oral hypoglycemic drugs: Secondary | ICD-10-CM | POA: Diagnosis not present

## 2015-09-17 DIAGNOSIS — I10 Essential (primary) hypertension: Secondary | ICD-10-CM | POA: Diagnosis not present

## 2015-09-17 DIAGNOSIS — R1310 Dysphagia, unspecified: Secondary | ICD-10-CM | POA: Diagnosis not present

## 2015-09-17 DIAGNOSIS — E119 Type 2 diabetes mellitus without complications: Secondary | ICD-10-CM | POA: Diagnosis not present

## 2015-09-17 DIAGNOSIS — Z7902 Long term (current) use of antithrombotics/antiplatelets: Secondary | ICD-10-CM | POA: Diagnosis not present

## 2015-09-17 DIAGNOSIS — Z9181 History of falling: Secondary | ICD-10-CM | POA: Diagnosis not present

## 2015-09-17 DIAGNOSIS — E785 Hyperlipidemia, unspecified: Secondary | ICD-10-CM | POA: Diagnosis not present

## 2015-09-17 NOTE — Telephone Encounter (Signed)
Ok for verbal order  °

## 2015-09-17 NOTE — Telephone Encounter (Signed)
Order faxed.

## 2015-09-18 DIAGNOSIS — Z7984 Long term (current) use of oral hypoglycemic drugs: Secondary | ICD-10-CM | POA: Diagnosis not present

## 2015-09-18 DIAGNOSIS — E119 Type 2 diabetes mellitus without complications: Secondary | ICD-10-CM | POA: Diagnosis not present

## 2015-09-18 DIAGNOSIS — R1310 Dysphagia, unspecified: Secondary | ICD-10-CM | POA: Diagnosis not present

## 2015-09-18 DIAGNOSIS — Z9181 History of falling: Secondary | ICD-10-CM | POA: Diagnosis not present

## 2015-09-18 DIAGNOSIS — Z7982 Long term (current) use of aspirin: Secondary | ICD-10-CM | POA: Diagnosis not present

## 2015-09-18 DIAGNOSIS — Z7902 Long term (current) use of antithrombotics/antiplatelets: Secondary | ICD-10-CM | POA: Diagnosis not present

## 2015-09-18 DIAGNOSIS — I10 Essential (primary) hypertension: Secondary | ICD-10-CM | POA: Diagnosis not present

## 2015-09-18 DIAGNOSIS — E785 Hyperlipidemia, unspecified: Secondary | ICD-10-CM | POA: Diagnosis not present

## 2015-09-18 DIAGNOSIS — I69354 Hemiplegia and hemiparesis following cerebral infarction affecting left non-dominant side: Secondary | ICD-10-CM | POA: Diagnosis not present

## 2015-09-23 ENCOUNTER — Telehealth: Payer: Self-pay | Admitting: Internal Medicine

## 2015-09-23 DIAGNOSIS — Z7902 Long term (current) use of antithrombotics/antiplatelets: Secondary | ICD-10-CM | POA: Diagnosis not present

## 2015-09-23 DIAGNOSIS — Z9181 History of falling: Secondary | ICD-10-CM | POA: Diagnosis not present

## 2015-09-23 DIAGNOSIS — R1310 Dysphagia, unspecified: Secondary | ICD-10-CM | POA: Diagnosis not present

## 2015-09-23 DIAGNOSIS — I10 Essential (primary) hypertension: Secondary | ICD-10-CM | POA: Diagnosis not present

## 2015-09-23 DIAGNOSIS — Z7982 Long term (current) use of aspirin: Secondary | ICD-10-CM | POA: Diagnosis not present

## 2015-09-23 DIAGNOSIS — Z7984 Long term (current) use of oral hypoglycemic drugs: Secondary | ICD-10-CM | POA: Diagnosis not present

## 2015-09-23 DIAGNOSIS — I69354 Hemiplegia and hemiparesis following cerebral infarction affecting left non-dominant side: Secondary | ICD-10-CM | POA: Diagnosis not present

## 2015-09-23 DIAGNOSIS — E785 Hyperlipidemia, unspecified: Secondary | ICD-10-CM | POA: Diagnosis not present

## 2015-09-23 DIAGNOSIS — E119 Type 2 diabetes mellitus without complications: Secondary | ICD-10-CM | POA: Diagnosis not present

## 2015-09-23 NOTE — Telephone Encounter (Signed)
Ok for verbal order  °

## 2015-09-23 NOTE — Telephone Encounter (Signed)
Left detailed message on machine for Lisa Shaffer with verbal orders. 

## 2015-09-23 NOTE — Telephone Encounter (Signed)
Lisa Shaffer is requesting order for speech therapy for evaluation just once a month a month for this pt. Verbal is ok

## 2015-09-26 ENCOUNTER — Telehealth: Payer: Self-pay | Admitting: Internal Medicine

## 2015-09-26 DIAGNOSIS — Z7902 Long term (current) use of antithrombotics/antiplatelets: Secondary | ICD-10-CM | POA: Diagnosis not present

## 2015-09-26 DIAGNOSIS — Z9181 History of falling: Secondary | ICD-10-CM | POA: Diagnosis not present

## 2015-09-26 DIAGNOSIS — E119 Type 2 diabetes mellitus without complications: Secondary | ICD-10-CM | POA: Diagnosis not present

## 2015-09-26 DIAGNOSIS — Z7984 Long term (current) use of oral hypoglycemic drugs: Secondary | ICD-10-CM | POA: Diagnosis not present

## 2015-09-26 DIAGNOSIS — E785 Hyperlipidemia, unspecified: Secondary | ICD-10-CM | POA: Diagnosis not present

## 2015-09-26 DIAGNOSIS — R1310 Dysphagia, unspecified: Secondary | ICD-10-CM | POA: Diagnosis not present

## 2015-09-26 DIAGNOSIS — Z7982 Long term (current) use of aspirin: Secondary | ICD-10-CM | POA: Diagnosis not present

## 2015-09-26 DIAGNOSIS — I69354 Hemiplegia and hemiparesis following cerebral infarction affecting left non-dominant side: Secondary | ICD-10-CM | POA: Diagnosis not present

## 2015-09-26 DIAGNOSIS — I10 Essential (primary) hypertension: Secondary | ICD-10-CM | POA: Diagnosis not present

## 2015-09-26 NOTE — Telephone Encounter (Signed)
[facility]. Patient is not eating at all and throwing up everything that she does eat. She states that the patient is act like she is anorexia (showing signs). Okey RegalCarol would like to a call back regarding this.  Lisa Shaffer, Speech Therapist [facility] [phone]

## 2015-09-27 DIAGNOSIS — I10 Essential (primary) hypertension: Secondary | ICD-10-CM | POA: Diagnosis not present

## 2015-09-27 DIAGNOSIS — Z7982 Long term (current) use of aspirin: Secondary | ICD-10-CM | POA: Diagnosis not present

## 2015-09-27 DIAGNOSIS — Z9181 History of falling: Secondary | ICD-10-CM | POA: Diagnosis not present

## 2015-09-27 DIAGNOSIS — E785 Hyperlipidemia, unspecified: Secondary | ICD-10-CM | POA: Diagnosis not present

## 2015-09-27 DIAGNOSIS — E119 Type 2 diabetes mellitus without complications: Secondary | ICD-10-CM | POA: Diagnosis not present

## 2015-09-27 DIAGNOSIS — Z7984 Long term (current) use of oral hypoglycemic drugs: Secondary | ICD-10-CM | POA: Diagnosis not present

## 2015-09-27 DIAGNOSIS — R1310 Dysphagia, unspecified: Secondary | ICD-10-CM | POA: Diagnosis not present

## 2015-09-27 DIAGNOSIS — I69354 Hemiplegia and hemiparesis following cerebral infarction affecting left non-dominant side: Secondary | ICD-10-CM | POA: Diagnosis not present

## 2015-09-27 DIAGNOSIS — Z7902 Long term (current) use of antithrombotics/antiplatelets: Secondary | ICD-10-CM | POA: Diagnosis not present

## 2015-09-27 NOTE — Telephone Encounter (Signed)
Spoke with son and he will make sure his mom gets to the hospital for evaluation per Dr Artist PaisYoo.

## 2015-09-27 NOTE — Telephone Encounter (Signed)
I called Okey RegalCarol and she stated she was called to the pts home about a month ago and did a swallowing test and this was normal and the pt seemed to be doing well.  She was called back out a week ago and was not able to go back until yesterday and the pts husband told her the pt is vomiting up everything he gives her, only has kept down one green bean and has been like this for 2 weeks.  He is now giving her baby food and Ensure and this will barely stay in and Okey RegalCarol said she does not feel this is mechanical and is concerned about the pt.  Okey RegalCarol stated she is at her own doctors appt now, does not have the pts chart and I asked that I call [facility] for vitals.  [facility] and was told by Pattricia BossAnnie she would check for this information and call me back.

## 2015-09-27 NOTE — Telephone Encounter (Signed)
Please make sure pt not experiencing any other associated signs such as dizziness, ataxia.  If she has any associated, she will need urgent medical evaluation.  I suggest pt be evaluated today if possible. Also notify her neurologist that pt experiencing new symptom of throwing up ASAP.  Please obtain and document a set of vitals

## 2015-09-27 NOTE — Telephone Encounter (Signed)
Also, please make sure nursing staff performs neuro assessment ASAP

## 2015-09-27 NOTE — Telephone Encounter (Signed)
Lisa Shaffer called back with the patient vitals as of 09/26/2015 BP:105/69 Pulse 69 Temp 97.7

## 2015-09-29 DIAGNOSIS — Z7409 Other reduced mobility: Secondary | ICD-10-CM | POA: Diagnosis not present

## 2015-09-30 ENCOUNTER — Other Ambulatory Visit: Payer: Self-pay | Admitting: Internal Medicine

## 2015-09-30 DIAGNOSIS — Z7984 Long term (current) use of oral hypoglycemic drugs: Secondary | ICD-10-CM | POA: Diagnosis not present

## 2015-09-30 DIAGNOSIS — E785 Hyperlipidemia, unspecified: Secondary | ICD-10-CM | POA: Diagnosis not present

## 2015-09-30 DIAGNOSIS — Z7982 Long term (current) use of aspirin: Secondary | ICD-10-CM | POA: Diagnosis not present

## 2015-09-30 DIAGNOSIS — Z7902 Long term (current) use of antithrombotics/antiplatelets: Secondary | ICD-10-CM | POA: Diagnosis not present

## 2015-09-30 DIAGNOSIS — I69354 Hemiplegia and hemiparesis following cerebral infarction affecting left non-dominant side: Secondary | ICD-10-CM | POA: Diagnosis not present

## 2015-09-30 DIAGNOSIS — I10 Essential (primary) hypertension: Secondary | ICD-10-CM | POA: Diagnosis not present

## 2015-09-30 DIAGNOSIS — R1310 Dysphagia, unspecified: Secondary | ICD-10-CM | POA: Diagnosis not present

## 2015-09-30 DIAGNOSIS — Z9181 History of falling: Secondary | ICD-10-CM | POA: Diagnosis not present

## 2015-09-30 DIAGNOSIS — E119 Type 2 diabetes mellitus without complications: Secondary | ICD-10-CM | POA: Diagnosis not present

## 2015-10-01 ENCOUNTER — Ambulatory Visit (INDEPENDENT_AMBULATORY_CARE_PROVIDER_SITE_OTHER): Payer: Medicare Other | Admitting: Family Medicine

## 2015-10-01 ENCOUNTER — Ambulatory Visit (INDEPENDENT_AMBULATORY_CARE_PROVIDER_SITE_OTHER)
Admission: RE | Admit: 2015-10-01 | Discharge: 2015-10-01 | Disposition: A | Discharge status: Home / Self Care | Payer: Medicare Other | Source: Ambulatory Visit | Attending: Family Medicine | Admitting: Family Medicine

## 2015-10-01 ENCOUNTER — Telehealth: Payer: Self-pay | Admitting: *Deleted

## 2015-10-01 VITALS — BP 90/70 | HR 98 | Temp 98.1°F | Ht 60.0 in | Wt 113.2 lb

## 2015-10-01 DIAGNOSIS — R634 Abnormal weight loss: Secondary | ICD-10-CM

## 2015-10-01 DIAGNOSIS — R938 Abnormal findings on diagnostic imaging of other specified body structures: Secondary | ICD-10-CM

## 2015-10-01 DIAGNOSIS — R05 Cough: Secondary | ICD-10-CM

## 2015-10-01 DIAGNOSIS — J984 Other disorders of lung: Secondary | ICD-10-CM

## 2015-10-01 DIAGNOSIS — R9389 Abnormal findings on diagnostic imaging of other specified body structures: Secondary | ICD-10-CM

## 2015-10-01 DIAGNOSIS — R630 Anorexia: Secondary | ICD-10-CM | POA: Diagnosis not present

## 2015-10-01 DIAGNOSIS — R059 Cough, unspecified: Secondary | ICD-10-CM

## 2015-10-01 LAB — CBC WITH DIFFERENTIAL/PLATELET
BASOS ABS: 0 10*3/uL (ref 0.0–0.1)
Basophils Relative: 0 % (ref 0.0–3.0)
EOS PCT: 0.4 % (ref 0.0–5.0)
Eosinophils Absolute: 0.1 10*3/uL (ref 0.0–0.7)
HCT: 32.5 % — ABNORMAL LOW (ref 36.0–46.0)
HEMOGLOBIN: 10.8 g/dL — AB (ref 12.0–15.0)
LYMPHS ABS: 1.7 10*3/uL (ref 0.7–4.0)
Lymphocytes Relative: 9.8 % — ABNORMAL LOW (ref 12.0–46.0)
MCHC: 33.1 g/dL (ref 30.0–36.0)
MCV: 79.7 fl (ref 78.0–100.0)
MONOS PCT: 3.9 % (ref 3.0–12.0)
Monocytes Absolute: 0.7 10*3/uL (ref 0.1–1.0)
NEUTROS PCT: 85.9 % — AB (ref 43.0–77.0)
Neutro Abs: 14.7 10*3/uL — ABNORMAL HIGH (ref 1.4–7.7)
Platelets: 338 10*3/uL (ref 150.0–400.0)
RBC: 4.07 Mil/uL (ref 3.87–5.11)
RDW: 16.3 % — ABNORMAL HIGH (ref 11.5–15.5)
WBC: 17.1 10*3/uL — AB (ref 4.0–10.5)

## 2015-10-01 LAB — COMPREHENSIVE METABOLIC PANEL
ALT: 7 U/L (ref 0–35)
AST: 10 U/L (ref 0–37)
Albumin: 3.1 g/dL — ABNORMAL LOW (ref 3.5–5.2)
Alkaline Phosphatase: 117 U/L (ref 39–117)
BUN: 46 mg/dL — AB (ref 6–23)
CHLORIDE: 100 meq/L (ref 96–112)
CO2: 23 meq/L (ref 19–32)
Calcium: 9.9 mg/dL (ref 8.4–10.5)
Creatinine, Ser: 1.81 mg/dL — ABNORMAL HIGH (ref 0.40–1.20)
GFR: 35.53 mL/min — AB (ref >60.00)
GLUCOSE: 174 mg/dL — AB (ref 70–99)
POTASSIUM: 3.6 meq/L (ref 3.5–5.1)
SODIUM: 141 meq/L (ref 135–145)
Total Bilirubin: 0.3 mg/dL (ref 0.2–1.2)
Total Protein: 7.3 g/dL (ref 6.0–8.3)

## 2015-10-01 NOTE — Telephone Encounter (Signed)
I called the pt and informed her husband of the message below and he is aware someone will call with appt info for the CT.

## 2015-10-01 NOTE — Progress Notes (Signed)
Pre visit review using our clinic review tool, if applicable. No additional management support is needed unless otherwise documented below in the visit note. 

## 2015-10-01 NOTE — Telephone Encounter (Signed)
Spoke with Lisa Shaffer, he plans to place orders - forwarding to him.  Bruce, can you let Lisa Shaffer know if we need to call her today and recs? Thanks!

## 2015-10-01 NOTE — Telephone Encounter (Signed)
Please call pt. CXR undefined opacity RLL.  Setting up CT chest to further evaluate.  Thanks!
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
## 2015-10-01 NOTE — Telephone Encounter (Signed)
[facility] (ph#[phone]) with a call report of the chest x-ray that reveals: right lower lobe process containing opacity and potential central cavitation, further assessment by CT chest with contrast is recommended.

## 2015-10-02 ENCOUNTER — Telehealth: Payer: Self-pay | Admitting: Internal Medicine

## 2015-10-02 NOTE — Telephone Encounter (Signed)
Stacey from CT called regarding this patient that is schedule for tomorrow to have a CT done. Misty StanleyStacey wanted to know if the MD was aware of the patient creatinine level of 1.81. Misty StanleyStacey stated that they could do a limited dose if needed. Misty StanleyStacey needs a verbal ok to the contrast. Misty StanleyStacey also left a VM on CMA phone.  Stacey,CT [phone]

## 2015-10-02 NOTE — Telephone Encounter (Signed)
Dr. Caryl NeverBurchette gave a verbal order to proceed with the low dose CT scan. Misty StanleyStacey is aware.

## 2015-10-03 ENCOUNTER — Ambulatory Visit (INDEPENDENT_AMBULATORY_CARE_PROVIDER_SITE_OTHER)
Admission: RE | Admit: 2015-10-03 | Discharge: 2015-10-03 | Disposition: A | Discharge status: Home / Self Care | Payer: Medicare Other | Source: Ambulatory Visit | Attending: Family Medicine | Admitting: Family Medicine

## 2015-10-03 ENCOUNTER — Telehealth: Payer: Self-pay | Admitting: Family Medicine

## 2015-10-03 DIAGNOSIS — R634 Abnormal weight loss: Secondary | ICD-10-CM | POA: Diagnosis not present

## 2015-10-03 DIAGNOSIS — R918 Other nonspecific abnormal finding of lung field: Secondary | ICD-10-CM | POA: Diagnosis not present

## 2015-10-03 DIAGNOSIS — R938 Abnormal findings on diagnostic imaging of other specified body structures: Secondary | ICD-10-CM | POA: Diagnosis not present

## 2015-10-03 DIAGNOSIS — R9389 Abnormal findings on diagnostic imaging of other specified body structures: Secondary | ICD-10-CM

## 2015-10-03 DIAGNOSIS — R630 Anorexia: Secondary | ICD-10-CM | POA: Diagnosis not present

## 2015-10-03 MED ORDER — AMOXICILLIN-POT CLAVULANATE 875-125 MG PO TABS: 1.0000 | ORAL_TABLET | Freq: Two times a day (BID) | ORAL | Status: DC

## 2015-10-03 MED ORDER — IOPAMIDOL (ISOVUE-300) INJECTION 61%: 80.0000 mL | Freq: Once | INTRAVENOUS | Status: DC | PRN

## 2015-10-03 NOTE — Addendum Note (Signed)
Addended by: Tempie HoistMCNEIL, AUTUMN M on: 10/03/2015 02:08 PM   Modules accepted: Orders

## 2015-10-03 NOTE — Addendum Note (Signed)
Addended by: Kristian CoveyBURCHETTE, Zeinab Rodwell W on: 10/03/2015 02:39 PM   Modules accepted: Orders

## 2015-10-03 NOTE — Telephone Encounter (Signed)
i would repeat BMP by this Monday.  Stay well hydrated in meantime and hold metformin until we check BMP.

## 2015-10-03 NOTE — Telephone Encounter (Addendum)
Please advise when we should recheck labs to restart medication.

## 2015-10-03 NOTE — Telephone Encounter (Signed)
He had a CT she had CRE. She was to to stop taking her metoformin for two days and to contact the office to restart metformin. CRE was 1.81 and they gave her a limited dose of contrast and to stay hydrated.

## 2015-10-04 ENCOUNTER — Other Ambulatory Visit: Payer: Medicare Other

## 2015-10-04 NOTE — Telephone Encounter (Signed)
Pt is scheduled for 10/07/2015 for lab appt.

## 2015-10-07 ENCOUNTER — Telehealth: Payer: Self-pay | Admitting: Internal Medicine

## 2015-10-07 ENCOUNTER — Other Ambulatory Visit (INDEPENDENT_AMBULATORY_CARE_PROVIDER_SITE_OTHER): Payer: Medicare Other

## 2015-10-07 DIAGNOSIS — I10 Essential (primary) hypertension: Secondary | ICD-10-CM

## 2015-10-07 LAB — BASIC METABOLIC PANEL
BUN: 52 mg/dL — AB (ref 6–23)
CHLORIDE: 92 meq/L — AB (ref 96–112)
CO2: 21 mEq/L (ref 19–32)
Calcium: 9.4 mg/dL (ref 8.4–10.5)
Creatinine, Ser: 2.76 mg/dL — ABNORMAL HIGH (ref 0.40–1.20)
GFR: 21.83 mL/min — ABNORMAL LOW (ref >60.00)
GLUCOSE: 136 mg/dL — AB (ref 70–99)
POTASSIUM: 3.2 meq/L — AB (ref 3.5–5.1)
SODIUM: 133 meq/L — AB (ref 135–145)

## 2015-10-07 NOTE — Telephone Encounter (Signed)
Patient needs a letter to be excused from jury duty.

## 2015-10-08 ENCOUNTER — Emergency Department (HOSPITAL_COMMUNITY): Payer: Medicare Other

## 2015-10-08 ENCOUNTER — Inpatient Hospital Stay (HOSPITAL_COMMUNITY)
Admission: EM | Admit: 2015-10-08 | Discharge: 2015-10-15 | DRG: 177 | Disposition: A | Discharge status: Home Health | Payer: Medicare Other | Attending: Internal Medicine | Admitting: Internal Medicine

## 2015-10-08 ENCOUNTER — Encounter (HOSPITAL_COMMUNITY): Payer: Self-pay

## 2015-10-08 DIAGNOSIS — R059 Cough, unspecified: Secondary | ICD-10-CM

## 2015-10-08 DIAGNOSIS — Z7984 Long term (current) use of oral hypoglycemic drugs: Secondary | ICD-10-CM

## 2015-10-08 DIAGNOSIS — E876 Hypokalemia: Secondary | ICD-10-CM | POA: Diagnosis present

## 2015-10-08 DIAGNOSIS — F329 Major depressive disorder, single episode, unspecified: Secondary | ICD-10-CM | POA: Diagnosis present

## 2015-10-08 DIAGNOSIS — I69354 Hemiplegia and hemiparesis following cerebral infarction affecting left non-dominant side: Secondary | ICD-10-CM | POA: Diagnosis not present

## 2015-10-08 DIAGNOSIS — K224 Dyskinesia of esophagus: Secondary | ICD-10-CM | POA: Diagnosis present

## 2015-10-08 DIAGNOSIS — R05 Cough: Secondary | ICD-10-CM

## 2015-10-08 DIAGNOSIS — E119 Type 2 diabetes mellitus without complications: Secondary | ICD-10-CM

## 2015-10-08 DIAGNOSIS — G8194 Hemiplegia, unspecified affecting left nondominant side: Secondary | ICD-10-CM | POA: Diagnosis present

## 2015-10-08 DIAGNOSIS — Z6822 Body mass index (BMI) 22.0-22.9, adult: Secondary | ICD-10-CM | POA: Diagnosis not present

## 2015-10-08 DIAGNOSIS — R197 Diarrhea, unspecified: Secondary | ICD-10-CM | POA: Diagnosis not present

## 2015-10-08 DIAGNOSIS — Z7902 Long term (current) use of antithrombotics/antiplatelets: Secondary | ICD-10-CM | POA: Diagnosis not present

## 2015-10-08 DIAGNOSIS — E1169 Type 2 diabetes mellitus with other specified complication: Secondary | ICD-10-CM | POA: Diagnosis not present

## 2015-10-08 DIAGNOSIS — Z7982 Long term (current) use of aspirin: Secondary | ICD-10-CM | POA: Diagnosis not present

## 2015-10-08 DIAGNOSIS — J852 Abscess of lung without pneumonia: Secondary | ICD-10-CM | POA: Diagnosis not present

## 2015-10-08 DIAGNOSIS — N179 Acute kidney failure, unspecified: Secondary | ICD-10-CM | POA: Diagnosis not present

## 2015-10-08 DIAGNOSIS — R918 Other nonspecific abnormal finding of lung field: Secondary | ICD-10-CM

## 2015-10-08 DIAGNOSIS — F03B0 Unspecified dementia, moderate, without behavioral disturbance, psychotic disturbance, mood disturbance, and anxiety: Secondary | ICD-10-CM | POA: Diagnosis present

## 2015-10-08 DIAGNOSIS — I10 Essential (primary) hypertension: Secondary | ICD-10-CM | POA: Diagnosis present

## 2015-10-08 DIAGNOSIS — Z6820 Body mass index (BMI) 20.0-20.9, adult: Secondary | ICD-10-CM | POA: Diagnosis not present

## 2015-10-08 DIAGNOSIS — Z993 Dependence on wheelchair: Secondary | ICD-10-CM | POA: Diagnosis not present

## 2015-10-08 DIAGNOSIS — E785 Hyperlipidemia, unspecified: Secondary | ICD-10-CM | POA: Diagnosis present

## 2015-10-08 DIAGNOSIS — R1310 Dysphagia, unspecified: Secondary | ICD-10-CM | POA: Diagnosis present

## 2015-10-08 DIAGNOSIS — E46 Unspecified protein-calorie malnutrition: Secondary | ICD-10-CM | POA: Diagnosis not present

## 2015-10-08 DIAGNOSIS — F0390 Unspecified dementia without behavioral disturbance: Secondary | ICD-10-CM | POA: Diagnosis present

## 2015-10-08 DIAGNOSIS — R112 Nausea with vomiting, unspecified: Secondary | ICD-10-CM | POA: Diagnosis present

## 2015-10-08 DIAGNOSIS — R1314 Dysphagia, pharyngoesophageal phase: Secondary | ICD-10-CM | POA: Diagnosis not present

## 2015-10-08 DIAGNOSIS — R630 Anorexia: Secondary | ICD-10-CM | POA: Diagnosis present

## 2015-10-08 DIAGNOSIS — E43 Unspecified severe protein-calorie malnutrition: Secondary | ICD-10-CM | POA: Diagnosis present

## 2015-10-08 DIAGNOSIS — E869 Volume depletion, unspecified: Secondary | ICD-10-CM

## 2015-10-08 DIAGNOSIS — Z8673 Personal history of transient ischemic attack (TIA), and cerebral infarction without residual deficits: Secondary | ICD-10-CM | POA: Diagnosis not present

## 2015-10-08 DIAGNOSIS — R1311 Dysphagia, oral phase: Secondary | ICD-10-CM | POA: Diagnosis not present

## 2015-10-08 DIAGNOSIS — R1110 Vomiting, unspecified: Secondary | ICD-10-CM

## 2015-10-08 HISTORY — DX: Cerebral infarction, unspecified: I63.9

## 2015-10-08 LAB — CBC WITH DIFFERENTIAL/PLATELET
Basophils Absolute: 0 10*3/uL (ref 0.0–0.1)
Basophils Relative: 0 %
EOS PCT: 1 %
Eosinophils Absolute: 0.1 10*3/uL (ref 0.0–0.7)
HEMATOCRIT: 30.6 % — AB (ref 36.0–46.0)
Hemoglobin: 10.6 g/dL — ABNORMAL LOW (ref 12.0–15.0)
LYMPHS ABS: 0.8 10*3/uL (ref 0.7–4.0)
LYMPHS PCT: 7 %
MCH: 25.9 pg — AB (ref 26.0–34.0)
MCHC: 34.6 g/dL (ref 30.0–36.0)
MCV: 74.6 fL — AB (ref 78.0–100.0)
MONO ABS: 1 10*3/uL (ref 0.1–1.0)
Monocytes Relative: 8 %
NEUTROS ABS: 9.5 10*3/uL — AB (ref 1.7–7.7)
Neutrophils Relative %: 84 %
PLATELETS: 356 10*3/uL (ref 150–400)
RBC: 4.1 MIL/uL (ref 3.87–5.11)
RDW: 15.2 % (ref 11.5–15.5)
WBC: 11.4 10*3/uL — AB (ref 4.0–10.5)

## 2015-10-08 LAB — URINALYSIS, ROUTINE W REFLEX MICROSCOPIC
Bilirubin Urine: NEGATIVE
Glucose, UA: NEGATIVE mg/dL
Hgb urine dipstick: NEGATIVE
Ketones, ur: NEGATIVE mg/dL
LEUKOCYTES UA: NEGATIVE
NITRITE: NEGATIVE
Protein, ur: NEGATIVE mg/dL
SPECIFIC GRAVITY, URINE: 1.013 (ref 1.005–1.030)
pH: 5.5 (ref 5.0–8.0)

## 2015-10-08 LAB — COMPREHENSIVE METABOLIC PANEL
ALK PHOS: 93 U/L (ref 38–126)
ALT: 11 U/L — AB (ref 14–54)
AST: 14 U/L — ABNORMAL LOW (ref 15–41)
Albumin: 2.5 g/dL — ABNORMAL LOW (ref 3.5–5.0)
Anion gap: 18 — ABNORMAL HIGH (ref 5–15)
BILIRUBIN TOTAL: 1.1 mg/dL (ref 0.3–1.2)
BUN: 43 mg/dL — ABNORMAL HIGH (ref 6–20)
CHLORIDE: 99 mmol/L — AB (ref 101–111)
CO2: 21 mmol/L — ABNORMAL LOW (ref 22–32)
Calcium: 9.6 mg/dL (ref 8.9–10.3)
Creatinine, Ser: 2.11 mg/dL — ABNORMAL HIGH (ref 0.44–1.00)
GFR, EST AFRICAN AMERICAN: 26 mL/min — AB (ref >60)
GFR, EST NON AFRICAN AMERICAN: 23 mL/min — AB (ref >60)
Glucose, Bld: 174 mg/dL — ABNORMAL HIGH (ref 65–99)
Potassium: 3.1 mmol/L — ABNORMAL LOW (ref 3.5–5.1)
Sodium: 138 mmol/L (ref 135–145)
Total Protein: 7.9 g/dL (ref 6.5–8.1)

## 2015-10-08 LAB — I-STAT CG4 LACTIC ACID, ED: LACTIC ACID, VENOUS: 1.18 mmol/L (ref 0.5–2.0)

## 2015-10-08 LAB — GLUCOSE, CAPILLARY: Glucose-Capillary: 126 mg/dL — ABNORMAL HIGH (ref 65–99)

## 2015-10-08 MED ORDER — ADULT MULTIVITAMIN W/MINERALS CH
1.0000 | ORAL_TABLET | Freq: Every day | ORAL | Status: DC
  Administered 2015-10-08 – 2015-10-15 (×7): 1 via ORAL
  Filled 2015-10-08 (×8): qty 1

## 2015-10-08 MED ORDER — SODIUM CHLORIDE 0.9 % IV BOLUS (SEPSIS)
1000.0000 mL | Freq: Once | INTRAVENOUS | Status: AC
  Administered 2015-10-08: 1000 mL via INTRAVENOUS

## 2015-10-08 MED ORDER — INSULIN ASPART 100 UNIT/ML ~~LOC~~ SOLN: 0.0000 [IU] | Freq: Every day | SUBCUTANEOUS | Status: DC

## 2015-10-08 MED ORDER — POTASSIUM CHLORIDE IN NACL 20-0.45 MEQ/L-% IV SOLN
INTRAVENOUS | Status: DC
  Administered 2015-10-08 – 2015-10-09 (×2): via INTRAVENOUS
  Filled 2015-10-08 (×4): qty 1000

## 2015-10-08 MED ORDER — ASPIRIN 81 MG PO CHEW
81.0000 mg | CHEWABLE_TABLET | Freq: Every day | ORAL | Status: DC
  Administered 2015-10-08 – 2015-10-15 (×8): 81 mg via ORAL
  Filled 2015-10-08 (×8): qty 1

## 2015-10-08 MED ORDER — INSULIN ASPART 100 UNIT/ML ~~LOC~~ SOLN
0.0000 [IU] | Freq: Three times a day (TID) | SUBCUTANEOUS | Status: DC
  Administered 2015-10-09 – 2015-10-10 (×4): 1 [IU] via SUBCUTANEOUS
  Administered 2015-10-10 – 2015-10-13 (×2): 2 [IU] via SUBCUTANEOUS
  Administered 2015-10-14: 1 [IU] via SUBCUTANEOUS
  Administered 2015-10-15: 2 [IU] via SUBCUTANEOUS

## 2015-10-08 MED ORDER — CLOPIDOGREL BISULFATE 75 MG PO TABS
75.0000 mg | ORAL_TABLET | Freq: Every day | ORAL | Status: DC
  Administered 2015-10-09 – 2015-10-15 (×7): 75 mg via ORAL
  Filled 2015-10-08 (×7): qty 1

## 2015-10-08 MED ORDER — ONDANSETRON HCL 4 MG PO TABS: 4.0000 mg | ORAL_TABLET | Freq: Four times a day (QID) | ORAL | Status: DC | PRN

## 2015-10-08 MED ORDER — ACETAMINOPHEN 650 MG RE SUPP
650.0000 mg | Freq: Four times a day (QID) | RECTAL | Status: DC | PRN
Start: 2015-10-08 — End: 2015-10-15

## 2015-10-08 MED ORDER — DONEPEZIL HCL 10 MG PO TABS
10.0000 mg | ORAL_TABLET | Freq: Every day | ORAL | Status: DC
  Administered 2015-10-08 – 2015-10-14 (×7): 10 mg via ORAL
  Filled 2015-10-08 (×9): qty 1

## 2015-10-08 MED ORDER — MEMANTINE HCL ER 28 MG PO CP24
28.0000 mg | ORAL_CAPSULE | Freq: Every day | ORAL | Status: DC
  Administered 2015-10-08 – 2015-10-15 (×8): 28 mg via ORAL
  Filled 2015-10-08 (×8): qty 1

## 2015-10-08 MED ORDER — ACETAMINOPHEN 325 MG PO TABS
650.0000 mg | ORAL_TABLET | Freq: Four times a day (QID) | ORAL | Status: DC | PRN
  Filled 2015-10-08: qty 2

## 2015-10-08 MED ORDER — ONDANSETRON HCL 4 MG/2ML IJ SOLN
4.0000 mg | Freq: Four times a day (QID) | INTRAMUSCULAR | Status: DC | PRN
  Administered 2015-10-10: 4 mg via INTRAVENOUS
  Filled 2015-10-08: qty 2

## 2015-10-08 MED ORDER — SERTRALINE HCL 100 MG PO TABS
100.0000 mg | ORAL_TABLET | Freq: Every day | ORAL | Status: DC
  Administered 2015-10-08 – 2015-10-15 (×8): 100 mg via ORAL
  Filled 2015-10-08 (×8): qty 1

## 2015-10-08 MED ORDER — ATORVASTATIN CALCIUM 40 MG PO TABS
40.0000 mg | ORAL_TABLET | Freq: Every day | ORAL | Status: DC
  Administered 2015-10-08 – 2015-10-15 (×7): 40 mg via ORAL
  Filled 2015-10-08 (×8): qty 1
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
## 2015-10-08 NOTE — ED Provider Notes (Signed)
Medical screening examination/treatment/procedure(s) were conducted as a shared visit with non-physician practitioner(s) and myself.  I personally evaluated the patient during the encounter.  70  Yo F w/ left sided stroke in December, rehab and did fine. Over last couple weeks has had episodes with intermttent vomiting and last two days more persistent dysphagia. Patient has had no complaints to her family and on their ROS has been fine. Here she has no complaints. PCP did a ct chest recently that showed some pulmonary nodules/possible pulmonary abscess.  Exam with tachycardia, dry skin, dry MM c/w moderate dehydration. Abdomen benign. Rest of exam normal.  Concern for infection, achalasia or mass obstruction causing symptoms.  Plan to repeat labs, discuss with GI about proper management. Fluid hydrate in mean time.   Discussed with Dr. Vassie LollAlva with pulm who will see patient as an inpatient.  Discussed with Dr. Madilyn FiremanHayes with GI who will see patient as an inpatient.  Discussed with Dr. Arlean HoppingSchertz with [facility] who will admit patient.   Marily MemosJason Adara Kittle, MD 10/09/15 1344

## 2015-10-08 NOTE — ED Notes (Signed)
Starting the 20 min timer

## 2015-10-08 NOTE — ED Notes (Signed)
MD Provider in room talking to pt

## 2015-10-08 NOTE — Consult Note (Signed)
Name: Lisa Shaffer Taira MRN: [id number] DOB: 05/07/1946    ADMISSION DATE:  10/08/2015 CONSULTATION DATE:  4/4  REFERRING MD :  TRH  CHIEF COMPLAINT:  SOB  HISTORY OF PRESENT ILLNESS:  70 year old female with PMH as below, which includes recent CVA, DM, HTN, and dementia. She was admitted to [facility] 07/2015 for R CVA with L hemiparesis and confinement to a wheelchair. Hospital course complicated by fall and worsening of CVA on MRI. She was discharged to rehab, where inially she was doing well until she began to have decreased PO intake (mid January 2017), which was complicated by nausea vomiting. In the last three weeks prior to admission, she has refused to take anything by mouth including medications due to difficulty swallowing.  She was seem by PCP 3/28 for these complaints where a 20 pound weight loss was noted. She also had complaints of cough at that time. CXR was concerning for cavitary lesion and CT was ordered, she was also started on augmentin. 3/30 she went for CT which demonstrated possible lung abscess RLL. In the emergency department family reports she has had nausea and vomiting intermittently for the past 2 weeks. Patient has since reported that she has not been able to swallow well. She was admitted by the hospitalists and PCCM was asked to see in consultation for possible lung abscess.   PAST MEDICAL HISTORY :   has a past medical history of Diabetes mellitus; Hyperlipidemia; Hypertension; Hyperparathyroidism (HCC); Dementia (10/14); and Stroke (HCC).  has past surgical history that includes Ankle surgery (1991) and Cataract extraction.   Prior to Admission medications   Medication Sig Start Date End Date Taking? Authorizing Provider  amoxicillin-clavulanate (AUGMENTIN) 875-125 MG tablet Take 1 tablet by mouth 2 (two) times daily. 10/03/15   Kristian CoveyBruce W Burchette, MD  aspirin 81 MG tablet Take 81 mg by mouth daily.      Historical Provider, MD  atorvastatin (LIPITOR) 40 MG tablet Take 40  mg by mouth daily. Reported on 08/22/2015    Historical Provider, MD  clopidogrel (PLAVIX) 75 MG tablet Take 75 mg by mouth daily.    Historical Provider, MD  donepezil (ARICEPT) 10 MG tablet Take 1 tablet (10 mg total) by mouth at bedtime. 12/31/14   Van ClinesKaren M Aquino, MD  furosemide (LASIX) 20 MG tablet TAKE 1/2 TABLET BY MOUTH DAILY 07/04/15   Carlus Pavlovristina Gherghe, MD  hydrochlorothiazide (MICROZIDE) 12.5 MG capsule Take 12.5 mg by mouth daily.    Historical Provider, MD  memantine (NAMENDA XR) 28 MG CP24 24 hr capsule Take 1 capsule (28 mg total) by mouth daily. 12/31/14   Van ClinesKaren M Aquino, MD  metFORMIN (GLUCOPHAGE) 500 MG tablet Take 1 tablet (500 mg total) by mouth 2 (two) times daily with a meal. 01/17/15 02/06/16  Doe-Hyun R Artist PaisYoo, DO  Multiple Vitamin (MULTIVITAMIN) tablet Take 1 tablet by mouth daily.      Historical Provider, MD  NIFEdipine (PROCARDIA XL/ADALAT-CC) 90 MG 24 hr tablet Take 1 tablet (90 mg total) by mouth daily. 02/11/15   Doe-Hyun R Artist PaisYoo, DO  QUEtiapine (SEROQUEL) 25 MG tablet TAKE 1/2 TABLET BY MOUTH AT BEDTIME AS NEEDED. INCREASE CONFUSION 07/03/15   Van ClinesKaren M Aquino, MD  sertraline (ZOLOFT) 100 MG tablet TAKE 1 TABLET BY MOUTH DAILY 08/26/15   Doe-Hyun R Artist PaisYoo, DO  valsartan (DIOVAN) 320 MG tablet TAKE 1 TABLET BY MOUTH DAILY 09/30/15   Doe-Hyun Sherran Needs Yoo, DO   No Known Allergies  FAMILY HISTORY:  family history  includes Cancer in her mother; Diabetes in her paternal aunt, paternal grandmother, and paternal uncle; Heart disease in her father; Hypertension in her father; Kidney disease in her father.   SOCIAL HISTORY:  reports that she has never smoked. She has never used smokeless tobacco. She reports that she does not drink alcohol or use illicit drugs.  REVIEW OF SYSTEMS:  Bolds are positive  Constitutional: weight loss, gain, night sweats, Fevers, chills, fatigue .  HEENT: headaches, Sore throat, sneezing, nasal congestion, post nasal drip, Difficulty swallowing, Tooth/dental problems,  visual complaints visual changes, ear ache CV:  chest pain, radiates: ,orthopnea, PND, swelling in lower extremities, dizziness, palpitations, syncope.  GI  heartburn, indigestion, abdominal pain, nausea, vomiting, diarrhea, change in bowel habits, loss of appetite, bloody stools, inability to swallow, "spitting up foods" (not actual vomiting) Resp: dry persistent cough, productive: , hemoptysis, dyspnea, chest pain, pleuritic.  Skin: rash or itching or icterus GU: dysuria, change in color of urine, urgency or frequency. flank pain, hematuria  MS: joint pain or swelling. decreased range of motion  Psych: change in mood or affect. depression or anxiety.  Neuro: difficulty with speech, weakness, numbness, ataxia    SUBJECTIVE:   VITAL SIGNS: Temp:  [98 F (36.7 C)-98.3 F (36.8 C)] 98 F (36.7 C) (04/04 1624) Pulse Rate:  [95-107] 95 (04/04 1735) Resp:  [18] 18 (04/04 1735) BP: (112-122)/(62-73) 122/73 mmHg (04/04 1735) SpO2:  [95 %-99 %] 99 % (04/04 1735) Weight:  [51.256 kg (113 lb)] 51.256 kg (113 lb) (04/04 1425)  PHYSICAL EXAMINATION: General:  Chronically ill appearing female in NAD Neuro:  AAOx4, speech with mild dysarthria / occasional difficulty with word finding, normal strength on R, hemiparesis on L HEENT:  MM pink/moist, no jvd Cardiovascular:  s1s2 rrr, no m/r/g  Lungs:  Even/non-labored, lungs bilaterally clear without wheeze  Abdomen:  Soft/non-tender, bsx4 active Musculoskeletal:  No acute deformities  Skin:  Warm/dry, no edema    Recent Labs Lab 10/07/15 1033 10/08/15 1454  NA 133* 138  K 3.2* 3.1*  CL 92* 99*  CO2 21 21*  BUN 52* 43*  CREATININE 2.76* 2.11*  GLUCOSE 136* 174*    Recent Labs Lab 10/08/15 1454  HGB 10.6*  HCT 30.6*  WBC 11.4*  PLT 356   Dg Chest 2 View  10/08/2015  CLINICAL DATA:  Pulmonary abscess. EXAM: CHEST  2 VIEW COMPARISON:  CT scan of October 03, 2015. Radiograph of October 01, 2015. FINDINGS: The heart size and mediastinal  contours are within normal limits. No pneumothorax or pleural effusion is noted. Lingular subsegmental atelectasis or scarring is noted. Rounded abnormality is seen in the right lower lobe consistent with probable pulmonary abscess noted on prior study. It appears to have nearly completely filled with fluid at this time. The visualized skeletal structures are unremarkable. IMPRESSION: Continued presence of rounded abnormality seen in right lower lobe most consistent with pulmonary abscess, as it has filled in with fluid completely since prior exam. Neoplasm or malignancy cannot be excluded. Electronically Signed   By: Lupita Raider, M.D.   On: 10/08/2015 16:06    SIGNIFICANT EVENTS  06/2015 - 07/2015  Admit to [facility] with R CVA. Resultant L hemiparesis since confined to wheelchair 4/04  Admit for failure to thrive, concern for pulm abscess  STUDIES:  3/30  CT abdomen+ chest >> There is a gas and fluid containing mass within the right lower lobe which may represent a pulmonary abscess. Less likely consideration is cavitary neoplasm.  Subpleural consolidation within the left and right lower lobes   ASSESSMENT / PLAN:  Large (>53mm) RLL Cavitary Lesion - patient with hx of CVA with resultant hemiparesis, difficulties swallowing, 20 lb weight loss and dry persistent cough.  Primary concern is for recurrent aspiration with development of a cavitary lesion.  DDx for cavitary lesion also include - malignancy, vasculitis, bullae with air fluid level, PE with infarct, sarcoidosis and bronchostenosis.    Plan: No role for FOB / TCTS evaluation at this time Culture data if any, would likely be polymicrobial in nature Begin Augmentin with plan for 6 weeks total  Repeat CT imaging in 4 weeks to ensure clearance of lesion SLP / GI evaluation for swallowing needs / diet recommendations Monitor for clinical changes   Canary Brim, NP-C [facility] Pgr: [phone] or if no answer  [phone] 10/09/2015, 8:42 AM   PCCM Attending Note: Patient seen and examined with nurse practitioner. Please refer to her consult note which I reviewed in detail. Patient reports 3 weeks of cough. Denies any subjective fever, chills, or sweats. Denies any chest pain or discomfort.  BP 120/70 mmHg  Pulse 97  Temp(Src) 98.5 F (36.9 C) (Oral)  Resp 18  Ht 5' (1.524 m)  Wt 113 lb (51.256 kg)  BMI 22.07 kg/m2  SpO2 98% Gen.: Laying in bed. No distress. Awake. Integument: Warm and dry. No rash exposed skin. Pulmonary: Normal work of breathing on nasal cannula oxygen. Mild crackles right lung base. Symmetric chest wall expansion. Lymphatics: No appreciated cervical or supraclavicular lymphadenopathy.  CBC Latest Ref Rng 10/09/2015 10/08/2015 10/01/2015  WBC 4.0 - 10.5 K/uL 12.0(H) 11.4(H) 17.1(H)  Hemoglobin 12.0 - 15.0 g/dL 4.5(W) 10.6(L) 10.8(L)  Hematocrit 36.0 - 46.0 % 24.7(L) 30.6(L) 32.5(L)  Platelets 150 - 400 K/uL 297 356 338.0    BMP Latest Ref Rng 10/09/2015 10/08/2015 10/07/2015  Glucose 65 - 99 mg/dL 098(J) 191(Y) 782(N)  BUN 6 - 20 mg/dL 56(O) 13(Y) 86(V)  Creatinine 0.44 - 1.00 mg/dL 7.84(O) 9.62(X) 5.28(U)  Sodium 135 - 145 mmol/L 138 138 133(L)  Potassium 3.5 - 5.1 mmol/L 3.0(L) 3.1(L) 3.2(L)  Chloride 101 - 111 mmol/L 104 99(L) 92(L)  CO2 22 - 32 mmol/L 21(L) 21(L) 21  Calcium 8.9 - 10.3 mg/dL 1.3(K) 9.6 9.4    A/Shaffer:  70 year old female with dysphagia. Thick walled cavitary lesion within right lower lobe consistent with abscess. Patient is currently nontoxic. She will require at least a four-week course of antibiotics with repeat imaging.  1. Lung abscess: Plan for 4-6 weeks of Augmentin twice daily with repeat CT imaging at that time. Further antibiotics guided by findings on CT imaging. 2. Probable aspiration: Recommend speech therapy evaluation. 3. Follow-up: We will arrange a follow-up appointment in our office around the time of her CT scan.  Donna Christen Jamison Neighbor,  M.D. [facility] Pager:  [phone] After 3pm or if no response, call [phone] 11:07 AM 10/09/2015

## 2015-10-08 NOTE — Telephone Encounter (Signed)
Ok [place]

## 2015-10-08 NOTE — ED Notes (Signed)
Patient and patient's family report that the patient has been intermittent vomiting after eating x 2 weeks. Family states today, the patient ahd pears and has been able to keep the pears down. Family states the patient told them that she could not swallow good. Patient has been taking her meds until the past 2 days.
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
## 2015-10-09 ENCOUNTER — Inpatient Hospital Stay (HOSPITAL_COMMUNITY): Payer: Medicare Other

## 2015-10-09 DIAGNOSIS — G8194 Hemiplegia, unspecified affecting left nondominant side: Secondary | ICD-10-CM

## 2015-10-09 DIAGNOSIS — R1310 Dysphagia, unspecified: Secondary | ICD-10-CM

## 2015-10-09 DIAGNOSIS — E119 Type 2 diabetes mellitus without complications: Secondary | ICD-10-CM

## 2015-10-09 DIAGNOSIS — I10 Essential (primary) hypertension: Secondary | ICD-10-CM

## 2015-10-09 DIAGNOSIS — E43 Unspecified severe protein-calorie malnutrition: Secondary | ICD-10-CM | POA: Diagnosis present

## 2015-10-09 DIAGNOSIS — N179 Acute kidney failure, unspecified: Secondary | ICD-10-CM

## 2015-10-09 LAB — COMPREHENSIVE METABOLIC PANEL
ALK PHOS: 77 U/L (ref 38–126)
ALT: 8 U/L — ABNORMAL LOW (ref 14–54)
ANION GAP: 13 (ref 5–15)
AST: 10 U/L — ABNORMAL LOW (ref 15–41)
Albumin: 2 g/dL — ABNORMAL LOW (ref 3.5–5.0)
BUN: 30 mg/dL — ABNORMAL HIGH (ref 6–20)
CALCIUM: 8.7 mg/dL — AB (ref 8.9–10.3)
CO2: 21 mmol/L — AB (ref 22–32)
Chloride: 104 mmol/L (ref 101–111)
Creatinine, Ser: 1.49 mg/dL — ABNORMAL HIGH (ref 0.44–1.00)
GFR calc non Af Amer: 34 mL/min — ABNORMAL LOW (ref >60)
GFR, EST AFRICAN AMERICAN: 40 mL/min — AB (ref >60)
Glucose, Bld: 105 mg/dL — ABNORMAL HIGH (ref 65–99)
POTASSIUM: 3 mmol/L — AB (ref 3.5–5.1)
SODIUM: 138 mmol/L (ref 135–145)
TOTAL PROTEIN: 6.5 g/dL (ref 6.5–8.1)
Total Bilirubin: 0.7 mg/dL (ref 0.3–1.2)

## 2015-10-09 LAB — PROTIME-INR
INR: 1.29 (ref 0.00–1.49)
PROTHROMBIN TIME: 15.8 s — AB (ref 11.6–15.2)

## 2015-10-09 LAB — GLUCOSE, CAPILLARY
GLUCOSE-CAPILLARY: 139 mg/dL — AB (ref 65–99)
Glucose-Capillary: 105 mg/dL — ABNORMAL HIGH (ref 65–99)
Glucose-Capillary: 125 mg/dL — ABNORMAL HIGH (ref 65–99)
Glucose-Capillary: 121 mg/dL — ABNORMAL HIGH (ref 65–99)

## 2015-10-09 LAB — MAGNESIUM: Magnesium: 1.8 mg/dL (ref 1.7–2.4)

## 2015-10-09 LAB — CBC
HCT: 24.7 % — ABNORMAL LOW (ref 36.0–46.0)
HEMOGLOBIN: 8.7 g/dL — AB (ref 12.0–15.0)
MCH: 25.7 pg — AB (ref 26.0–34.0)
MCHC: 35.2 g/dL (ref 30.0–36.0)
MCV: 73.1 fL — ABNORMAL LOW (ref 78.0–100.0)
Platelets: 297 10*3/uL (ref 150–400)
RBC: 3.38 MIL/uL — AB (ref 3.87–5.11)
RDW: 15.3 % (ref 11.5–15.5)
WBC: 12 10*3/uL — ABNORMAL HIGH (ref 4.0–10.5)

## 2015-10-09 LAB — URINE CULTURE
CULTURE: NO GROWTH
SPECIAL REQUESTS: NORMAL

## 2015-10-09 MED ORDER — PIPERACILLIN-TAZOBACTAM 3.375 G IVPB
3.3750 g | Freq: Three times a day (TID) | INTRAVENOUS | Status: DC
  Administered 2015-10-09 – 2015-10-13 (×12): 3.375 g via INTRAVENOUS
  Filled 2015-10-09 (×13): qty 50

## 2015-10-09 MED ORDER — POTASSIUM CHLORIDE CRYS ER 20 MEQ PO TBCR
40.0000 meq | EXTENDED_RELEASE_TABLET | ORAL | Status: AC
  Administered 2015-10-09 (×2): 40 meq via ORAL
  Filled 2015-10-09 (×2): qty 2

## 2015-10-09 MED ORDER — SODIUM CHLORIDE 0.45 % IV SOLN
INTRAVENOUS | Status: DC
  Administered 2015-10-09 – 2015-10-10 (×2): via INTRAVENOUS
  Filled 2015-10-09 (×7): qty 1000

## 2015-10-09 MED ORDER — ENSURE ENLIVE PO LIQD: 237.0000 mL | Freq: Three times a day (TID) | ORAL | Status: DC

## 2015-10-09 MED ORDER — SODIUM CHLORIDE 0.45 % IV SOLN: INTRAVENOUS | Status: DC

## 2015-10-09 MED ORDER — POTASSIUM CHLORIDE CRYS ER 20 MEQ PO TBCR: 40.0000 meq | EXTENDED_RELEASE_TABLET | ORAL | Status: DC
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
## 2015-10-09 NOTE — Progress Notes (Signed)
Previous shift RN stated Pt not voiding. Bladder scan reveals . MD notified, no orders at this time. Will continue to monitor.

## 2015-10-09 NOTE — Progress Notes (Signed)
Initial Nutrition Assessment  DOCUMENTATION CODES:   Severe malnutrition in context of chronic illness  INTERVENTION:  -Pending on speech evaluation, will order Ensure Enlive TID. Each supplement will provide 350 kcals and 20 grams of protein.  -Continue to monitor for nutritional needs   NUTRITION DIAGNOSIS:   Malnutrition related to vomiting, nausea, other (see comment) (refusal to eat) as evidenced by percent weight loss, energy intake < 75% for > 7 days, mild depletion of muscle mass, mild depletion of body fat.  GOAL:   Patient will meet greater than or equal to 90% of their needs  MONITOR:   PO intake, Supplement acceptance, Labs, Weight trends, I & O's, Skin  REASON FOR ASSESSMENT:   Malnutrition Screening Tool    ASSESSMENT:   70 y.o. female w history of HTN, DM, dementia had recent acute CVA w left hemiparesis managed at [facility] in jan 2017. Review of Care Everywhere says patient had multiple acute R pontine infarcts on MRI, likely due to atherosclerosis. Has some intracranial stenosis by CTA. She had fall in hospital and and repeat MRI showed worsening expansion of stroke. She was dc'd on asa/ plavix and statin to [facility] for Rehab. Then was doing home rehab and was improving until about 2 wks ago started to refuse to eat, then some nausea/ vomiting. They changed to liquids w Ensure and she did that for a while then stopped drinking Ensure, now refuses to eat or drink anything or take pills. Family not sure if she is having dysphagia or not vs just not wanting to eat. PCP did CT scan on 3/30 of chest and abdomen which showed a RLL mass/ abscess appearance. Was put on Augmentin but hasn't taken the last few days. Today labs showed Nicola Girt and pt sent to ED. Asked to see for AKI, n/v and possible lung abscess.    Pt seen for MST. Pt BMI is categorized as healthy. Pt with dementia and inability to answer questions coherently at time of visit. Per chart, pt has  experienced a 16% weight loss since 06/24/2015 which is significant for time frame. Pt has lost 22 lbs since 06/24/15. Per chart, barium swallow should be performed if she can cooperate. Pt currently on soft diet. Per chart, pt experienced a CVA in January causing left sided hemiparesis causing her to become bound to wheelchair.   Pt with no family or visitors at bedside. Unable to obtain accurate information. Intern spoke with RN and student. RN said pt's husband fed her this morning and she did eat some breakfast. RN unsure of what pt ate. Pt coughed after eating bites of food. Nursing student stated pt coughing while she gave her meds in applesauce. Pt spit up flem after taking meds this morning. Student also reports pt hesitating before swallowing. Intern asked RN about liquid and RN reports difficulty swallowing juice. Speech has not seen pt yet. Will hold off on ordering Ensure because pt seems to be having trouble swallowing liquids.    NFPE: Mild muscle depletion, mild fat depletion, no edema.   Labs reviewed; CBGs 105-126 mg/dl, K 3.0 mmol/L, BUN 30 mg/dl, Creatinine 1.61 mg/dl, Ca 8.7 mg/dl, GFR 40 ml/min.   Meds reviewed; MVI, KCl 40 mEq, IV NS with KCl 40 mEq.  Diet Order:  DIET SOFT Room service appropriate?: Yes; Fluid consistency:: Thin  Skin:  Reviewed, no issues  Last BM:  4/4  Height:   Ht Readings from Last 1 Encounters:  10/08/15 5' (1.524 m)  Weight:   Wt Readings from Last 1 Encounters:  10/08/15 113 lb (51.256 kg)    Ideal Body Weight:  45 kg  BMI:  Body mass index is 22.07 kg/(m^2).  Estimated Nutritional Needs:   Kcal:  1300-1550 (25-30 kcal/kg)  Protein:  75-85 g (1.5-1.6 g/kg)  Fluid:  1.6-1.8 L  EDUCATION NEEDS:   No education needs identified at this time  Beryle QuantMeredith Vicenta Olds, MS [employer] Dietetic Intern Pager [phone]

## 2015-10-09 NOTE — Progress Notes (Signed)
Pt unable to void in PM; bladder scan obtained and 170 ml noted; MD notified of pt being unable to void; primary RN notified; will contine to monitor

## 2015-10-09 NOTE — Progress Notes (Signed)
[facility] PROGRESS NOTE  Lisa Shaffer WUJ:[id number] DOB: 06/04/1946 DOA: 10/08/2015 PCP: [name], DO  Assessment/Plan: #1 lung abscess Per chest x-ray and per CT chest. Patient currently afebrile. Patient noted to have a leukocytosis. Patient denies any shortness of breath. Patient has been seen in consultation by pulmonary were recommending 4-6 weeks of antibiotics of Augmentin twice daily with repeat CT imaging at that time and further antibiotics guided by CT findings. Patient is to follow-up with pulmonary in outpatient setting. Due to recurrent dysphagia issues will place patient on IV Zosyn for now until dysphagia has been fully evaluated and patient able to tolerate oral intake.  #2 dysphagia Questionable etiology. Patient has been seen by gastroenterology will have ordered a barium swallow which is showing no evidence of facial or hernia or other focal esophageal abnormality. Mild esophageal dysmotility, likely presbyesophagus. Will have speech therapy assess. GI following and appreciate input and recommendations.  #3 hypokalemia Replete.  #4 anemia Likely chronic in nature and dilutional. Check an anemia panel. Follow H&H.  #5 leukocytosis Likely secondary to problem #1. Urinalysis is unremarkable. Will place on IV Zosyn.  #6 acute renal failure Likely secondary to volume depletion and possibly contrast induced in the setting of ARB. ARB on hold. Patient on IV fluids. Patient with poor urine output. Renal function trending down. Continue IV fluids. Follow.  #7 history of recent pontine CVA/left hemiparesis generated 2017 Continue aspirin/Plavix for secondary probe stroke prevention.  #8 dementia Stable. Continue Namenda.  #9 diabetes mellitus Oral hypoglycemic agents on hold. Sliding scale insulin.  #10 depression Continue Zoloft.  #11 hypertension ARB and diuretics on hold. BP stable. Follow.  #12 protein calorie malnuitrition  #13 prophylaxis SCDs for  DVT prophylaxis.  Code Status: Full Family Communication: Updated patient. No family present. Disposition Plan: Home once medically stable and dysphagia evaluated and patient tolerating a diet.   Consultants:  Pulmonary: [name] 10/08/2015  Gastroenterology: Dr. Madilyn Fireman 10/09/2015  Procedures:  CT chest/ abdomen/pelvis 10/03/2015  Chest x-ray 10/08/2015  Barium swallow 10/09/2015   Antibiotics:  IV Zosyn 10/09/2015  HPI/Subjective: Patient is complaining of difficulty swallowing and states when she swallows he feels like food comes back up. Patient otherwise denies any shortness of breath. Denies any chest pain.  Objective: Filed Vitals:   10/09/15 0900 10/09/15 1033  BP: 125/69 120/70  Pulse: 98 97  Temp: 98.3 F (36.8 C) 98.5 F (36.9 C)  Resp: 18 18    Intake/Output Summary (Last 24 hours) at 10/09/15 1438 Last data filed at 10/09/15 0450  Gross per 24 hour  Intake 1057.5 ml  Output      0 ml  Net 1057.5 ml   Filed Weights   10/08/15 1425  Weight: 51.256 kg (113 lb)    Exam:   General:  NAD  Cardiovascular: RRR  Respiratory: CTAB  Abdomen: Soft, nontender, nondistended, positive bowel sounds.  Musculoskeletal: No clubbing cyanosis or edema.  Data Reviewed: Basic Metabolic Panel:  Recent Labs Lab 10/07/15 1033 10/08/15 1454 10/09/15 0507  NA 133* 138 138  K 3.2* 3.1* 3.0*  CL 92* 99* 104  CO2 21 21* 21*  GLUCOSE 136* 174* 105*  BUN 52* 43* 30*  CREATININE 2.76* 2.11* 1.49*  CALCIUM 9.4 9.6 8.7*  MG  --   --  1.8   Liver Function Tests:  Recent Labs Lab 10/08/15 1454 10/09/15 0507  AST 14* 10*  ALT 11* 8*  ALKPHOS 93 77  BILITOT 1.1 0.7  PROT 7.9  6.5  ALBUMIN 2.5* 2.0*   No results for input(s): LIPASE, AMYLASE in the last 168 hours. No results for input(s): AMMONIA in the last 168 hours. CBC:  Recent Labs Lab 10/08/15 1454 10/09/15 0507  WBC 11.4* 12.0*  NEUTROABS 9.5*  --   HGB 10.6* 8.7*  HCT 30.6* 24.7*   MCV 74.6* 73.1*  PLT 356 297   Cardiac Enzymes: No results for input(s): CKTOTAL, CKMB, CKMBINDEX, TROPONINI in the last 168 hours. BNP (last 3 results) No results for input(s): BNP in the last 8760 hours.  ProBNP (last 3 results) No results for input(s): PROBNP in the last 8760 hours.  CBG:  Recent Labs Lab 10/08/15 2203 10/09/15 0755 10/09/15 1209  GLUCAP 126* 105* 125*    Recent Results (from the past 240 hour(s))  Urine culture     Status: None (Preliminary result)   Collection Time: 10/08/15  5:32 PM  Result Value Ref Range Status   Specimen Description URINE, CATHETERIZED  Final   Special Requests Normal  Final   Culture   Final    NO GROWTH < 24 HOURS Performed at [facility]    Report Status PENDING  Incomplete     Studies: Dg Chest 2 View  10/08/2015  CLINICAL DATA:  Pulmonary abscess. EXAM: CHEST  2 VIEW COMPARISON:  CT scan of October 03, 2015. Radiograph of October 01, 2015. FINDINGS: The heart size and mediastinal contours are within normal limits. No pneumothorax or pleural effusion is noted. Lingular subsegmental atelectasis or scarring is noted. Rounded abnormality is seen in the right lower lobe consistent with probable pulmonary abscess noted on prior study. It appears to have nearly completely filled with fluid at this time. The visualized skeletal structures are unremarkable. IMPRESSION: Continued presence of rounded abnormality seen in right lower lobe most consistent with pulmonary abscess, as it has filled in with fluid completely since prior exam. Neoplasm or malignancy cannot be excluded. Electronically Signed   By: Lupita RaiderJames  Green Jr, M.D.   On: 10/08/2015 16:06   Dg Esophagus  10/09/2015  CLINICAL DATA:  Nausea vomiting. Evaluate for recurrence of hiatal hernia after anti reflux surgery. EXAM: ESOPHOGRAM/BARIUM SWALLOW TECHNIQUE: Single contrast examination was performed using  thin barium. FLUOROSCOPY TIME:  Radiation Exposure Index (as provided by  the fluoroscopic device): 5.1 mg Y Number of Acquired Images:  None COMPARISON:  CTs of the chest, abdomen, and pelvis dated 10/03/2015. FINDINGS: Secondary to the patient's clinical status, and the clinical question, exam was performed in an LPO position with focused single-contrast technique. Evaluation of individual swallows demonstrates mild esophageal dysmotility. Full column evaluation of the esophagus demonstrates no persistent narrowing or stricture. No hiatal hernia. Normal appearance of the upper/proximal stomach. IMPRESSION: 1. No evidence of hiatal hernia or other focal esophageal abnormality. 2. Mild esophageal dysmotility, likely presbyesophagus. Electronically Signed   By: Jeronimo GreavesKyle  Talbot M.D.   On: 10/09/2015 12:59    Scheduled Meds: . aspirin  81 mg Oral Daily  . atorvastatin  40 mg Oral Daily  . clopidogrel  75 mg Oral Daily  . donepezil  10 mg Oral QHS  . insulin aspart  0-5 Units Subcutaneous QHS  . insulin aspart  0-9 Units Subcutaneous TID WC  . memantine  28 mg Oral Daily  . multivitamin with minerals  1 tablet Oral Daily  . piperacillin-tazobactam (ZOSYN)  IV  3.375 g Intravenous Q8H  . potassium chloride  40 mEq Oral Q4H  . sertraline  100 mg Oral Daily  Continuous Infusions: . sodium chloride 0.45 % with kcl 150 mL/hr at 10/09/15 1026    Principal Problem:   Lung abscess (HCC) Active Problems:   Moderate dementia   Dysphagia   Anorexia   Nausea and vomiting   DM2 (diabetes mellitus, type 2) (HCC)   Volume depletion   AKI (acute kidney injury) (HCC)   Hypertension   History of recent stroke   Left hemiparesis (HCC)   Protein-calorie malnutrition, severe    Time spent: 2 MINS    [facility] MD [facility] Pager [phone]. If 7PM-7AM, please contact night-coverage at [email or web address], password [facility] 10/09/2015, 2:38 PM  LOS: 1 day

## 2015-10-09 NOTE — Progress Notes (Signed)
Pharmacy Antibiotic Note  Lisa Shaffer is a 70 y.o. female admitted on 10/08/2015 with pulmonary abcess.  Pharmacy has been consulted for Zosyn dosing.  Pt is a 70 year old female presents with dysphagia with emesis, weight loss, and worsening kidney function.  She has progressive dysphagia over the past 2 weeks. She has not been able to take her prescribed medicine. They report associated dry cough. She presented to her PCP who did a chest CT and CT of Abdomen and pelvis on 3/31. The CT of abdomen was unremarkable however Ct of chest showed a gas and fluid containing mass within the right lower lobe, possibly a pulmonary abscess. Also showed a subpleural consolidation within the left and right lower lobes. She was prescribed Augmentin however has been unable to take due to dysphagia.     Plan:  Zosyn 3.375g IV q8h (4 hour infusion).   Monitor renal function as current CrCl is 25 ml/min   Height: 5' (152.4 cm) Weight: 113 lb (51.256 kg) IBW/kg (Calculated) : 45.5  Temp (24hrs), Avg:98.2 F (36.8 C), Min:97.9 F (36.6 C), Max:98.5 F (36.9 C)   Recent Labs Lab 10/07/15 1033 10/08/15 1454 10/08/15 1627 10/09/15 0507  WBC  --  11.4*  --  12.0*  CREATININE 2.76* 2.11*  --  1.49*  LATICACIDVEN  --   --  1.18  --     Estimated Creatinine Clearance: 25.2 mL/min (by C-G formula based on Cr of 1.49).    No Known Allergies  Antimicrobials this admission: Augmentin PTA, unclear how much she took  4/5 Zosyn >>    Dose adjustments this admission: ---  Microbiology results: 4/4 UCx: NGTD  Thank you for allowing pharmacy to be a part of this patient's care.   Adalberto ColeNikola Kymberlyn Eckford, PharmD, BCPS Pager [phone] 10/09/2015 12:45 PM

## 2015-10-09 NOTE — Telephone Encounter (Signed)
Spoke with son and he will drop of information about jury duty so a letter can be given.

## 2015-10-10 DIAGNOSIS — F0390 Unspecified dementia without behavioral disturbance: Secondary | ICD-10-CM

## 2015-10-10 DIAGNOSIS — Z8673 Personal history of transient ischemic attack (TIA), and cerebral infarction without residual deficits: Secondary | ICD-10-CM

## 2015-10-10 DIAGNOSIS — E43 Unspecified severe protein-calorie malnutrition: Secondary | ICD-10-CM

## 2015-10-10 LAB — CBC
HCT: 26.7 % — ABNORMAL LOW (ref 36.0–46.0)
HEMOGLOBIN: 9.5 g/dL — AB (ref 12.0–15.0)
MCH: 26 pg (ref 26.0–34.0)
MCHC: 35.6 g/dL (ref 30.0–36.0)
MCV: 73.2 fL — ABNORMAL LOW (ref 78.0–100.0)
PLATELETS: 331 10*3/uL (ref 150–400)
RBC: 3.65 MIL/uL — AB (ref 3.87–5.11)
RDW: 15.3 % (ref 11.5–15.5)
WBC: 14.9 10*3/uL — AB (ref 4.0–10.5)

## 2015-10-10 LAB — BASIC METABOLIC PANEL
Anion gap: 12 (ref 5–15)
BUN: 19 mg/dL (ref 6–20)
CALCIUM: 9.1 mg/dL (ref 8.9–10.3)
CO2: 19 mmol/L — ABNORMAL LOW (ref 22–32)
CREATININE: 1.35 mg/dL — AB (ref 0.44–1.00)
Chloride: 107 mmol/L (ref 101–111)
GFR calc Af Amer: 45 mL/min — ABNORMAL LOW (ref >60)
GFR, EST NON AFRICAN AMERICAN: 39 mL/min — AB (ref >60)
GLUCOSE: 145 mg/dL — AB (ref 65–99)
Potassium: 5 mmol/L (ref 3.5–5.1)
SODIUM: 138 mmol/L (ref 135–145)

## 2015-10-10 LAB — MAGNESIUM: Magnesium: 1.5 mg/dL — ABNORMAL LOW (ref 1.7–2.4)

## 2015-10-10 LAB — RETICULOCYTES
RBC.: 3.65 MIL/uL — AB (ref 3.87–5.11)
RETIC CT PCT: 1.1 % (ref 0.4–3.1)
Retic Count, Absolute: 40.2 10*3/uL (ref 19.0–186.0)

## 2015-10-10 LAB — FERRITIN: Ferritin: 981 ng/mL — ABNORMAL HIGH (ref 11–307)

## 2015-10-10 LAB — IRON AND TIBC
Iron: 17 ug/dL — ABNORMAL LOW (ref 28–170)
SATURATION RATIOS: 9 % — AB (ref 10.4–31.8)
TIBC: 186 ug/dL — AB (ref 250–450)
UIBC: 169 ug/dL

## 2015-10-10 LAB — VITAMIN B12: VITAMIN B 12: 562 pg/mL (ref 180–914)

## 2015-10-10 LAB — FOLATE: FOLATE: 10.5 ng/mL (ref >5.9)

## 2015-10-10 LAB — GLUCOSE, CAPILLARY
GLUCOSE-CAPILLARY: 133 mg/dL — AB (ref 65–99)
GLUCOSE-CAPILLARY: 122 mg/dL — AB (ref 65–99)
GLUCOSE-CAPILLARY: 190 mg/dL — AB (ref 65–99)
Glucose-Capillary: 122 mg/dL — ABNORMAL HIGH (ref 65–99)

## 2015-10-10 MED ORDER — MAGNESIUM SULFATE 4 GM/100ML IV SOLN
4.0000 g | Freq: Once | INTRAVENOUS | Status: AC
  Administered 2015-10-10: 4 g via INTRAVENOUS
  Filled 2015-10-10: qty 100

## 2015-10-10 MED ORDER — NIFEDIPINE ER OSMOTIC RELEASE 30 MG PO TB24
90.0000 mg | ORAL_TABLET | Freq: Every day | ORAL | Status: DC
  Administered 2015-10-10 – 2015-10-15 (×6): 90 mg via ORAL
  Filled 2015-10-10 (×6): qty 3

## 2015-10-10 MED ORDER — METOCLOPRAMIDE HCL 5 MG/ML IJ SOLN
5.0000 mg | Freq: Four times a day (QID) | INTRAMUSCULAR | Status: AC
  Administered 2015-10-10 (×2): 5 mg via INTRAVENOUS
  Filled 2015-10-10 (×2): qty 2

## 2015-10-10 MED ORDER — NIFEDIPINE ER OSMOTIC RELEASE 90 MG PO TB24
90.0000 mg | ORAL_TABLET | Freq: Every day | ORAL | Status: DC
  Filled 2015-10-10: qty 1

## 2015-10-10 MED ORDER — SODIUM CHLORIDE 0.9 % IV SOLN: INTRAVENOUS | Status: DC

## 2015-10-10 MED ORDER — SODIUM CHLORIDE 0.9 % IV SOLN
INTRAVENOUS | Status: DC
  Administered 2015-10-10: 09:00:00 via INTRAVENOUS

## 2015-10-10 NOTE — Care Management Note (Addendum)
Case Management Note  Patient Details  Name: Fabio AsaMarsha P Zeringue MRN: [id number] Date of Birth: 01/28/1946  Subjective/Objective:  70 y.o. F admitted 10/08/2015 after 2 week hx difficulty swallowing, Intermittent Vomiting after eating or spitting out food,  And wt loss. Pt lives at home with her spouse and has HHPT with [facility]. 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 Molly MaduroRobert will bring a copy of a bill from the home as he does not know the name of the agency.                    Action/Plan: Anticipate discharge home after follow up with GI, Pulmonary,  Speech Therapy and Medical clearance from Hospitalist. CM will continue to follow.    Expected Discharge Date:   (unknown)               Expected Discharge Plan:     In-House Referral:     Discharge planning Services  CM Consult  Post Acute Care Choice:    Choice offered to:  Patient, Spouse  DME Arranged:    DME Agency:     HH Arranged:    HH Agency:     Status of Service:  In process, will continue to follow  Medicare Important Message Given:    Date Medicare IM Given:    Medicare IM give by:    Date Additional Medicare IM Given:    Additional Medicare Important Message give by:     If discussed at Long Length of Stay Meetings, dates discussed:    Additional Comments: [facility]. Has completed HHOT. LM woth Zerita BoersMary Yonjoff, [facility] @ [phone].   Yvone NeuCrutchfield, Rafel Garde M, RN 10/10/2015, 10:51 AM
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
## 2015-10-10 NOTE — Progress Notes (Signed)
[facility] PROGRESS NOTE  Lisa Shaffer ZOX:[id number] DOB: 07/16/1945 DOA: 10/08/2015 PCP: [name], DO  Assessment/Plan: #1 lung abscess Per chest x-ray and per CT chest. Patient currently afebrile. Patient noted to have a leukocytosis. Patient denies any shortness of breath. Patient has been seen in consultation by pulmonary were recommending 4-6 weeks of antibiotics of Augmentin twice daily with repeat CT imaging at that time and further antibiotics guided by CT findings. Patient is to follow-up with pulmonary in outpatient setting. Due to recurrent dysphagia issues will continue IV Zosyn for now until dysphagia has been fully evaluated and patient able to tolerate oral intake.  #2 dysphagia Questionable etiology. Patient has been seen by gastroenterology patient underwent a barium swallow which is showing no evidence of facial or hernia or other focal esophageal abnormality. Mild esophageal dysmotility, likely presbyesophagus. In discussion with patient's son was noted that after meals which patient had initially tolerated while watching television patient was noted to have nausea and emesis. Patient is also noted to be a diabetic. Consent for possible diabetic gastroparesis. Will place patient on Reglan 5 mg IV every 6 hours for now. Will get a gastric emptying study. Speech therapy following. If gastric emptying study is unremarkable then patient may benefit from a modified barium swallow. GI following and appreciate input and recommendations.  #3 hypokalemia Repleted.  #4 anemia Likely chronic in nature and dilutional. Anemia panel with iron level of 17 TIBC of 186 ferritin of 981. Folate of 10.5. Likely anemia of chronic disease. Follow H&H.  #5 leukocytosis Likely secondary to problem #1. Urinalysis is unremarkable. Continue IV Zosyn.  #6 acute renal failure Likely secondary to volume depletion and possibly contrast induced in the setting of ARB. ARB on hold. Patient on IV  fluids. Patient with good urine output per nursing. Renal function trending down. Decrease IV fluids to 75 mL per hour. Follow.  #7 history of recent pontine CVA/left hemiparesis generated 2017 Continue aspirin/Plavix for secondary probe stroke prevention.  #8 dementia Stable. Continue Namenda.  #9 diabetes mellitus Oral hypoglycemic agents on hold. Sliding scale insulin.  #10 depression Continue Zoloft.  #11 hypertension ARB and diuretics on hold. BP stable. Follow.  #12 protein calorie malnuitrition  #13 prophylaxis SCDs for DVT prophylaxis.  Code Status: Full Family Communication: Updated patient and son at bedside. Disposition Plan: Home once medically stable and dysphagia evaluated and patient tolerating a diet.   Consultants:  Pulmonary: [name] 10/08/2015  Gastroenterology: Dr. Madilyn Fireman 10/09/2015  Procedures:  CT chest/ abdomen/pelvis 10/03/2015  Chest x-ray 10/08/2015  Barium swallow 10/09/2015   Antibiotics:  IV Zosyn 10/09/2015  HPI/Subjective: Patient tolerated breakfast this morning per nursing. A few hours later patient noted to be nauseous with emesis and dry heaves. At time of my evaluation patient denies any shortness of breath no chest pain. No nausea no vomiting. Patient states she's feeling better.  Objective: Filed Vitals:   10/10/15 0446 10/10/15 0900  BP: 106/64 132/84  Pulse: 92 122  Temp: 98.3 F (36.8 C) 98.4 F (36.9 C)  Resp: 18 28    Intake/Output Summary (Last 24 hours) at 10/10/15 1419 Last data filed at 10/10/15 0700  Gross per 24 hour  Intake   3525 ml  Output      0 ml  Net   3525 ml   Filed Weights   10/08/15 1425  Weight: 51.256 kg (113 lb)    Exam:   General:  NAD  Cardiovascular: RRR  Respiratory: Some coarse BS  Abdomen: Soft, nontender, nondistended, positive bowel sounds.  Musculoskeletal: No clubbing cyanosis or edema.  Data Reviewed: Basic Metabolic Panel:  Recent Labs Lab 10/07/15 1033  10/08/15 1454 10/09/15 0507 10/10/15 0520  NA 133* 138 138 138  K 3.2* 3.1* 3.0* 5.0  CL 92* 99* 104 107  CO2 21 21* 21* 19*  GLUCOSE 136* 174* 105* 145*  BUN 52* 43* 30* 19  CREATININE 2.76* 2.11* 1.49* 1.35*  CALCIUM 9.4 9.6 8.7* 9.1  MG  --   --  1.8 1.5*   Liver Function Tests:  Recent Labs Lab 10/08/15 1454 10/09/15 0507  AST 14* 10*  ALT 11* 8*  ALKPHOS 93 77  BILITOT 1.1 0.7  PROT 7.9 6.5  ALBUMIN 2.5* 2.0*   No results for input(s): LIPASE, AMYLASE in the last 168 hours. No results for input(s): AMMONIA in the last 168 hours. CBC:  Recent Labs Lab 10/08/15 1454 10/09/15 0507 10/10/15 0520  WBC 11.4* 12.0* 14.9*  NEUTROABS 9.5*  --   --   HGB 10.6* 8.7* 9.5*  HCT 30.6* 24.7* 26.7*  MCV 74.6* 73.1* 73.2*  PLT 356 297 331   Cardiac Enzymes: No results for input(s): CKTOTAL, CKMB, CKMBINDEX, TROPONINI in the last 168 hours. BNP (last 3 results) No results for input(s): BNP in the last 8760 hours.  ProBNP (last 3 results) No results for input(s): PROBNP in the last 8760 hours.  CBG:  Recent Labs Lab 10/09/15 1618 10/09/15 2139 10/10/15 0728 10/10/15 0856 10/10/15 1128  GLUCAP 139* 121* 122* 133* 122*    Recent Results (from the past 240 hour(s))  Urine culture     Status: None   Collection Time: 10/08/15  5:32 PM  Result Value Ref Range Status   Specimen Description URINE, CATHETERIZED  Final   Special Requests Normal  Final   Culture   Final    NO GROWTH 1 DAY Performed at [facility]    Report Status 10/09/2015 FINAL  Final     Studies: Dg Chest 2 View  10/08/2015  CLINICAL DATA:  Pulmonary abscess. EXAM: CHEST  2 VIEW COMPARISON:  CT scan of October 03, 2015. Radiograph of October 01, 2015. FINDINGS: The heart size and mediastinal contours are within normal limits. No pneumothorax or pleural effusion is noted. Lingular subsegmental atelectasis or scarring is noted. Rounded abnormality is seen in the right lower lobe consistent  with probable pulmonary abscess noted on prior study. It appears to have nearly completely filled with fluid at this time. The visualized skeletal structures are unremarkable. IMPRESSION: Continued presence of rounded abnormality seen in right lower lobe most consistent with pulmonary abscess, as it has filled in with fluid completely since prior exam. Neoplasm or malignancy cannot be excluded. Electronically Signed   By: Lupita Raider, M.D.   On: 10/08/2015 16:06   Dg Esophagus  10/09/2015  CLINICAL DATA:  Nausea vomiting. Evaluate for recurrence of hiatal hernia after anti reflux surgery. EXAM: ESOPHOGRAM/BARIUM SWALLOW TECHNIQUE: Single contrast examination was performed using  thin barium. FLUOROSCOPY TIME:  Radiation Exposure Index (as provided by the fluoroscopic device): 5.1 mg Y Number of Acquired Images:  None COMPARISON:  CTs of the chest, abdomen, and pelvis dated 10/03/2015. FINDINGS: Secondary to the patient's clinical status, and the clinical question, exam was performed in an LPO position with focused single-contrast technique. Evaluation of individual swallows demonstrates mild esophageal dysmotility. Full column evaluation of the esophagus demonstrates no persistent narrowing or stricture. No hiatal hernia. Normal appearance of  the upper/proximal stomach. IMPRESSION: 1. No evidence of hiatal hernia or other focal esophageal abnormality. 2. Mild esophageal dysmotility, likely presbyesophagus. Electronically Signed   By: Jeronimo GreavesKyle  Talbot M.D.   On: 10/09/2015 12:59    Scheduled Meds: . aspirin  81 mg Oral Daily  . atorvastatin  40 mg Oral Daily  . clopidogrel  75 mg Oral Daily  . donepezil  10 mg Oral QHS  . insulin aspart  0-5 Units Subcutaneous QHS  . insulin aspart  0-9 Units Subcutaneous TID WC  . memantine  28 mg Oral Daily  . multivitamin with minerals  1 tablet Oral Daily  . NIFEdipine  90 mg Oral Daily  . piperacillin-tazobactam (ZOSYN)  IV  3.375 g Intravenous Q8H  . sertraline   100 mg Oral Daily   Continuous Infusions: . sodium chloride 75 mL/hr at 10/10/15 1403    Principal Problem:   Lung abscess [facility](HCC) Active Problems:   Moderate dementia   Dysphagia   Anorexia   Nausea and vomiting   DM2 (diabetes mellitus, type 2) (HCC)   Volume depletion   AKI (acute kidney injury) (HCC)   Hypertension   History of recent stroke   Left hemiparesis (HCC)   Protein-calorie malnutrition, severe    Time spent: 1235 MINS    [facility] Hospital[facility]OMPSON,Rennie Rouch MD [facility] Pager [phone]. If 7PM-7AM, please contact night-coverage at [email or web address], password [email or web address] 10/10/2015, 2:19 PM  LOS: 2 days

## 2015-10-10 NOTE — Evaluation (Signed)
Occupational Therapy Evaluation Patient Details Name: Lisa Shaffer MRN: [id number] DOB: Dec 29, 1945 Today's Date: 10/10/2015    History of Present Illness 70 yo female admitted with lung abscess. Hx of CVA, HTN, DM, dementia.    Clinical Impression   Pt was admitted for the above. She will benefit from skilled OT to increase safety and independence with toilet transfers and mobility related to ADLs with goals of min A +1.  Pt currently needs +2 assistance for safety    Follow Up Recommendations  SNF;Supervision/Assistance - 24 hour;Home health OT    Equipment Recommendations  None recommended by OT    Recommendations for Other Services       Precautions / Restrictions Precautions Precautions: Fall Precaution Comments: L hemiparesis Restrictions Weight Bearing Restrictions: No      Mobility Bed Mobility Overal bed mobility: Needs Assistance Bed Mobility: Supine to Sit     Supine to sit: HOB elevated;Mod assist     General bed mobility comments: Assist for trunk and L LE.   Transfers Overall transfer level: Needs assistance   Transfers: Sit to/from Stand;Stand Pivot Transfers Sit to Stand: Mod assist;+2 physical assistance;+2 safety/equipment Stand pivot transfers: Mod assist;+2 physical assistance;+2 safety/equipment       General transfer comment: Assist to rise, stabilize, weightshift, block L knee, control descent. STand pivot, towards R side, bed>recliner.     Balance Overall balance assessment: Needs assistance Sitting-balance support: Feet supported Sitting balance-Leahy Scale: Fair to poor Sitting balance - Comments: very close guarding and min A for part of the time Postural control: Posterior lean   Standing balance-Leahy Scale: Zero                              ADL Overall ADL's : Needs assistance/impaired     Grooming: Set up;Sitting   Upper Body Bathing: Minimal assitance;Sitting   Lower Body Bathing: Maximal assistance;Sit  to/from stand;+2 for physical assistance   Upper Body Dressing : Minimal assistance;Sitting   Lower Body Dressing: Total assistance;+2 for physical assistance;Sit to/from stand   Toilet Transfer: Moderate assistance;+2 for physical assistance;Stand-pivot (chair)   Toileting- Clothing Manipulation and Hygiene: Maximal assistance;+2 for physical assistance;Sit to/from stand         General ADL Comments: pt required assistance to maintain static sitting.  She began coughing and vomited liquid:  RN aware.  Pt's LLE buckles during transfers     Vision Additional Comments: not tested   Perception     Praxis      Pertinent Vitals/Pain Pain Assessment: Faces Faces Pain Scale: No hurt Pain Intervention(s): Monitored during session;Repositioned     Hand Dominance Right   Extremity/Trunk Assessment Upper Extremity Assessment Upper Extremity Assessment:  LUE Deficits / Details: flaccid; fingers are stiff; extended positioning      Cervical / Trunk Assessment Cervical / Trunk Assessment: Normal   Communication Communication Communication:  (very soft spoken; little verbalization)   Cognition Arousal/Alertness: Awake/alert Behavior During Therapy: WFL for tasks assessed/performed Overall Cognitive Status: Difficult to assess                     General Comments       Exercises       Shoulder Instructions      Home Living Family/patient expects to be discharged to:: Private residence Living Arrangements: Spouse/significant other;Children  Prior Functioning/Environment Level of Independence: Needs assistance             OT Diagnosis: Generalized weakness   OT Problem List: Decreased strength;Decreased activity tolerance;Impaired balance (sitting and/or standing);Impaired UE functional use;Impaired tone   OT Treatment/Interventions: Self-care/ADL training;DME and/or AE instruction;Patient/family  education;Balance training;Therapeutic activities    OT Goals(Current goals can be found in the care plan section) Acute Rehab OT Goals Patient Stated Goal: none stated OT Goal Formulation: With patient Time For Goal Achievement: 10/24/15 Potential to Achieve Goals: Good ADL Goals Pt Will Transfer to Toilet: with min assist;bedside commode;stand pivot transfer Additional ADL Goal #1: pt will go from sit to stand with min A and maintain for 2 minutes with min A for adls using AD for RUE for support Additional ADL Goal #2: pt will perform bed mobility at min A level in preparation for ADLs/toilet transfers  OT Frequency: Min 2X/week   Barriers to D/C:            Co-evaluation              End of Session Nurse Communication: Mobility status (transfer to strong side +2 or maximove)  Activity Tolerance:  (coughing) Patient left: in chair;with call bell/phone within reach;with bed alarm set   Time: 7829-56210957-1016 OT Time Calculation (min): 19 min Charges:  OT General Charges $OT Visit: 1 Procedure OT Evaluation $OT Eval Moderate Complexity: 1 Procedure G-Codes:    Stephene Alegria 10/10/2015, 10:31 AM  Marica OtterMaryellen Riti Rollyson, OTR/L [phone] 10/10/2015

## 2015-10-10 NOTE — Evaluation (Signed)
Physical Therapy Evaluation Patient Details Name: Lisa Shaffer MRN: [id number] DOB: 07/16/1945 Today's Date: 10/10/2015   History of Present Illness  70 yo female admitted with lung abscess. Hx of CVA, HTN, DM, dementia.   Clinical Impression  On eval, pt required Mod assist +2 for mobility-performed stand pivot from bed to recliner. L LE buckling noted. No functional use of L UE. Family not present during eval. [facility]     Follow Up Recommendations Home health PT;Supervision/Assistance - 24 hour vs SNF (depending on progress and assist available at home. No family present during eval. )    Equipment Recommendations  None recommended by PT    Recommendations for Other Services       Precautions / Restrictions Precautions Precautions: Fall Precaution Comments: L hemiparesis Restrictions Weight Bearing Restrictions: No      Mobility  Bed Mobility Overal bed mobility: Needs Assistance Bed Mobility: Supine to Sit     Supine to sit: HOB elevated;Mod assist     General bed mobility comments: Assist for trunk and L LE.   Transfers Overall transfer level: Needs assistance   Transfers: Sit to/from Stand;Stand Pivot Transfers Sit to Stand: Mod assist;+2 physical assistance;+2 safety/equipment Stand pivot transfers: Mod assist;+2 physical assistance;+2 safety/equipment       General transfer comment: Assist to rise, stabilize, weightshift, block L knee, control descent. STand pivot, towards R side, bed>recliner.   Ambulation/Gait             General Gait Details: NT  Stairs            Wheelchair Mobility    Modified Rankin (Stroke Patients Only)       Balance Overall balance assessment: Needs assistance Sitting-balance support: Feet supported Sitting balance-Leahy Scale: Fair Sitting balance - Comments: very close guarding.  Postural control: Posterior lean   Standing balance-Leahy Scale: Zero                              Pertinent Vitals/Pain Pain Assessment: Faces Faces Pain Scale: No hurt Pain Intervention(s): Monitored during session;Repositioned    Home Living                        Prior Function                 Hand Dominance        Extremity/Trunk Assessment   Upper Extremity Assessment: Defer to OT evaluation           Lower Extremity Assessment: LLE deficits/detail   LLE Deficits / Details: ~2/5 throughout. Knee buckles in standing  Cervical / Trunk Assessment: Normal  Communication      Cognition                            General Comments      Exercises        Assessment/Plan    PT Assessment Patient needs continued PT services  PT Diagnosis Generalized weakness (L hemiparesis)   PT Problem List Decreased strength;Decreased range of motion;Decreased activity tolerance;Decreased balance;Decreased mobility;Decreased knowledge of use of DME;Decreased cognition  PT Treatment Interventions DME instruction;Functional mobility training;Therapeutic activities;Patient/family education;Balance training;Therapeutic exercise   PT Goals (Current goals can be found in the Care Plan section) Acute Rehab PT Goals Patient Stated Goal: none stated PT Goal Formulation: With patient Time For Goal Achievement: 10/24/15  Potential to Achieve Goals: Good    Frequency Min 3X/week   Barriers to discharge        Co-evaluation               End of Session Equipment Utilized During Treatment: Gait belt Activity Tolerance: Patient tolerated treatment well Patient left: in chair;with call bell/phone within reach;with chair alarm set           Time: 4098-1191 PT Time Calculation (min) (ACUTE ONLY): 24 min   Charges:   PT Evaluation $PT Eval Low Complexity: 1 Procedure     PT G Codes:        Rebeca Alert, MPT Pager: [phone]

## 2015-10-11 ENCOUNTER — Inpatient Hospital Stay (HOSPITAL_COMMUNITY): Payer: Medicare Other

## 2015-10-11 DIAGNOSIS — R112 Nausea with vomiting, unspecified: Secondary | ICD-10-CM

## 2015-10-11 LAB — BASIC METABOLIC PANEL
ANION GAP: 11 (ref 5–15)
BUN: 14 mg/dL (ref 6–20)
CALCIUM: 8.8 mg/dL — AB (ref 8.9–10.3)
CO2: 20 mmol/L — AB (ref 22–32)
Chloride: 113 mmol/L — ABNORMAL HIGH (ref 101–111)
Creatinine, Ser: 1.26 mg/dL — ABNORMAL HIGH (ref 0.44–1.00)
GFR calc Af Amer: 49 mL/min — ABNORMAL LOW (ref >60)
GFR calc non Af Amer: 42 mL/min — ABNORMAL LOW (ref >60)
GLUCOSE: 111 mg/dL — AB (ref 65–99)
Potassium: 3.9 mmol/L (ref 3.5–5.1)
Sodium: 144 mmol/L (ref 135–145)

## 2015-10-11 LAB — CBC
HEMATOCRIT: 25.2 % — AB (ref 36.0–46.0)
Hemoglobin: 8.8 g/dL — ABNORMAL LOW (ref 12.0–15.0)
MCH: 26.3 pg (ref 26.0–34.0)
MCHC: 34.9 g/dL (ref 30.0–36.0)
MCV: 75.2 fL — AB (ref 78.0–100.0)
Platelets: 318 10*3/uL (ref 150–400)
RBC: 3.35 MIL/uL — ABNORMAL LOW (ref 3.87–5.11)
RDW: 15.9 % — AB (ref 11.5–15.5)
WBC: 12.1 10*3/uL — AB (ref 4.0–10.5)

## 2015-10-11 LAB — GLUCOSE, CAPILLARY
GLUCOSE-CAPILLARY: 96 mg/dL (ref 65–99)
GLUCOSE-CAPILLARY: 93 mg/dL (ref 65–99)
GLUCOSE-CAPILLARY: 109 mg/dL — AB (ref 65–99)
Glucose-Capillary: 103 mg/dL — ABNORMAL HIGH (ref 65–99)

## 2015-10-11 LAB — MAGNESIUM: Magnesium: 2.6 mg/dL — ABNORMAL HIGH (ref 1.7–2.4)

## 2015-10-11 MED ORDER — METOCLOPRAMIDE HCL 5 MG/ML IJ SOLN
5.0000 mg | Freq: Four times a day (QID) | INTRAMUSCULAR | Status: DC
  Administered 2015-10-11 – 2015-10-13 (×9): 5 mg via INTRAVENOUS
  Filled 2015-10-11: qty 1
  Filled 2015-10-11: qty 2
  Filled 2015-10-11 (×2): qty 1
  Filled 2015-10-11: qty 2
  Filled 2015-10-11 (×3): qty 1
  Filled 2015-10-11: qty 2
  Filled 2015-10-11 (×2): qty 1
  Filled 2015-10-11: qty 2

## 2015-10-11 NOTE — Progress Notes (Signed)
[facility] PROGRESS NOTE  WHISPER KURKA RUE:[id number] DOB: 1945/09/17 DOA: 10/08/2015 PCP: [name], DO  Assessment/Plan: #1 lung abscess Per chest x-ray and per CT chest. Patient currently afebrile. Patient noted to have a leukocytosis. Patient denies any shortness of breath. Patient has been seen in consultation by pulmonary were recommending 4-6 weeks of antibiotics of Augmentin twice daily with repeat CT imaging at that time and further antibiotics guided by CT findings. Patient is to follow-up with pulmonary in outpatient setting. Due to recurrent dysphagia issues will continue IV Zosyn for now until dysphagia has been fully evaluated and patient able to tolerate oral intake.  #2 dysphagia Questionable etiology. Patient has been seen by gastroenterology patient underwent a barium swallow which is showing no evidence of facial or hernia or other focal esophageal abnormality. Mild esophageal dysmotility, likely presbyesophagus. In discussion with patient's son was noted that after meals which patient had initially tolerated while watching television patient was noted to have nausea and emesis. Patient is also noted to be a diabetic. Concern for possible diabetic gastroparesis. Patient unable to undergo gastric emptying study. Continue Reglan 5 mg IV every 6 hours for now. Speech therapy following.Continue dysphagia 3 diet.  GI following and appreciate input and recommendations.  #3 hypokalemia Repleted.  #4 anemia Likely chronic in nature and dilutional. Anemia panel with iron level of 17 TIBC of 186 ferritin of 981. Folate of 10.5. Likely anemia of chronic disease. Follow H&H.  #5 leukocytosis Likely secondary to problem #1. Urinalysis is unremarkable. Continue IV Zosyn.  #6 acute renal failure Likely secondary to volume depletion and possibly contrast induced in the setting of ARB. ARB on hold. Patient on IV fluids. Patient with good urine output per nursing. Renal function  trending down. Continue IV fluids at 75 mL per hour.  #7 history of recent pontine CVA/left hemiparesis generated 2017 Continue aspirin/Plavix for secondary probe stroke prevention.  #8 dementia Stable. Continue Namenda.  #9 diabetes mellitus Oral hypoglycemic agents on hold. Sliding scale insulin.  #10 depression Continue Zoloft.  #11 hypertension ARB and diuretics on hold. BP stable. Follow.  #12 protein calorie malnuitrition  #13 prophylaxis SCDs for DVT prophylaxis.  Code Status: Full Family Communication: Updated patient. No family at bedside. Disposition Plan: Home once medically stable and dysphagia evaluated and patient tolerating a diet.   Consultants:  Pulmonary: [name] 10/08/2015  Gastroenterology: Dr. Madilyn Fireman 10/09/2015  Procedures:  CT chest/ abdomen/pelvis 10/03/2015  Chest x-ray 10/08/2015  Barium swallow 10/09/2015   Antibiotics:  IV Zosyn 10/09/2015  HPI/Subjective: Patient unable to undergo gastric emptying study as patient continued to chew the egg white and not swallow and as such study was canceled.  Objective: Filed Vitals:   10/11/15 1403 10/11/15 1522  BP: 90/57 122/70  Pulse: 87   Temp: 97.8 F (36.6 C)   Resp: 20     Intake/Output Summary (Last 24 hours) at 10/11/15 1605 Last data filed at 10/11/15 0600  Gross per 24 hour  Intake 1352.5 ml  Output      0 ml  Net 1352.5 ml   Filed Weights   10/08/15 1425  Weight: 51.256 kg (113 lb)    Exam:   General:  NAD  Cardiovascular: RRR  Respiratory: Some coarse BS  Abdomen: Soft, nontender, nondistended, positive bowel sounds.  Musculoskeletal: No clubbing cyanosis or edema.  Data Reviewed: Basic Metabolic Panel:  Recent Labs Lab 10/07/15 1033 10/08/15 1454 10/09/15 0507 10/10/15 0520 10/11/15 0516 10/11/15 0519  NA 133* 138  138 138  --  144  K 3.2* 3.1* 3.0* 5.0  --  3.9  CL 92* 99* 104 107  --  113*  CO2 21 21* 21* 19*  --  20*  GLUCOSE 136* 174* 105*  145*  --  111*  BUN 52* 43* 30* 19  --  14  CREATININE 2.76* 2.11* 1.49* 1.35*  --  1.26*  CALCIUM 9.4 9.6 8.7* 9.1  --  8.8*  MG  --   --  1.8 1.5* 2.6*  --    Liver Function Tests:  Recent Labs Lab 10/08/15 1454 10/09/15 0507  AST 14* 10*  ALT 11* 8*  ALKPHOS 93 77  BILITOT 1.1 0.7  PROT 7.9 6.5  ALBUMIN 2.5* 2.0*   No results for input(s): LIPASE, AMYLASE in the last 168 hours. No results for input(s): AMMONIA in the last 168 hours. CBC:  Recent Labs Lab 10/08/15 1454 10/09/15 0507 10/10/15 0520 10/11/15 0519  WBC 11.4* 12.0* 14.9* 12.1*  NEUTROABS 9.5*  --   --   --   HGB 10.6* 8.7* 9.5* 8.8*  HCT 30.6* 24.7* 26.7* 25.2*  MCV 74.6* 73.1* 73.2* 75.2*  PLT 356 297 331 318   Cardiac Enzymes: No results for input(s): CKTOTAL, CKMB, CKMBINDEX, TROPONINI in the last 168 hours. BNP (last 3 results) No results for input(s): BNP in the last 8760 hours.  ProBNP (last 3 results) No results for input(s): PROBNP in the last 8760 hours.  CBG:  Recent Labs Lab 10/10/15 0856 10/10/15 1128 10/10/15 1716 10/11/15 0727 10/11/15 1112  GLUCAP 133* 122* 190* 103* 96    Recent Results (from the past 240 hour(s))  Urine culture     Status: None   Collection Time: 10/08/15  5:32 PM  Result Value Ref Range Status   Specimen Description URINE, CATHETERIZED  Final   Special Requests Normal  Final   Culture   Final    NO GROWTH 1 DAY [facility]    Report Status 10/09/2015 FINAL  Final     Studies: No results found.  Scheduled Meds: . aspirin  81 mg Oral Daily  . atorvastatin  40 mg Oral Daily  . clopidogrel  75 mg Oral Daily  . donepezil  10 mg Oral QHS  . insulin aspart  0-5 Units Subcutaneous QHS  . insulin aspart  0-9 Units Subcutaneous TID WC  . memantine  28 mg Oral Daily  . metoCLOPramide (REGLAN) injection  5 mg Intravenous 4 times per day  . multivitamin with minerals  1 tablet Oral Daily  . NIFEdipine  90 mg Oral Daily  .  piperacillin-tazobactam (ZOSYN)  IV  3.375 g Intravenous Q8H  . sertraline  100 mg Oral Daily   Continuous Infusions: . sodium chloride 75 mL/hr at 10/10/15 1403    Principal Problem:   Lung abscess [facility](HCC) Active Problems:   Moderate dementia   Dysphagia   Anorexia   Nausea and vomiting   DM2 (diabetes mellitus, type 2) (HCC)   Volume depletion   AKI (acute kidney injury) (HCC)   Hypertension   History of recent stroke   Left hemiparesis (HCC)   Protein-calorie malnutrition, severe    Time spent: 5335 MINS    [facility] - LlanoHOMPSON,DANIEL MD [facility] Pager [phone]. If 7PM-7AM, please contact night-coverage at [email or web address], password [email or web address] 10/11/2015, 4:05 PM  LOS: 3 days

## 2015-10-11 NOTE — Progress Notes (Signed)
Nuclear medicine called to state that patient's gastric emptying study, was being cancelled, due to patient not being able to swallow.  Test will have to be ordered for another date, when patient is able to swallow.

## 2015-10-11 NOTE — Progress Notes (Signed)
Barium swallow it not reveal any significant problems to explain significant dysphagia. No further GI intervention required at this point. If dysphagia persists consider modified barium swallow to look for oral pharyngeal issues. We will sign off. Call us if needed.

## 2015-10-11 NOTE — Care Management Important Message (Signed)
Important Message  Patient Details  Name: Fabio AsaMarsha P Kapral MRN: [id number] Date of Birth: 10/30/1945   Medicare Important Message Given:  Yes    Haskell FlirtJamison, Kanyla Omeara 10/11/2015, 10:54 AMImportant Message  Patient Details  Name: Fabio AsaMarsha P Hiltz MRN: [id number] Date of Birth: 03/18/1946   Medicare Important Message Given:  Yes    Haskell FlirtJamison, Afomia Blackley 10/11/2015, 10:54 AM

## 2015-10-11 NOTE — Progress Notes (Addendum)
Speech Language Pathology Treatment: Dysphagia  Patient Details Name: Fabio AsaMarsha P Swift MRN: [id number] DOB: 10/05/1945 Today's Date: 10/11/2015 Time: [id number] SLP Time Calculation (min) (ACUTE ONLY): 13 min  Assessment / Plan / Recommendation Clinical Impression  Gastric emptying study cancelled due to pt "not being able to swallow". SLP consulted with RN regarding whether this was due to inability or unwillingness, as well as pt tolerance of po's and medication thus far. RN indicates pt has had no overt difficulty with meds given with liquid. Pt was NPO for procedure this morning, and has not wanted to eat lunch due to poor appetite. Pt willing to accept only sips of water for SLP, declining trials of puree or solid consistencies. Pt exhibited no overt s/s aspiration on thin liquid trial at this time. Swallow appeared timely and with good laryngeal elevation on palpation. Voice breaks noted, but otherwise clear vocal quality observed throughout. Pt is afebrile.   HPI HPI: 70 y.o. female admitted on 10/08/2015 with pulmonary abscess, progressive dysphagia over last two weeks with weight loss, N&V.  Being followed by GI.  Barium swallow 10/09/15 reveals no evident HH or other focal esophageal abnormality; mild esophageal dysmotility, likely presbyesophagus.  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  Care Everywhere notes indicate D/C from rehab 08/01/15 with impaired communication due to dementia and dysarthria with hypophonia and vocal breaks. 07/07/15 FEES revealed mild deficits in tongue base retraction, laryngeal closure, and pharyngeal constriction.Was D/Cd  on chopped diet with thin liquids.      SLP Plan  Continue with current plan of care     Recommendations  Diet recommendations: Dysphagia 3 (mechanical soft);Thin liquid Liquids provided via: Cup;Straw Medication Administration: Whole meds with liquid Supervision: Patient able to self  feed Compensations: Minimize environmental distractions;Slow rate;Small sips/bites Postural Changes and/or Swallow Maneuvers: Seated upright 90 degrees;Upright 30-60 min after meal            Oral Care Recommendations: Oral care BID Plan: Continue with current plan of care      Leigh AuroraBueche, Celia Brown 10/11/2015, 3:03 PM  Celia B. Bueche, MSP, CCC-SLP [phone]

## 2015-10-11 NOTE — Progress Notes (Signed)
Pharmacy Antibiotic Note  Fabio AsaMarsha P Gettel is a 70 y.o. female admitted on 10/08/2015 with pulmonary abcess.  Pharmacy has been consulted for Zosyn dosing.  Pt is a 70 year old female presents with dysphagia with emesis, weight loss, and worsening kidney function.  She has progressive dysphagia over the past 2 weeks. She has not been able to take her prescribed medicine. They report associated dry cough. She presented to her PCP who did a chest CT and CT of Abdomen and pelvis on 3/31. The CT of abdomen was unremarkable however Ct of chest showed a gas and fluid containing mass within the right lower lobe, possibly a pulmonary abscess. Also showed a subpleural consolidation within the left and right lower lobes. She was prescribed Augmentin however has been unable to take due to dysphagia.     Plan:  Zosyn 3.375g IV q8h (4 hour infusion).   Monitor renal function as current CrCl is ~30 ml/min  Plan noted for 4-6 weeks of antibiotics then repeat CT to determine further therapy   Height: 5' (152.4 cm) Weight: 113 lb (51.256 kg) IBW/kg (Calculated) : 45.5  Temp (24hrs), Avg:98 F (36.7 C), Min:97.7 F (36.5 C), Max:98.4 F (36.9 C)   Recent Labs Lab 10/07/15 1033 10/08/15 1454 10/08/15 1627 10/09/15 0507 10/10/15 0520 10/11/15 0519  WBC  --  11.4*  --  12.0* 14.9* 12.1*  CREATININE 2.76* 2.11*  --  1.49* 1.35* 1.26*  LATICACIDVEN  --   --  1.18  --   --   --     Estimated Creatinine Clearance: 29.8 mL/min (by C-G formula based on Cr of 1.26).    No Known Allergies  Antimicrobials this admission: Augmentin PTA, unclear how much she took  4/5 Zosyn >>    Dose adjustments this admission: ---  Microbiology results: 4/4 UCx: NGF  Thank you for allowing pharmacy to be a part of this patient's care.  Loralee PacasErin Jaquari Reckner, PharmD, BCPS Pager: [phone]  10/11/2015 8:46 AM

## 2015-10-11 NOTE — Progress Notes (Signed)
Physical Therapy Treatment Patient Details Name: HERO MCCATHERN MRN: [id number] DOB: 1946/01/18 Today's Date: 10/11/2015    History of Present Illness 70 yo female admitted with lung abscess. Hx of CVA, HTN, DM, dementia.     PT Comments    Pt tolerated session well, very limited with verbal answers today, however had worked with OT earlier and was a little fatigued. Focus was on bed mobility and transfer to the recliner.  Also educated CNA on how to assist pt with flaccid UE and weakness on LLE due to CVA. Educated on safety precautions of UE and how to assist Mrs. Frontera to the recliner safely and how to assist her back to bed safety with transfer occuring to the right side and assist from front. Mrs. Santillo was able to weight bear and come more upright today with sit to stand 3xs and with pivot transfer.   Follow Up Recommendations  Home health PT;Supervision/Assistance - 24 hour;SNF (depending on progress and assist available at home. No family present during eval. )     Equipment Recommendations  None recommended by PT    Recommendations for Other Services       Precautions / Restrictions Precautions Precautions: Fall Precaution Comments: L hemiparesis Restrictions Weight Bearing Restrictions: No    Mobility  Bed Mobility Overal bed mobility: Needs Assistance Bed Mobility: Supine to Sit Rolling: Mod assist Sidelying to sit: Mod assist Supine to sit: Mod assist     General bed mobility comments: great difficulty with scooting to EOB, however did initiate lean and attempt to scoot hips   Transfers Overall transfer level: Needs assistance   Transfers: Stand Pivot Transfers (stand pivot twith assist from front and to right side )   Stand pivot transfers: Mod assist       General transfer comment: Assist to rise, stabilize, weightshift, block L knee, control descent. STand pivot, towards R side, bed>recliner.   Ambulation/Gait                 Stairs            Wheelchair Mobility    Modified Rankin (Stroke Patients Only)       Balance   Sitting-balance support: Feet supported;Single extremity supported Sitting balance-Leahy Scale:  (poor to fair)       Standing balance-Leahy Scale: Zero                      Cognition Arousal/Alertness: Awake/alert Behavior During Therapy: WFL for tasks assessed/performed Overall Cognitive Status:  (slow to respond at times; extra time; multimodal cues)                      Exercises      General Comments        Pertinent Vitals/Pain Pain Assessment: No/denies pain    Home Living                      Prior Function            PT Goals (current goals can now be found in the care plan section) Progress towards PT goals: Progressing toward goals    Frequency  Min 3X/week    PT Plan      Co-evaluation             End of Session Equipment Utilized During Treatment: Gait belt Activity Tolerance: Patient tolerated treatment well Patient left: in chair;with call bell/phone within reach;with chair alarm  set     Time: [id number] PT Time Calculation (min) (ACUTE ONLY): 14 min  Charges:  $Therapeutic Activity: 8-22 mins                    G CodesMarella Bile:      Sidney Silberman 10/11/2015, 4:47 PM Marella BileSharron Isaac Lacson, PT Pager: [phone] 10/11/2015

## 2015-10-11 NOTE — Progress Notes (Signed)
Occupational Therapy Treatment Patient Details Name: MEENAKSHI SAZAMA MRN: [id number] DOB: May 26, 1946 Today's Date: 10/11/2015    History of present illness 70 yo female admitted with lung abscess. Hx of CVA, HTN, DM, dementia.    OT comments  Pt tolerated session well.  Observed movement in LUE shoulder and fingers this session.   Follow Up Recommendations  SNF;Supervision/Assistance - 24 hour;Home health OT    Equipment Recommendations  None recommended by OT    Recommendations for Other Services      Precautions / Restrictions Precautions Precautions: Fall Precaution Comments: L hemiparesis Restrictions Weight Bearing Restrictions: No       Mobility Bed Mobility     Rolling: Max assist Sidelying to sit: Mod assist Supine to sit: Mod assist     General bed mobility comments: multimodal cues and extra time  Transfers                      Balance   Sitting-balance support: Feet supported;Single extremity supported Sitting balance-Leahy Scale:  (poor to fair)       Standing balance-Leahy Scale: Zero                     ADL                                         General ADL Comments: sat EOB x 18 minutes with min guard to min A.Worked on reaching with RUE. Cues to weight shift forward and to L. Placed LUE next to her for weight bearing.  Observed shoulder flexion and finger flexion this session.  Pt's LUE with edema.  Positioned up on pillows and alerted nursing.  Stood once with mod A; however did not get into full extension--max A.      Vision                     Perception     Praxis      Cognition   Behavior During Therapy: [facility] for tasks assessed/performed Overall Cognitive Status:  (slow to respond at times; extra time; multimodal cues)                       Extremity/Trunk Assessment               Exercises     Shoulder Instructions       General Comments      Pertinent Vitals/  Pain       Pain Assessment: No/denies pain  Home Living                                          Prior Functioning/Environment              Frequency Min 2X/week     Progress Toward Goals  OT Goals(current goals can now be found in the care plan section)  Progress towards OT goals: Progressing toward goals (added goals)  ADL Goals Additional ADL Goal #3: pt will sit unsupported with bil LEs on floor for 5 minutes with min guard in preparation for adls Additional ADL Goal #4: pt will assist wtih managing LUE for positioning for edema management and positioning to protect during mobility with mod A  Plan  Co-evaluation                 End of Session     Activity Tolerance Patient tolerated treatment well   Patient Left in bed;with call bell/phone within reach;with bed alarm set;with nursing/sitter in room   Nurse Communication          Time: [id number] OT Time Calculation (min): 25 min  Charges: OT General Charges $OT Visit: 1 Procedure OT Treatments $Therapeutic Activity: 23-37 mins  Bryann Mcnealy 10/11/2015, 1:55 PM  Marica OtterMaryellen Clariza Sickman, OTR/L [phone] 10/11/2015

## 2015-10-12 LAB — BASIC METABOLIC PANEL
Anion gap: 12 (ref 5–15)
BUN: 8 mg/dL (ref 6–20)
CALCIUM: 8.6 mg/dL — AB (ref 8.9–10.3)
CO2: 17 mmol/L — ABNORMAL LOW (ref 22–32)
CREATININE: 1.13 mg/dL — AB (ref 0.44–1.00)
Chloride: 113 mmol/L — ABNORMAL HIGH (ref 101–111)
GFR calc non Af Amer: 48 mL/min — ABNORMAL LOW (ref >60)
GFR, EST AFRICAN AMERICAN: 56 mL/min — AB (ref >60)
Glucose, Bld: 113 mg/dL — ABNORMAL HIGH (ref 65–99)
Potassium: 3.5 mmol/L (ref 3.5–5.1)
SODIUM: 142 mmol/L (ref 135–145)

## 2015-10-12 LAB — CBC
HCT: 24.2 % — ABNORMAL LOW (ref 36.0–46.0)
Hemoglobin: 8.5 g/dL — ABNORMAL LOW (ref 12.0–15.0)
MCH: 26.4 pg (ref 26.0–34.0)
MCHC: 35.1 g/dL (ref 30.0–36.0)
MCV: 75.2 fL — ABNORMAL LOW (ref 78.0–100.0)
PLATELETS: 338 10*3/uL (ref 150–400)
RBC: 3.22 MIL/uL — AB (ref 3.87–5.11)
RDW: 16.1 % — ABNORMAL HIGH (ref 11.5–15.5)
WBC: 10.5 10*3/uL (ref 4.0–10.5)

## 2015-10-12 LAB — GLUCOSE, CAPILLARY
GLUCOSE-CAPILLARY: 97 mg/dL (ref 65–99)
GLUCOSE-CAPILLARY: 114 mg/dL — AB (ref 65–99)
Glucose-Capillary: 154 mg/dL — ABNORMAL HIGH (ref 65–99)
Glucose-Capillary: 140 mg/dL — ABNORMAL HIGH (ref 65–99)

## 2015-10-12 MED ORDER — STERILE WATER FOR INJECTION IV SOLN
INTRAVENOUS | Status: DC
  Administered 2015-10-12 – 2015-10-13 (×2): via INTRAVENOUS
  Filled 2015-10-12 (×2): qty 850

## 2015-10-12 MED ORDER — POTASSIUM CHLORIDE CRYS ER 20 MEQ PO TBCR
40.0000 meq | EXTENDED_RELEASE_TABLET | Freq: Once | ORAL | Status: AC
  Administered 2015-10-12: 40 meq via ORAL
  Filled 2015-10-12: qty 2

## 2015-10-12 NOTE — Progress Notes (Signed)
[facility] PROGRESS NOTE  Fabio AsaMarsha P Daum ZOX:[id number] DOB: 11/03/1945 DOA: 10/08/2015 PCP: Thomos Lemonsobert Yoo, DO  Assessment/Plan: #1 lung abscess Per chest x-ray and per CT chest. Patient currently afebrile. Patient noted to have a leukocytosis. Patient denies any shortness of breath. Patient has been seen in consultation by pulmonary were recommending 4-6 weeks of antibiotics of Augmentin twice daily with repeat CT imaging at that time and further antibiotics guided by CT findings. Patient is to follow-up with pulmonary in outpatient setting. Due to recurrent dysphagia issues will continue IV Zosyn for now until dysphagia has been fully evaluated and patient able to tolerate oral intake.  #2 dysphagia Questionable etiology. Patient has been seen by gastroenterology patient underwent a barium swallow which is showing no evidence of facial or hernia or other focal esophageal abnormality. Mild esophageal dysmotility, likely presbyesophagus. In discussion with patient's son was noted that after meals which patient had initially tolerated while watching television patient was noted to have nausea and emesis. Patient is also noted to be a diabetic. Concern for possible diabetic gastroparesis. Patient unable to undergo gastric emptying study. Continue Reglan 5 mg IV every 6 hours for now. Speech therapy following. Continue dysphagia 3 diet.  Will transition to oral Reglan tomorrow. GI following and appreciate input and recommendations.  #3 hypokalemia Repleted.  #4 anemia Likely chronic in nature and dilutional. Anemia panel with iron level of 17 TIBC of 186 ferritin of 981. Folate of 10.5. Likely anemia of chronic disease. Follow H&H.  #5 leukocytosis Likely secondary to problem #1. Urinalysis is unremarkable. Continue IV Zosyn.  #6 acute renal failure Likely secondary to volume depletion and possibly contrast induced in the setting of ARB. ARB on hold. Patient on IV fluids. Patient with good urine  output per nursing. Renal function trending down. Change IV fluids to bicarbonate.   #7 history of recent pontine CVA/left hemiparesis generated 2017 Continue aspirin/Plavix for secondary probe stroke prevention.  #8 dementia Stable. Continue Namenda.  #9 diabetes mellitus Oral hypoglycemic agents on hold. Sliding scale insulin.  #10 depression Continue Zoloft.  #11 hypertension ARB and diuretics on hold. BP stable. Follow.  #12 protein calorie malnuitrition  #13 prophylaxis SCDs for DVT prophylaxis.  Code Status: Full Family Communication: Updated patient. No family at bedside. Disposition Plan: Home once medically stable and and patient tolerating a diet, and on oral antibiotics.   Consultants:  Pulmonary: Dr. Jamison NeighborNestor 10/08/2015  Gastroenterology: Dr. Madilyn FiremanHayes 10/09/2015  Procedures:  CT chest/ abdomen/pelvis 10/03/2015  Chest x-ray 10/08/2015  Barium swallow 10/09/2015   Antibiotics:  IV Zosyn 10/09/2015  HPI/Subjective: Patient ate breakfast per nursing with no emesis. Patient did not eat lunch. Patient denies any chest pain. No shortness of breath.  Objective: Filed Vitals:   10/12/15 0421 10/12/15 1500  BP: 149/80 151/80  Pulse: 71 104  Temp: 97.7 F (36.5 C) 97.9 F (36.6 C)  Resp: 20 18    Intake/Output Summary (Last 24 hours) at 10/12/15 1525 Last data filed at 10/12/15 0735  Gross per 24 hour  Intake   1335 ml  Output      0 ml  Net   1335 ml   Filed Weights   10/08/15 1425  Weight: 51.256 kg (113 lb)    Exam:   General:  NAD  Cardiovascular: RRR  Respiratory: Some coarse BS  Abdomen: Soft, nontender, nondistended, positive bowel sounds.  Musculoskeletal: No clubbing cyanosis or edema.  Data Reviewed: Basic Metabolic Panel:  Recent Labs Lab 10/08/15 1454 10/09/15 0507  10/10/15 0520 10/11/15 0516 10/11/15 0519 10/12/15 0550  NA 138 138 138  --  144 142  K 3.1* 3.0* 5.0  --  3.9 3.5  CL 99* 104 107  --  113* 113*   CO2 21* 21* 19*  --  20* 17*  GLUCOSE 174* 105* 145*  --  111* 113*  BUN 43* 30* 19  --  14 8  CREATININE 2.11* 1.49* 1.35*  --  1.26* 1.13*  CALCIUM 9.6 8.7* 9.1  --  8.8* 8.6*  MG  --  1.8 1.5* 2.6*  --   --    Liver Function Tests:  Recent Labs Lab 10/08/15 1454 10/09/15 0507  AST 14* 10*  ALT 11* 8*  ALKPHOS 93 77  BILITOT 1.1 0.7  PROT 7.9 6.5  ALBUMIN 2.5* 2.0*   No results for input(s): LIPASE, AMYLASE in the last 168 hours. No results for input(s): AMMONIA in the last 168 hours. CBC:  Recent Labs Lab 10/08/15 1454 10/09/15 0507 10/10/15 0520 10/11/15 0519 10/12/15 0550  WBC 11.4* 12.0* 14.9* 12.1* 10.5  NEUTROABS 9.5*  --   --   --   --   HGB 10.6* 8.7* 9.5* 8.8* 8.5*  HCT 30.6* 24.7* 26.7* 25.2* 24.2*  MCV 74.6* 73.1* 73.2* 75.2* 75.2*  PLT 356 297 331 318 338   Cardiac Enzymes: No results for input(s): CKTOTAL, CKMB, CKMBINDEX, TROPONINI in the last 168 hours. BNP (last 3 results) No results for input(s): BNP in the last 8760 hours.  ProBNP (last 3 results) No results for input(s): PROBNP in the last 8760 hours.  CBG:  Recent Labs Lab 10/11/15 1112 10/11/15 1640 10/11/15 2123 10/12/15 0745 10/12/15 1150  GLUCAP 96 93 109* 97 154*    Recent Results (from the past 240 hour(s))  Urine culture     Status: None   Collection Time: 10/08/15  5:32 PM  Result Value Ref Range Status   Specimen Description URINE, CATHETERIZED  Final   Special Requests Normal  Final   Culture   Final    NO GROWTH 1 DAY Performed at [facility]    Report Status 10/09/2015 FINAL  Final     Studies: No results found.  Scheduled Meds: . aspirin  81 mg Oral Daily  . atorvastatin  40 mg Oral Daily  . clopidogrel  75 mg Oral Daily  . donepezil  10 mg Oral QHS  . insulin aspart  0-5 Units Subcutaneous QHS  . insulin aspart  0-9 Units Subcutaneous TID WC  . memantine  28 mg Oral Daily  . metoCLOPramide (REGLAN) injection  5 mg Intravenous 4 times per  day  . multivitamin with minerals  1 tablet Oral Daily  . NIFEdipine  90 mg Oral Daily  . piperacillin-tazobactam (ZOSYN)  IV  3.375 g Intravenous Q8H  . sertraline  100 mg Oral Daily   Continuous Infusions: .  sodium bicarbonate 150 mEq in sterile water 1000 mL infusion 75 mL/hr at 10/12/15 1036    Principal Problem:   Lung abscess (HCC) Active Problems:   Moderate dementia   Dysphagia   Anorexia   Nausea and vomiting   DM2 (diabetes mellitus, type 2) (HCC)   Volume depletion   AKI (acute kidney injury) (HCC)   Hypertension   History of recent stroke   Left hemiparesis (HCC)   Protein-calorie malnutrition, severe    Time spent: 46 MINS    [facility] MD [facility] Pager [phone]. If 7PM-7AM, please contact night-coverage at  [email or web address], password [facility] 10/12/2015, 3:25 PM  LOS: 4 days

## 2015-10-12 NOTE — Progress Notes (Signed)
Pharmacy Antibiotic Note  Lisa Shaffer is a 70 y.o. female admitted on 10/08/2015 with pulmonary abcess.  Pharmacy has been consulted for Zosyn dosing.  Pt is a 70 year old female presents with dysphagia with emesis, weight loss, and worsening kidney function.  She has progressive dysphagia over the past 2 weeks. She has not been able to take her prescribed medicine. They report associated dry cough. She presented to her PCP who did a chest CT and CT of Abdomen and pelvis on 3/31. The CT of abdomen was unremarkable however Ct of chest showed a gas and fluid containing mass within the right lower lobe, possibly a pulmonary abscess. Also showed a subpleural consolidation within the left and right lower lobes. She was prescribed Augmentin however has been unable to take due to dysphagia.     Plan:  Zosyn 3.375g IV q8h (4 hour infusion).   Plan noted for 4-6 weeks of antibiotics then repeat CT to determine further therapy  SCr continues to improve, CrCl remaining >20 ml/min. Do not anticipate further dose adjustments.  Pharmacy will sign off. Please re-consult if needed.   Height: 5' (152.4 cm) Weight: 113 lb (51.256 kg) IBW/kg (Calculated) : 45.5  Temp (24hrs), Avg:97.9 F (36.6 C), Min:97.7 F (36.5 C), Max:98.2 F (36.8 C)   Recent Labs Lab 10/08/15 1454 10/08/15 1627 10/09/15 0507 10/10/15 0520 10/11/15 0519 10/12/15 0550  WBC 11.4*  --  12.0* 14.9* 12.1* 10.5  CREATININE 2.11*  --  1.49* 1.35* 1.26* 1.13*  LATICACIDVEN  --  1.18  --   --   --   --     Estimated Creatinine Clearance: 33.3 mL/min (by C-G formula based on Cr of 1.13).    No Known Allergies  Antimicrobials this admission: 3/30 >> Augmentin (PTA) >> 4/2 4/5 >> Zosyn >>  Dose adjustments this admission: ----  Microbiology results: 4/4 UCx: NGF  Thank you for allowing pharmacy to be a part of this patient's care.  Clance BollAmanda Jaivion Kingsley, PharmD, BCPS Pager: [phone] 10/12/2015 11:10 AM

## 2015-10-13 LAB — CBC
HEMATOCRIT: 24.7 % — AB (ref 36.0–46.0)
HEMOGLOBIN: 8.7 g/dL — AB (ref 12.0–15.0)
MCH: 25.9 pg — AB (ref 26.0–34.0)
MCHC: 35.2 g/dL (ref 30.0–36.0)
MCV: 73.5 fL — ABNORMAL LOW (ref 78.0–100.0)
Platelets: 367 10*3/uL (ref 150–400)
RBC: 3.36 MIL/uL — ABNORMAL LOW (ref 3.87–5.11)
RDW: 15.8 % — AB (ref 11.5–15.5)
WBC: 10.9 10*3/uL — ABNORMAL HIGH (ref 4.0–10.5)

## 2015-10-13 LAB — BASIC METABOLIC PANEL
ANION GAP: 12 (ref 5–15)
BUN: 6 mg/dL (ref 6–20)
CO2: 28 mmol/L (ref 22–32)
Calcium: 8.7 mg/dL — ABNORMAL LOW (ref 8.9–10.3)
Chloride: 107 mmol/L (ref 101–111)
Creatinine, Ser: 1.11 mg/dL — ABNORMAL HIGH (ref 0.44–1.00)
GFR calc Af Amer: 57 mL/min — ABNORMAL LOW (ref >60)
GFR calc non Af Amer: 49 mL/min — ABNORMAL LOW (ref >60)
GLUCOSE: 110 mg/dL — AB (ref 65–99)
POTASSIUM: 3.3 mmol/L — AB (ref 3.5–5.1)
Sodium: 147 mmol/L — ABNORMAL HIGH (ref 135–145)

## 2015-10-13 LAB — GLUCOSE, CAPILLARY
GLUCOSE-CAPILLARY: 77 mg/dL (ref 65–99)
GLUCOSE-CAPILLARY: 90 mg/dL (ref 65–99)
Glucose-Capillary: 105 mg/dL — ABNORMAL HIGH (ref 65–99)
Glucose-Capillary: 159 mg/dL — ABNORMAL HIGH (ref 65–99)

## 2015-10-13 MED ORDER — SODIUM CHLORIDE 0.45 % IV SOLN
INTRAVENOUS | Status: DC
  Administered 2015-10-13: 10:00:00 via INTRAVENOUS

## 2015-10-13 MED ORDER — POTASSIUM CHLORIDE CRYS ER 20 MEQ PO TBCR
40.0000 meq | EXTENDED_RELEASE_TABLET | Freq: Once | ORAL | Status: AC
  Administered 2015-10-13: 40 meq via ORAL
  Filled 2015-10-13: qty 2

## 2015-10-13 MED ORDER — SODIUM CHLORIDE 0.45 % IV SOLN: INTRAVENOUS | Status: DC

## 2015-10-13 MED ORDER — METOCLOPRAMIDE HCL 5 MG PO TABS
5.0000 mg | ORAL_TABLET | Freq: Three times a day (TID) | ORAL | Status: DC
  Administered 2015-10-13 – 2015-10-15 (×9): 5 mg via ORAL
  Filled 2015-10-13 (×11): qty 1

## 2015-10-13 MED ORDER — SACCHAROMYCES BOULARDII 250 MG PO CAPS
250.0000 mg | ORAL_CAPSULE | Freq: Two times a day (BID) | ORAL | Status: DC
  Administered 2015-10-13 – 2015-10-15 (×5): 250 mg via ORAL
  Filled 2015-10-13 (×7): qty 1

## 2015-10-13 MED ORDER — AMOXICILLIN-POT CLAVULANATE 875-125 MG PO TABS
1.0000 | ORAL_TABLET | Freq: Two times a day (BID) | ORAL | Status: DC
  Administered 2015-10-13 – 2015-10-15 (×5): 1 via ORAL
  Filled 2015-10-13 (×6): qty 1

## 2015-10-13 NOTE — Progress Notes (Addendum)
[facility] PROGRESS NOTE  Lisa Shaffer ZOX:[id number] DOB: May 09, 1946 DOA: 10/08/2015 PCP: [name], DO  Assessment/Plan: #1 lung abscess Per chest x-ray and per CT chest. Patient currently afebrile. Patient noted to have a leukocytosis. Patient denies any shortness of breath. Patient has been seen in consultation by pulmonary were recommending 4-6 weeks of antibiotics of Augmentin twice daily with repeat CT imaging at that time and further antibiotics guided by CT findings. Patient is to follow-up with pulmonary in outpatient setting. Patient tolerating oral intake with IV Reglan. Change IV Reglan to oral Reglan. Change IV Zosyn to oral Augmentin.  #2 dysphagia Questionable etiology. Patient has been seen by gastroenterology patient underwent a barium swallow which is showing no evidence of facial or hernia or other focal esophageal abnormality. Mild esophageal dysmotility, likely presbyesophagus. In discussion with patient's son was noted that after meals which patient had initially tolerated while watching television patient was noted to have nausea and emesis. Patient is also noted to be a diabetic. Concern for possible diabetic gastroparesis. Patient unable to undergo gastric emptying study. Change IV regular to oral Reglan. Speech therapy following. Continue dysphagia 3 diet. GI following and appreciate input and recommendations.  #3 hypokalemia Replete.  #4 anemia Likely chronic in nature and dilutional. Anemia panel with iron level of 17 TIBC of 186 ferritin of 981. Folate of 10.5. Likely anemia of chronic disease. Follow H&H.  #5 leukocytosis Likely secondary to problem #1. Urinalysis is unremarkable. Change IV Zosyn to oral Augmentin.   #6 acute renal failure Likely secondary to volume depletion and possibly contrast induced in the setting of ARB. ARB on hold. Patient on IV fluids. Patient with good urine output per nursing. Renal function trending down. Change IV fluids to  half-normal saline.   #7 history of recent pontine CVA/left hemiparesis generated 2017 Continue aspirin/Plavix for secondary probe stroke prevention.  #8 dementia Stable. Continue Namenda.  #9 diabetes mellitus Oral hypoglycemic agents on hold. Sliding scale insulin.  #10 depression Continue Zoloft.  #11 hypertension ARB and diuretics on hold. BP stable. Follow.  #12 protein calorie malnuitrition  #13 diarrhea Check a C. difficile PCR as patient on antibiotics. Place on Florastor. If C. difficile PCR is negative will place on Imodium as needed.  #14 prophylaxis SCDs for DVT prophylaxis.  Code Status: Full Family Communication: Updated patient and son at bedside. Disposition Plan: Home once medically stable and and patient tolerating a diet, and on oral antibiotics, with resolution of diarrhea hopefully 1-2 days.   Consultants:  Pulmonary: [name] 10/08/2015  Gastroenterology: Dr. Madilyn Fireman 10/09/2015  Procedures:  CT chest/ abdomen/pelvis 10/03/2015  Chest x-ray 10/08/2015  Barium swallow 10/09/2015   Antibiotics:  IV Zosyn 10/09/2015>>>> 10/12/2015  Oral Augmentin 10/13/2015  HPI/Subjective: Patient ate breakfast per nursing with no emesis. Patient tolerating oral antibiotics per nursing. Patient denies any chest pain. No shortness of breath. Per nursing patient with multiple loose stools today with a pungent odor.  Objective: Filed Vitals:   10/13/15 0653 10/13/15 1349  BP: 142/81 152/76  Pulse: 98 93  Temp: 98.8 F (37.1 C) 98.1 F (36.7 C)  Resp: 16 18    Intake/Output Summary (Last 24 hours) at 10/13/15 1559 Last data filed at 10/13/15 1300  Gross per 24 hour  Intake    650 ml  Output      0 ml  Net    650 ml   Filed Weights   10/08/15 1425  Weight: 51.256 kg (113 lb)  Exam:   General:  NAD  Cardiovascular: RRR  Respiratory: CTAB anterior lung fields.  Abdomen: Soft, nontender, nondistended, positive bowel  sounds.  Musculoskeletal: No clubbing cyanosis or edema.  Data Reviewed: Basic Metabolic Panel:  Recent Labs Lab 10/09/15 0507 10/10/15 0520 10/11/15 0516 10/11/15 0519 10/12/15 0550 10/13/15 0547  NA 138 138  --  144 142 147*  K 3.0* 5.0  --  3.9 3.5 3.3*  CL 104 107  --  113* 113* 107  CO2 21* 19*  --  20* 17* 28  GLUCOSE 105* 145*  --  111* 113* 110*  BUN 30* 19  --  CREATININE 1.49* 1.35*  --  1.26* 1.13* 1.11*  CALCIUM 8.7* 9.1  --  8.8* 8.6* 8.7*  MG 1.8 1.5* 2.6*  --   --   --    Liver Function Tests:  Recent Labs Lab 10/08/15 1454 10/09/15 0507  AST 14* 10*  ALT 11* 8*  ALKPHOS 93 77  BILITOT 1.1 0.7  PROT 7.9 6.5  ALBUMIN 2.5* 2.0*   No results for input(s): LIPASE, AMYLASE in the last 168 hours. No results for input(s): AMMONIA in the last 168 hours. CBC:  Recent Labs Lab 10/08/15 1454 10/09/15 0507 10/10/15 0520 10/11/15 0519 10/12/15 0550 10/13/15 0547  WBC 11.4* 12.0* 14.9* 12.1* 10.5 10.9*  NEUTROABS 9.5*  --   --   --   --   --   HGB 10.6* 8.7* 9.5* 8.8* 8.5* 8.7*  HCT 30.6* 24.7* 26.7* 25.2* 24.2* 24.7*  MCV 74.6* 73.1* 73.2* 75.2* 75.2* 73.5*  PLT 356 297 331 318 338 367   Cardiac Enzymes: No results for input(s): CKTOTAL, CKMB, CKMBINDEX, TROPONINI in the last 168 hours. BNP (last 3 results) No results for input(s): BNP in the last 8760 hours.  ProBNP (last 3 results) No results for input(s): PROBNP in the last 8760 hours.  CBG:  Recent Labs Lab 10/12/15 1150 10/12/15 1652 10/12/15 2119 10/13/15 0737 10/13/15 1154  GLUCAP 154* 114* 140* 105* 159*    Recent Results (from the past 240 hour(s))  Urine culture     Status: None   Collection Time: 10/08/15  5:32 PM  Result Value Ref Range Status   Specimen Description URINE, CATHETERIZED  Final   Special Requests Normal  Final   Culture   Final    NO GROWTH 1 DAY Performed at [facility]    Report Status 10/09/2015 FINAL  Final     Studies: No  results found.  Scheduled Meds: . amoxicillin-clavulanate  1 tablet Oral Q12H  . aspirin  81 mg Oral Daily  . atorvastatin  40 mg Oral Daily  . clopidogrel  75 mg Oral Daily  . donepezil  10 mg Oral QHS  . insulin aspart  0-5 Units Subcutaneous QHS  . insulin aspart  0-9 Units Subcutaneous TID WC  . memantine  28 mg Oral Daily  . metoCLOPramide  5 mg Oral TID AC & HS  . multivitamin with minerals  1 tablet Oral Daily  . NIFEdipine  90 mg Oral Daily  . saccharomyces boulardii  250 mg Oral BID  . sertraline  100 mg Oral Daily   Continuous Infusions: . sodium chloride 75 mL/hr at 10/13/15 1004    Principal Problem:   Lung abscess (HCC) Active Problems:   Moderate dementia   Dysphagia   Anorexia   Nausea and vomiting   DM2 (diabetes mellitus, type 2) (HCC)   Volume  depletion   AKI (acute kidney injury) (HCC)   Hypertension   History of recent stroke   Left hemiparesis (HCC)   Protein-calorie malnutrition, severe    Time spent: 4835 MINS    [facility] Associates IncHOMPSON,DANIEL MD [facility] Pager [phone]. If 7PM-7AM, please contact night-coverage at [email or web address], password [email or web address] 10/13/2015, 3:59 PM  LOS: 5 days

## 2015-10-14 ENCOUNTER — Other Ambulatory Visit: Payer: Self-pay | Admitting: Pulmonary Disease

## 2015-10-14 ENCOUNTER — Encounter: Payer: Self-pay | Admitting: *Deleted

## 2015-10-14 DIAGNOSIS — J984 Other disorders of lung: Secondary | ICD-10-CM

## 2015-10-14 LAB — GLUCOSE, CAPILLARY
GLUCOSE-CAPILLARY: 141 mg/dL — AB (ref 65–99)
Glucose-Capillary: 100 mg/dL — ABNORMAL HIGH (ref 65–99)
Glucose-Capillary: 94 mg/dL (ref 65–99)
Glucose-Capillary: 100 mg/dL — ABNORMAL HIGH (ref 65–99)

## 2015-10-14 LAB — CBC
HEMATOCRIT: 26.1 % — AB (ref 36.0–46.0)
Hemoglobin: 9 g/dL — ABNORMAL LOW (ref 12.0–15.0)
MCH: 26.1 pg (ref 26.0–34.0)
MCHC: 34.5 g/dL (ref 30.0–36.0)
MCV: 75.7 fL — AB (ref 78.0–100.0)
PLATELETS: 358 10*3/uL (ref 150–400)
RBC: 3.45 MIL/uL — AB (ref 3.87–5.11)
RDW: 16 % — ABNORMAL HIGH (ref 11.5–15.5)
WBC: 9.9 10*3/uL (ref 4.0–10.5)

## 2015-10-14 LAB — BASIC METABOLIC PANEL
Anion gap: 12 (ref 5–15)
BUN: 5 mg/dL — ABNORMAL LOW (ref 6–20)
CHLORIDE: 107 mmol/L (ref 101–111)
CO2: 25 mmol/L (ref 22–32)
CREATININE: 0.95 mg/dL (ref 0.44–1.00)
Calcium: 8.4 mg/dL — ABNORMAL LOW (ref 8.9–10.3)
GFR, EST NON AFRICAN AMERICAN: 59 mL/min — AB (ref >60)
Glucose, Bld: 95 mg/dL (ref 65–99)
POTASSIUM: 2.9 mmol/L — AB (ref 3.5–5.1)
SODIUM: 144 mmol/L (ref 135–145)

## 2015-10-14 MED ORDER — POTASSIUM CHLORIDE CRYS ER 20 MEQ PO TBCR
40.0000 meq | EXTENDED_RELEASE_TABLET | ORAL | Status: AC
  Administered 2015-10-14 (×3): 40 meq via ORAL
  Filled 2015-10-14 (×3): qty 2

## 2015-10-14 NOTE — Care Management Important Message (Signed)
Important Message  Patient Details IM Letter given to Nora/Case Manager to present to Patient Name: Fabio AsaMarsha P Friel MRN: [id number] Date of Birth: 05/15/1946   Medicare Important Message Given:  Yes    Haskell FlirtJamison, Breuna Loveall 10/14/2015, 11:14 AMImportant Message  Patient Details  Name: Fabio AsaMarsha P Caspers MRN: [id number] Date of Birth: 11/15/1945   Medicare Important Message Given:  Yes    Haskell FlirtJamison, Mindi Akerson 10/14/2015, 11:14 AM

## 2015-10-14 NOTE — Progress Notes (Signed)
Occupational Therapy Treatment Patient Details Name: Lisa AsaMarsha P Shaffer MRN: [id number] DOB: 11/12/1945 Today's Date: 10/14/2015    History of present illness 70 yo female admitted with lung abscess. Hx of CVA, HTN, DM, dementia.    OT comments  Pt with improving balance and activity tolerance.  Spouse states he plans for pt to return home with him, and is comfortable providing necessary level of care.  Currently, she requires max A for ADLs.  Recommend HHOT.   Follow Up Recommendations  Supervision/Assistance - 24 hour;Home health OT    Equipment Recommendations  None recommended by OT    Recommendations for Other Services      Precautions / Restrictions Precautions Precautions: Fall Precaution Comments: L hemiparesis Restrictions Weight Bearing Restrictions: No       Mobility Bed Mobility Overal bed mobility: Needs Assistance Bed Mobility: Supine to Sit     Supine to sit: HOB elevated;Mod assist     General bed mobility comments: great difficulty with scooting to EOB, however did initiate lean and attempt to scoot hips. Assist for trunk and L LE. Increased time. Utilized bedpad for scooting, positioning.   Transfers Overall transfer level: Needs assistance Equipment used: 1 person hand held assist Transfers: Sit to/from Stand Sit to Stand: Mod assist Stand pivot transfers: Mod assist       General transfer comment: Sit to stand x 2 with blocking of L knee for added stability. Stand pivot, bed to recliner, towards R side. Assist to rise, stabilize, weightshift, move L LE.     Balance Overall balance assessment: Needs assistance Sitting-balance support: Feet supported Sitting balance-Leahy Scale: Fair Sitting balance - Comments: very close guarding. Pt is able to sit unsupported for brief periods. LOB posteriorly x 1.    Standing balance support: Single extremity supported;Bilateral upper extremity supported Standing balance-Leahy Scale: Poor Standing balance  comment: Pt stood with mod A.  facilitation at ishiums for hip extension                    ADL Overall ADL's : Needs assistance/impaired                         Toilet Transfer: Moderate assistance;Stand-pivot;BSC   Toileting- Clothing Manipulation and Hygiene: Maximal assistance;Sit to/from stand Toileting - Clothing Manipulation Details (indicate cue type and reason): Pt incontinent of urine.  Assisted with peri care               Vision                     Perception     Praxis      Cognition   Behavior During Therapy: [facility]FL for tasks assessed/performed Overall Cognitive Status: History of cognitive impairments - at baseline                       Extremity/Trunk Assessment               Exercises Other Exercises Other Exercises: Facilitation of Lt UE functionPt able to use Lt UE as a support.  Fingers with extensor tightness of the MCP and edema noted.  Soft tissue mobilizations performed MCPs and digits followed by PROM/stretch and retrograde massage.  Lt UE elevated on pillow at end of session    Shoulder Instructions       General Comments      Pertinent Vitals/ Pain       Pain Assessment:  No/denies pain  Home Living                                          Prior Functioning/Environment              Frequency Min 2X/week     Progress Toward Goals  OT Goals(current goals can now be found in the care plan section)  Progress towards OT goals: Progressing toward goals  ADL Goals Pt Will Transfer to Toilet: with min assist;bedside commode;stand pivot transfer Additional ADL Goal #1: pt will go from sit to stand with min A and maintain for 2 minutes with min A for adls using AD for RUE for support Additional ADL Goal #2: pt will perform bed mobility at min A level in preparation for ADLs/toilet transfers Additional ADL Goal #3: pt will sit unsupported with bil LEs on floor for 5 minutes with min  guard in preparation for adls Additional ADL Goal #4: pt will assist wtih managing LUE for positioning for edema management and positioning to protect during mobility with mod A  Plan Discharge plan needs to be updated    Co-evaluation                 End of Session     Activity Tolerance Patient limited by fatigue   Patient Left in chair;with call bell/phone within reach;with family/visitor present;with chair alarm set   Nurse Communication Mobility status        Time: 4782-9562 OT Time Calculation (min): 31 min  Charges: OT General Charges $OT Visit: 1 Procedure OT Treatments $Neuromuscular Re-education: 23-37 mins  Lisa Shaffer 10/14/2015, 1:23 PM

## 2015-10-14 NOTE — Progress Notes (Addendum)
Physical Therapy Treatment Patient Details Name: Lisa AsaMarsha P Vassel MRN: [id number] DOB: 10/09/1945 Today's Date: 10/14/2015    History of Present Illness 70 yo female admitted with lung abscess. Hx of CVA, HTN, DM, dementia.     PT Comments    Pt continues to require Mod assist for all tasks. Total assist for hygiene-bladder and bowel incontinence. Pt tolerated session well. Coughing spell for most of session. Husband present during session-very informative and involved in patient's care. Discussed d/c plan-pt will return home with husband and son; HHPT to resume.    Follow Up Recommendations  Home health PT;Supervision/Assistance - 24 hour (husband present-states he will take pt back home with HHPT follow up)     Equipment Recommendations  None recommended by PT    Recommendations for Other Services       Precautions / Restrictions Precautions Precautions: Fall Precaution Comments: L hemiparesis Restrictions Weight Bearing Restrictions: No    Mobility  Bed Mobility Overal bed mobility: Needs Assistance Bed Mobility: Supine to Sit     Supine to sit: HOB elevated;Mod assist     General bed mobility comments: great difficulty with scooting to EOB, however did initiate lean and attempt to scoot hips. Assist for trunk and L LE. Increased time. Utilized bedpad for scooting, positioning.   Transfers Overall transfer level: Needs assistance   Transfers: Sit to/from Stand Sit to Stand: Mod assist Stand pivot transfers: Mod assist       General transfer comment: Sit to stand x 2 with blocking of L knee for added stability. Stand pivot, bed to recliner, towards R side. Assist to rise, stabilize, weightshift, move L LE.   Ambulation/Gait             General Gait Details: NT   Stairs            Wheelchair Mobility    Modified Rankin (Stroke Patients Only)       Balance Overall balance assessment: Needs assistance   Sitting balance-Leahy Scale:  Poor Sitting balance - Comments: very close guarding. Pt is able to sit unsupported for brief periods. LOB posteriorly x 1.                             Cognition Arousal/Alertness: Awake/alert Behavior During Therapy: WFL for tasks assessed/performed Overall Cognitive Status: History of cognitive impairments - at baseline                      Exercises      General Comments        Pertinent Vitals/Pain Pain Assessment: No/denies pain    Home Living                      Prior Function            PT Goals (current goals can now be found in the care plan section) Progress towards PT goals: Progressing toward goals    Frequency  Min 3X/week    PT Plan Current plan remains appropriate    Co-evaluation             End of Session Equipment Utilized During Treatment: Gait belt Activity Tolerance: Patient tolerated treatment well Patient left: in chair;with call bell/phone within reach;with family/visitor present     Time: [id number] PT Time Calculation (min) (ACUTE ONLY): 37 min  Charges:  $Therapeutic Activity: 23-37 mins  G Codes:      Weston Anna, MPT Pager: [phone]

## 2015-10-14 NOTE — Progress Notes (Signed)
[facility] PROGRESS NOTE  Lisa Shaffer VWU:[id number] DOB: 01/23/1946 DOA: 10/08/2015 PCP: Thomos Lemonsobert Yoo, DO  Assessment/Plan: #1 lung abscess Per chest x-ray and per CT chest. Patient currently afebrile. Patient noted to have a leukocytosis. Patient denies any shortness of breath. Patient has been seen in consultation by pulmonary were recommending 4-6 weeks of antibiotics of Augmentin twice daily with repeat CT imaging at that time and further antibiotics guided by CT findings. Patient is to follow-up with pulmonary in outpatient setting. Patient tolerating oral intake with Reglan. Continue oral Reglan and Augmentin.  #2 dysphagia Questionable etiology. Patient has been seen by gastroenterology patient underwent a barium swallow which is showing no evidence of facial or hernia or other focal esophageal abnormality. Mild esophageal dysmotility, likely presbyesophagus. In discussion with patient's son was noted that after meals which patient had initially tolerated while watching television patient was noted to have nausea and emesis. Patient is also noted to be a diabetic. Concern for possible diabetic gastroparesis. Patient unable to undergo gastric emptying study. Continue oral Reglan. Speech therapy following. Continue dysphagia 3 diet. GI following and appreciate input and recommendations.  #3 hypokalemia Replete.  #4 anemia Likely chronic in nature and dilutional. Anemia panel with iron level of 17 TIBC of 186 ferritin of 981. Folate of 10.5. Likely anemia of chronic disease. Follow H&H.  #5 leukocytosis Likely secondary to problem #1. Urinalysis is unremarkable. Continue oral Augmentin.   #6 acute renal failure Likely secondary to volume depletion and possibly contrast induced in the setting of ARB. ARB on hold. Saline lock IV fluids. Patient with good urine output per nursing. Renal function trending down.  #7 history of recent pontine CVA/left hemiparesis generated 2017 Continue  aspirin/Plavix for secondary probe stroke prevention.  #8 dementia Stable. Continue Namenda.  #9 diabetes mellitus Oral hypoglycemic agents on hold. Sliding scale insulin.  #10 depression Continue Zoloft.  #11 hypertension ARB and diuretics on hold. BP stable. Follow.  #12 protein calorie malnuitrition  #13 diarrhea Patient with no further loose stools. Continue Florastor.   #14 prophylaxis SCDs for DVT prophylaxis.  Code Status: Full Family Communication: Updated patient and son at bedside. Disposition Plan: Home once medically stable and and patient tolerating a diet, and on oral antibiotics, with resolution of diarrhea hopefully tomorrow.   Consultants:  Pulmonary: Dr. Jamison NeighborNestor 10/08/2015  Gastroenterology: Dr. Madilyn FiremanHayes 10/09/2015  Procedures:  CT chest/ abdomen/pelvis 10/03/2015  Chest x-ray 10/08/2015  Barium swallow 10/09/2015   Antibiotics:  IV Zosyn 10/09/2015>>>> 10/12/2015  Oral Augmentin 10/13/2015  HPI/Subjective: Patient ate breakfast per nursing with no emesis. Patient tolerating oral antibiotics per nursing. Patient denies any chest pain. No shortness of breath. Per nursing patient with no BM.  Objective: Filed Vitals:   10/13/15 2046 10/14/15 0356  BP: 133/83 136/72  Pulse: 96 91  Temp: 98.5 F (36.9 C) 98.4 F (36.9 C)  Resp: 18 16    Intake/Output Summary (Last 24 hours) at 10/14/15 1423 Last data filed at 10/14/15 0944  Gross per 24 hour  Intake   1460 ml  Output      0 ml  Net   1460 ml   Filed Weights   10/08/15 1425  Weight: 51.256 kg (113 lb)    Exam:   General:  NAD  Cardiovascular: RRR  Respiratory: CTAB anterior lung fields.  Abdomen: Soft, nontender, nondistended, positive bowel sounds.  Musculoskeletal: No clubbing cyanosis or edema.  Data Reviewed: Basic Metabolic Panel:  Recent Labs Lab 10/09/15 0507 10/10/15 0520 10/11/15  1610 10/11/15 0519 10/12/15 0550 10/13/15 0547 10/14/15 0526  NA 138 138   --  144 142 147* 144  K 3.0* 5.0  --  3.9 3.5 3.3* 2.9*  CL 104 107  --  113* 113* 107 107  CO2 21* 19*  --  20* 17* 28 25  GLUCOSE 105* 145*  --  111* 113* 110* 95  BUN 30* 19  --  5*  CREATININE 1.49* 1.35*  --  1.26* 1.13* 1.11* 0.95  CALCIUM 8.7* 9.1  --  8.8* 8.6* 8.7* 8.4*  MG 1.8 1.5* 2.6*  --   --   --   --    Liver Function Tests:  Recent Labs Lab 10/08/15 1454 10/09/15 0507  AST 14* 10*  ALT 11* 8*  ALKPHOS 93 77  BILITOT 1.1 0.7  PROT 7.9 6.5  ALBUMIN 2.5* 2.0*   No results for input(s): LIPASE, AMYLASE in the last 168 hours. No results for input(s): AMMONIA in the last 168 hours. CBC:  Recent Labs Lab 10/08/15 1454  10/10/15 0520 10/11/15 0519 10/12/15 0550 10/13/15 0547 10/14/15 0526  WBC 11.4*  < > 14.9* 12.1* 10.5 10.9* 9.9  NEUTROABS 9.5*  --   --   --   --   --   --   HGB 10.6*  < > 9.5* 8.8* 8.5* 8.7* 9.0*  HCT 30.6*  < > 26.7* 25.2* 24.2* 24.7* 26.1*  MCV 74.6*  < > 73.2* 75.2* 75.2* 73.5* 75.7*  PLT 356  < > 331 318 338 367 358  < > = values in this interval not displayed. Cardiac Enzymes: No results for input(s): CKTOTAL, CKMB, CKMBINDEX, TROPONINI in the last 168 hours. BNP (last 3 results) No results for input(s): BNP in the last 8760 hours.  ProBNP (last 3 results) No results for input(s): PROBNP in the last 8760 hours.  CBG:  Recent Labs Lab 10/13/15 1154 10/13/15 1630 10/13/15 2049 10/14/15 0736 10/14/15 1129  GLUCAP 159* 77 90 100* 141*    Recent Results (from the past 240 hour(s))  Urine culture     Status: None   Collection Time: 10/08/15  5:32 PM  Result Value Ref Range Status   Specimen Description URINE, CATHETERIZED  Final   Special Requests Normal  Final   Culture   Final    NO GROWTH 1 DAY Performed at [facility]    Report Status 10/09/2015 FINAL  Final     Studies: No results found.  Scheduled Meds: . amoxicillin-clavulanate  1 tablet Oral Q12H  . aspirin  81 mg Oral Daily  .  atorvastatin  40 mg Oral Daily  . clopidogrel  75 mg Oral Daily  . donepezil  10 mg Oral QHS  . insulin aspart  0-5 Units Subcutaneous QHS  . insulin aspart  0-9 Units Subcutaneous TID WC  . memantine  28 mg Oral Daily  . metoCLOPramide  5 mg Oral TID AC & HS  . multivitamin with minerals  1 tablet Oral Daily  . NIFEdipine  90 mg Oral Daily  . potassium chloride  40 mEq Oral Q4H  . saccharomyces boulardii  250 mg Oral BID  . sertraline  100 mg Oral Daily   Continuous Infusions:    Principal Problem:   Lung abscess (HCC) Active Problems:   Moderate dementia   Dysphagia   Anorexia   Nausea and vomiting   DM2 (diabetes mellitus, type 2) (HCC)   Volume depletion   AKI (  acute kidney injury) (HCC)   Hypertension   History of recent stroke   Left hemiparesis (HCC)   Protein-calorie malnutrition, severe    Time spent: 72 MINS    [facility] MD [facility] Pager [phone]. If 7PM-7AM, please contact night-coverage at [email or web address], password [email or web address] 10/14/2015, 2:23 PM  LOS: 6 days

## 2015-10-15 LAB — BASIC METABOLIC PANEL
Anion gap: 8 (ref 5–15)
BUN: 5 mg/dL — ABNORMAL LOW (ref 6–20)
CHLORIDE: 111 mmol/L (ref 101–111)
CO2: 26 mmol/L (ref 22–32)
Calcium: 8.7 mg/dL — ABNORMAL LOW (ref 8.9–10.3)
Creatinine, Ser: 1.02 mg/dL — ABNORMAL HIGH (ref 0.44–1.00)
GFR calc non Af Amer: 54 mL/min — ABNORMAL LOW (ref >60)
Glucose, Bld: 97 mg/dL (ref 65–99)
POTASSIUM: 4.1 mmol/L (ref 3.5–5.1)
SODIUM: 145 mmol/L (ref 135–145)

## 2015-10-15 LAB — GLUCOSE, CAPILLARY
GLUCOSE-CAPILLARY: 101 mg/dL — AB (ref 65–99)
GLUCOSE-CAPILLARY: 90 mg/dL (ref 65–99)
Glucose-Capillary: 151 mg/dL — ABNORMAL HIGH (ref 65–99)

## 2015-10-15 MED ORDER — METOCLOPRAMIDE HCL 5 MG PO TABS: 5.0000 mg | ORAL_TABLET | Freq: Three times a day (TID) | ORAL | Status: AC

## 2015-10-15 MED ORDER — ENSURE ENLIVE PO LIQD: 237.0000 mL | Freq: Two times a day (BID) | ORAL | Status: AC

## 2015-10-15 MED ORDER — ENSURE ENLIVE PO LIQD
237.0000 mL | Freq: Two times a day (BID) | ORAL | Status: DC
  Administered 2015-10-15: 237 mL via ORAL

## 2015-10-15 MED ORDER — AMOXICILLIN-POT CLAVULANATE 875-125 MG PO TABS: 1.0000 | ORAL_TABLET | Freq: Two times a day (BID) | ORAL | Status: DC

## 2015-10-15 MED ORDER — SACCHAROMYCES BOULARDII 250 MG PO CAPS: 250.0000 mg | ORAL_CAPSULE | Freq: Two times a day (BID) | ORAL | Status: AC

## 2015-10-15 NOTE — Progress Notes (Signed)
Nutrition Follow-up  DOCUMENTATION CODES:   Severe malnutrition in context of chronic illness  INTERVENTION:  - Will order Ensure Enlive po BID, each supplement provides 350 kcal and 20 grams of protein - RD will continue to monitor for needs prior to d/c  NUTRITION DIAGNOSIS:   Malnutrition related to vomiting, nausea, other (see comment) (refusal to eat) as evidenced by percent weight loss, energy intake < 75% for > 7 days, mild depletion of muscle mass, mild depletion of body fat. -ongoing  GOAL:   Patient will meet greater than or equal to 90% of their needs -unmet  MONITOR:   PO intake, Supplement acceptance, Labs, Weight trends, I & O's, Skin  ASSESSMENT:   70 y.o. female w history of HTN, DM, dementia had recent acute CVA w [facility]. Review of Care Everywhere says patient had multiple acute R pontine infarcts on MRI, likely due to atherosclerosis. Has some intracranial stenosis by CTA. She had fall in hospital and and repeat MRI showed worsening expansion of stroke. She was dc'd on asa/ plavix and statin to [facility]. Then was doing home rehab and was improving until about 2 wks ago started to refuse to eat, then some nausea/ vomiting. They changed to liquids w Ensure and she did that for a while then stopped drinking Ensure, now refuses to eat or drink anything or take pills. Family not sure if she is having dysphagia or not vs just not wanting to eat. PCP did CT scan on 3/30 of chest and abdomen which showed a RLL mass/ abscess appearance. Was put on Augmentin but hasn't taken the last few days. Today labs showed Nicola Girt^creat and pt sent to ED. Asked to see for AKI, n/v and possible lung abscess.   4/11 SLP evaluation since previous visit with diet advanced to Dysphagia 3 with thin liquids. Per chart review, pt ate 25% breakfast, 50% lunch, and refused dinner 4/9; ate a few bites of eggs and oatmeal and refused lunch 4/10. Spoke  with pt's husband, who is at bedside. He states that PTA pt did not have a big appetite and that she would eat a light breakfast and then "nibble" throughout the day. He states that since admission intakes have further declined as pt states feeling full and inability to consume much at any one time. Talked with him about providing soft foods, low fiber options, and separating beverages from meal times. PTA pt was drinking smoothies and was tolerating these well; encouraged continuing this regimen after d/c. No supplements ordered at this time. Husband states pt does not eat ice cream or pudding often. Will trial Ensure BID.   No new weight since previous assessment. Husband reports ongoing dry, nonproductive cough. Pt coughing throughout RD visit. Pt and husband deny exacerbation of this during meal times and deny it interfering with PO intakes. Will continue to monitor.   Pt not meeting needs. Medications reviewed; 5 mg oral Reglan TID, daily multivitamin with minerals, 40 mEq oral KCl every 4 hours. Labs reviewed; CBGs: 94-141 mg/dL, BUN: 5 mg/dL, creatinine elevated and trending up, Ca: 8.7 mg/dL.    4/5 - Pt with dementia and inability to answer questions coherently at time of visit.  - Per chart, pt has experienced a 16% weight loss since 06/24/2015 which is significant for time frame. - Pt has lost 22 lbs since 06/24/15.  - Per chart, barium swallow should be performed if she can cooperate.  - Pt  currently on soft diet.  - Per chart, pt experienced a CVA in January causing left sided hemiparesis causing her to become bound to wheelchair.  - Pt with no family or visitors at bedside.  - Unable to obtain accurate information.  - Intern spoke with RN and student. RN said pt's husband fed her this morning and she did eat some breakfast. RN unsure of what pt ate.  - Pt coughed after eating bites of food. Nursing student stated pt coughing while she gave her meds in applesauce.  - Pt spit up flem  after taking meds this morning.  - Student also reports pt hesitating before swallowing.  - Intern asked RN about liquid and RN reports difficulty swallowing juice.  - Speech has not seen pt yet.  - Will hold off on ordering Ensure because pt seems to be having trouble swallowing liquids.  - NFPE: Mild muscle depletion, mild fat depletion, no edema  Diet Order:  DIET DYS 3 Room service appropriate?: Yes; Fluid consistency:: Thin  Skin:  Reviewed, no issues  Last BM:  4/10  Height:   Ht Readings from Last 1 Encounters:  10/08/15 5' (1.524 m)    Weight:   Wt Readings from Last 1 Encounters:  10/08/15 113 lb (51.256 kg)    Ideal Body Weight:  45 kg  BMI:  Body mass index is 22.07 kg/(m^2).  Estimated Nutritional Needs:   Kcal:  1300-1550 (25-30 kcal/kg)  Protein:  75-85 g (1.5-1.6 g/kg)  Fluid:  1.6-1.8 L  EDUCATION NEEDS:   No education needs identified at this time     Trenton Gammon, RD, LDN Inpatient Clinical Dietitian Pager # [phone] After hours/weekend pager # [phone]

## 2015-10-15 NOTE — Discharge Summary (Signed)
Physician Discharge Summary  Lisa Shaffer:[id number] DOB: 08-28-1945 DOA: 10/08/2015  PCP: [name], DO  Admit date: 10/08/2015 Discharge date: 10/15/2015  Time spent: 65 minutes  Recommendations for Outpatient Follow-up:  1. Follow-up with [name], DO in 1-2 weeks. On follow-up patient will need a blood pressure reassessed. Patient need a basic metabolic profile done to follow-up on electrolytes and renal function. Patient's dysphagia need to be reassessed as patient was placed on a trial of Reglan. 2. Follow-up with [name], pulmonary on 11/07/2015 for further management of lung abscess. 3. Patient will be called by pulmonary office to be set up for an appointment for repeat CT scan of the chest for follow-up on lung abscess.   Discharge Diagnoses:  Principal Problem:   Lung abscess (HCC) Active Problems:   Moderate dementia   Dysphagia   Anorexia   Nausea and vomiting   DM2 (diabetes mellitus, type 2) (HCC)   Volume depletion   AKI (acute kidney injury) (HCC)   Hypertension   History of recent stroke   Left hemiparesis (HCC)   Protein-calorie malnutrition, severe   Discharge Condition: Stable and improved  Diet recommendation: Carb modified diet/dysphagia 3 diet.  Filed Weights   10/08/15 1425  Weight: 51.256 kg (113 lb)    History of present illness:  Per Dr Ventura Sellers is a 70 y.o. female w history of HTN, DM, dementia had recent acute CVA w left hemiparesis managed at [facility] in jan 2017. Review of Care Everywhere says patient had multiple acute R pontine infarcts on MRI, likely due to atherosclerosis. Has some intracranial stenosis by CTA. She had fall in hospital and and repeat MRI showed worsening expansion of stroke. She was dc'd on asa/ plavix and transferred to [facility] for Rehab. Then was doing home rehab and was improving until about 2 wks prior to admission, started to refuse to eat, then some nausea/ vomiting. They changed to liquids w  Ensure and she did that for a while then stopped drinking Ensure, now refused to eat or drink anything or take pills. Family not sure if she is having dysphagia or not vs just not wanting to eat. PCP did CT scan on 3/30 of chest and abdomen which showed a RLL mass/ abscess appearance. Was put on Augmentin but hasn't taken the last few days. lab on day of admission, labs showed Lisa Shaffer and pt sent to ED. Asked to see for AKI, n/v and possible lung abscess.   Hospital Course:  #1 lung abscess Per chest x-ray and per CT chest. Patient remained afebrile during the hospitalization. Patient noted to have a leukocytosis. Patient denied any shortness of breath. Patient has been seen in consultation by pulmonary were recommending 4-6 weeks of antibiotics of Augmentin twice daily with repeat CT imaging at that time and further antibiotics guided by CT findings. Patient was initially placed on IV Zosyn due to her dysphagia and subsequently transitioned to oral Augmentin with her improvement with oral intake. Patient will follow-up with pulmonary as outpatient. Repeat CT scan has been scheduled for patient in the outpatient setting.  #2 dysphagia Questionable etiology. Patient has been seen by gastroenterology patient underwent a barium swallow which is showing no evidence of facial or hernia or other focal esophageal abnormality. Mild esophageal dysmotility, likely presbyesophagus. In discussion with patient's son was noted that after meals which patient had initially tolerated while watching television patient was noted to have nausea and emesis. Patient is also noted to be  a diabetic. Concern for possible diabetic gastroparesis. Patient unable to undergo gastric emptying study. Patient was placed empirically on IV Reglan and was assessed by speech therapy. Patient was placed on a dysphagia 3 diet. Patient tolerated diet did not have any further emesis and will be discharged home on a trial of Reglan. Outpatient  follow-up.  #3 hypokalemia Repleted.  #4 anemia Likely chronic in nature and dilutional. Anemia panel with iron level of 17 TIBC of 186 ferritin of 981. Folate of 10.5. Likely anemia of chronic disease. Patient's hemoglobin remained stable throughout the hospitalization. Outpatient follow-up.   #5 leukocytosis Likely secondary to problem #1. Urinalysis is unremarkable. Patient was initially placed empirically on IV Zosyn due to dysphagia. As dysphagia improved and patient was tolerating oral intake patient was transitioned to oral Augmentin which she tolerated. Patient will be discharged on 6 weeks of oral Augmentin. Patient's leukocytosis had improved by day of discharge.   #6 acute renal failure Likely secondary to volume depletion and possibly contrast induced in the setting of ARB. ARB and diuretics were held. Patient was hydrated with IV fluids. Patient's renal function improved and had resolved by day of discharge.   #7 history of recent pontine CVA/left hemiparesis generated 2017 Continued on aspirin/Plavix for secondary stroke prevention. Patient remained stable throughout the hospitalization was seen by physical therapy. Patient be discharged home with home health PT.  #8 dementia Patient was maintained on a home regimen of Namenda.  #9 diabetes mellitus Oral hypoglycemic agents were held throughout the hospitalization and patient was maintained on a sliding scale insulin. Patient's oral hypoglycemic agents will be resumed on discharge.  #10 depression Continued on home regimen of Zoloft.  #11 hypertension ARB and diuretics were held secondary to acute renal failure. Patient's blood pressure remained stable throughout the hospitalization. Outpatient follow-up.   #12 protein calorie malnuitrition  #13 diarrhea Patient was noted to have a bout of loose stools during the hospitalization. Patient was placed on Florastor. Loose stools resolved by day of discharge.     Procedures:  CT chest/ abdomen/pelvis 10/03/2015  Chest x-ray 10/08/2015  Barium swallow 10/09/2015    Consultations:  Pulmonary: Dr. Jamison NeighborNestor 10/08/2015  Gastroenterology: Dr. Madilyn FiremanHayes 10/09/2015  Discharge Exam: Filed Vitals:   10/15/15 0511 10/15/15 1405  BP: 140/78 144/87  Pulse: 88 99  Temp: 98.1 F (36.7 C) 98.2 F (36.8 C)  Resp: 18 18    General: NAD Cardiovascular: RRR Respiratory: CTAB  Discharge Instructions   Discharge Instructions    Diet Carb Modified    Complete by:  As directed      Discharge instructions    Complete by:  As directed   Follow up with Thomos Lemonsobert Yoo, DO in 1-2 weeks. Follow up with pulmonary as scheduled.     Increase activity slowly    Complete by:  As directed           Current Discharge Medication List    START taking these medications   Details  feeding supplement, ENSURE ENLIVE, (ENSURE ENLIVE) LIQD Take 237 mLs by mouth 2 (two) times daily between meals. Qty: 237 mL, Refills: 12    metoCLOPramide (REGLAN) 5 MG tablet Take 1 tablet (5 mg total) by mouth 4 (four) times daily -  before meals and at bedtime. Qty: 120 tablet, Refills: 0    saccharomyces boulardii (FLORASTOR) 250 MG capsule Take 1 capsule (250 mg total) by mouth 2 (two) times daily.      CONTINUE these medications which have CHANGED  Details  amoxicillin-clavulanate (AUGMENTIN) 875-125 MG tablet Take 1 tablet by mouth every 12 (twelve) hours. Take for 6 weeks. Qty: 84 tablet, Refills: 0      CONTINUE these medications which have NOT CHANGED   Details  aspirin 81 MG tablet Take 81 mg by mouth daily.      atorvastatin (LIPITOR) 40 MG tablet Take 40 mg by mouth daily. Reported on 08/22/2015    clopidogrel (PLAVIX) 75 MG tablet Take 75 mg by mouth daily.    donepezil (ARICEPT) 10 MG tablet Take 1 tablet (10 mg total) by mouth at bedtime. Qty: 90 tablet, Refills: 3   Associated Diagnoses: Moderate dementia, with behavioral disturbance     hydrochlorothiazide (MICROZIDE) 12.5 MG capsule Take 12.5 mg by mouth daily.    memantine (NAMENDA XR) 28 MG CP24 24 hr capsule Take 1 capsule (28 mg total) by mouth daily. Qty: 90 capsule, Refills: 3   Associated Diagnoses: Moderate dementia, with behavioral disturbance    metFORMIN (GLUCOPHAGE) 500 MG tablet Take 1 tablet (500 mg total) by mouth 2 (two) times daily with a meal. Qty: 180 tablet, Refills: 1    Multiple Vitamin (MULTIVITAMIN) tablet Take 1 tablet by mouth daily.      NIFEdipine (PROCARDIA XL/ADALAT-CC) 90 MG 24 hr tablet Take 1 tablet (90 mg total) by mouth daily. Qty: 90 tablet, Refills: 1    QUEtiapine (SEROQUEL) 25 MG tablet TAKE 1/2 TABLET BY MOUTH AT BEDTIME AS NEEDED. INCREASE CONFUSION Qty: 15 tablet, Refills: 5    sertraline (ZOLOFT) 100 MG tablet TAKE 1 TABLET BY MOUTH DAILY Qty: 90 tablet, Refills: 0      STOP taking these medications     furosemide (LASIX) 20 MG tablet      valsartan (DIOVAN) 320 MG tablet        No Known Allergies Follow-up Information    Follow up with CT Scan.   Contact information:   Pulmonary office will call you with an appointment       Follow up with Lawanda Cousins, MD On 11/07/2015.   Specialty:  Pulmonary Disease   Why:  Appt at 1:45 PM to discuss follow up CT results   Contact information:   [address] [place] [address] [phone]       Follow up with [name], DO. Schedule an appointment as soon as possible for a visit in 1 week.   Specialty:  Internal Medicine   Why:  f/u in 1-2 weeks.   Contact information:   [address] [place] [address] [phone]        The results of significant diagnostics from this hospitalization (including imaging, microbiology, ancillary and laboratory) are listed below for reference.    Significant Diagnostic Studies: Dg Chest 2 View  10/08/2015  CLINICAL DATA:  Pulmonary abscess. EXAM: CHEST  2 VIEW COMPARISON:  CT scan of October 03, 2015. Radiograph of October 01, 2015. FINDINGS: The heart size and mediastinal contours are within normal limits. No pneumothorax or pleural effusion is noted. Lingular subsegmental atelectasis or scarring is noted. Rounded abnormality is seen in the right lower lobe consistent with probable pulmonary abscess noted on prior study. It appears to have nearly completely filled with fluid at this time. The visualized skeletal structures are unremarkable. IMPRESSION: Continued presence of rounded abnormality seen in right lower lobe most consistent with pulmonary abscess, as it has filled in with fluid completely since prior exam. Neoplasm or malignancy cannot be excluded. Electronically  Signed   By: Lupita Raider, M.D.   On: 10/08/2015 16:06   Dg Chest 2 View  10/01/2015  CLINICAL DATA:  Recent weight loss, dry cough for 2 weeks, hypertension, diabetes mellitus, dementia, prior stroke EXAM: CHEST  2 VIEW COMPARISON:  None FINDINGS: Normal heart size, mediastinal contours and pulmonary vascularity. Atherosclerotic calcification aorta. Minimal atelectasis at LEFT base. Significant opacity at the RIGHT lower lobe with central lucency likely air. Findings concerning for a cavitary lesion at the RIGHT lower lobe. On lateral view this has a somewhat more well defined anterior margin and a diaphragmatic hernia is not completely excluded. Remaining lungs clear. No definite pleural effusion or pneumothorax. Bones diffusely demineralized. IMPRESSION: RIGHT lower lobe process containing opacity and potential central cavitation, question cavitary mass or infection, diaphragmatic hernia not completely excluded. Further assessment by CT chest with contrast recommended to exclude cavitary process. These results will be called to the ordering clinician or representative by the Radiologist Assistant, and communication documented in the PACS or zVision Dashboard. Electronically Signed   By: Ulyses Southward M.D.   On: 10/01/2015 14:30    Ct Chest W Contrast  10/03/2015  CLINICAL DATA:  Patient with abnormal chest radiograph. Trouble swallowing. Weight loss. EXAM: CT CHEST, ABDOMEN, AND PELVIS WITH CONTRAST TECHNIQUE: Multidetector CT imaging of the chest, abdomen and pelvis was performed following the standard protocol during bolus administration of intravenous contrast. CONTRAST:  80 cc Isovue-300 COMPARISON:  Chest radiograph 10/01/2015 FINDINGS: CT CHEST Mediastinum/Nodes: Visualized thyroid is unremarkable. Heart is normal in size. No pericardial effusion. Aorta and main pulmonary artery normal in caliber. No axillary, mediastinal or hilar lymphadenopathy. Lungs/Pleura: Central airways are patent. There is a 6.6 x 4.8 cm thick walled gas and fluid containing mass within the right lower lobe. There is additional adjacent subpleural consolidation within the right lower lobe (image 29; series 3). Subpleural consolidative opacity demonstrated within the left lower lobe (image 28; series 3). Small right pleural effusion. Musculoskeletal: No aggressive or acute appearing osseous lesions. Multiple old healed left anterior rib fractures. CT ABDOMEN AND PELVIS Hepatobiliary: There are 2 adjacent 9 mm low-attenuation lesions within the peripheral left hepatic lobe (image 53; series 2) and (image 48; series 2). Gallbladder is unremarkable. Additional sub cm low-attenuation lesion hepatic dome (image 34; series 2). Pancreas: Unremarkable Spleen: Unremarkable Adrenals/Urinary Tract: Motion artifact limits evaluation of the adrenal glands. Kidneys are symmetric in size. No hydronephrosis. Too small to characterize low-attenuation lesion inferior pole left kidney. Urinary bladder is unremarkable. Stomach/Bowel: No abnormal bowel wall thickening or evidence for bowel obstruction. No free fluid or free intraperitoneal air. Normal morphology of the stomach. Vascular/Lymphatic: Normal caliber abdominal aorta. Peripheral calcified atherosclerotic plaque. No  retroperitoneal lymphadenopathy. Other: Focal calcification along the posterior margin of the uterus. Adnexal structures are unremarkable. Musculoskeletal: Lumbar spine degenerative changes. No aggressive or acute appearing osseous lesions. IMPRESSION: There is a gas and fluid containing mass within the right lower lobe which may represent a pulmonary abscess. Less likely consideration is cavitary neoplasm. This needs clinical correlation as well as continued imaging followup. Subpleural consolidation within the left and right lower lobes, nonspecific, however potentially representing an infectious process. Recommend attention on followup. These results will be called to the ordering clinician or representative by the Radiologist Assistant, and communication documented in the PACS or zVision Dashboard. Electronically Signed   By: Annia Belt M.D.   On: 10/03/2015 13:59   Ct Abdomen Pelvis W Contrast  10/03/2015  CLINICAL DATA:  Patient with abnormal chest radiograph. Trouble swallowing. Weight loss. EXAM: CT CHEST, ABDOMEN, AND PELVIS WITH CONTRAST TECHNIQUE: Multidetector CT imaging of the chest, abdomen and pelvis was performed following the standard protocol during bolus administration of intravenous contrast. CONTRAST:  80 cc Isovue-300 COMPARISON:  Chest radiograph 10/01/2015 FINDINGS: CT CHEST Mediastinum/Nodes: Visualized thyroid is unremarkable. Heart is normal in size. No pericardial effusion. Aorta and main pulmonary artery normal in caliber. No axillary, mediastinal or hilar lymphadenopathy. Lungs/Pleura: Central airways are patent. There is a 6.6 x 4.8 cm thick walled gas and fluid containing mass within the right lower lobe. There is additional adjacent subpleural consolidation within the right lower lobe (image 29; series 3). Subpleural consolidative opacity demonstrated within the left lower lobe (image 28; series 3). Small right pleural effusion. Musculoskeletal: No aggressive or acute appearing  osseous lesions. Multiple old healed left anterior rib fractures. CT ABDOMEN AND PELVIS Hepatobiliary: There are 2 adjacent 9 mm low-attenuation lesions within the peripheral left hepatic lobe (image 53; series 2) and (image 48; series 2). Gallbladder is unremarkable. Additional sub cm low-attenuation lesion hepatic dome (image 34; series 2). Pancreas: Unremarkable Spleen: Unremarkable Adrenals/Urinary Tract: Motion artifact limits evaluation of the adrenal glands. Kidneys are symmetric in size. No hydronephrosis. Too small to characterize low-attenuation lesion inferior pole left kidney. Urinary bladder is unremarkable. Stomach/Bowel: No abnormal bowel wall thickening or evidence for bowel obstruction. No free fluid or free intraperitoneal air. Normal morphology of the stomach. Vascular/Lymphatic: Normal caliber abdominal aorta. Peripheral calcified atherosclerotic plaque. No retroperitoneal lymphadenopathy. Other: Focal calcification along the posterior margin of the uterus. Adnexal structures are unremarkable. Musculoskeletal: Lumbar spine degenerative changes. No aggressive or acute appearing osseous lesions. IMPRESSION: There is a gas and fluid containing mass within the right lower lobe which may represent a pulmonary abscess. Less likely consideration is cavitary neoplasm. This needs clinical correlation as well as continued imaging followup. Subpleural consolidation within the left and right lower lobes, nonspecific, however potentially representing an infectious process. Recommend attention on followup. These results will be called to the ordering clinician or representative by the Radiologist Assistant, and communication documented in the PACS or zVision Dashboard. Electronically Signed   By: Annia Belt M.D.   On: 10/03/2015 13:59   Dg Esophagus  10/09/2015  CLINICAL DATA:  Nausea vomiting. Evaluate for recurrence of hiatal hernia after anti reflux surgery. EXAM: ESOPHOGRAM/BARIUM SWALLOW TECHNIQUE:  Single contrast examination was performed using  thin barium. FLUOROSCOPY TIME:  Radiation Exposure Index (as provided by the fluoroscopic device): 5.1 mg Y Number of Acquired Images:  None COMPARISON:  CTs of the chest, abdomen, and pelvis dated 10/03/2015. FINDINGS: Secondary to the patient's clinical status, and the clinical question, exam was performed in an LPO position with focused single-contrast technique. Evaluation of individual swallows demonstrates mild esophageal dysmotility. Full column evaluation of the esophagus demonstrates no persistent narrowing or stricture. No hiatal hernia. Normal appearance of the upper/proximal stomach. IMPRESSION: 1. No evidence of hiatal hernia or other focal esophageal abnormality. 2. Mild esophageal dysmotility, likely presbyesophagus. Electronically Signed   By: Jeronimo Greaves M.D.   On: 10/09/2015 12:59    Microbiology: Recent Results (from the past 240 hour(s))  Urine culture     Status: None   Collection Time: 10/08/15  5:32 PM  Result Value Ref Range Status   Specimen Description URINE, CATHETERIZED  Final   Special Requests Normal  Final   Culture   Final    NO GROWTH 1 DAY Performed at [facility]  Hospital    Report Status 10/09/2015 FINAL  Final     Labs: Basic Metabolic Panel:  Recent Labs Lab 10/09/15 0507 10/10/15 0520 10/11/15 1610 10/11/15 0519 10/12/15 0550 10/13/15 0547 10/14/15 0526 10/15/15 0535  NA 138 138  --  144 142 147* 144 145  K 3.0* 5.0  --  3.9 3.5 3.3* 2.9* 4.1  CL 104 107  --  113* 113* 107 107 111  CO2 21* 19*  --  20* 17* GLUCOSE 105* 145*  --  111* 113* 110* 95 97  BUN 30* 19  --  5* 5*  CREATININE 1.49* 1.35*  --  1.26* 1.13* 1.11* 0.95 1.02*  CALCIUM 8.7* 9.1  --  8.8* 8.6* 8.7* 8.4* 8.7*  MG 1.8 1.5* 2.6*  --   --   --   --   --    Liver Function Tests:  Recent Labs Lab 10/09/15 0507  AST 10*  ALT 8*  ALKPHOS 77  BILITOT 0.7  PROT 6.5  ALBUMIN 2.0*   No results for  input(s): LIPASE, AMYLASE in the last 168 hours. No results for input(s): AMMONIA in the last 168 hours. CBC:  Recent Labs Lab 10/10/15 0520 10/11/15 0519 10/12/15 0550 10/13/15 0547 10/14/15 0526  WBC 14.9* 12.1* 10.5 10.9* 9.9  HGB 9.5* 8.8* 8.5* 8.7* 9.0*  HCT 26.7* 25.2* 24.2* 24.7* 26.1*  MCV 73.2* 75.2* 75.2* 73.5* 75.7*  PLT 331 318 338 367 358   Cardiac Enzymes: No results for input(s): CKTOTAL, CKMB, CKMBINDEX, TROPONINI in the last 168 hours. BNP: BNP (last 3 results) No results for input(s): BNP in the last 8760 hours.  ProBNP (last 3 results) No results for input(s): PROBNP in the last 8760 hours.  CBG:  Recent Labs Lab 10/14/15 1129 10/14/15 1641 10/14/15 2130 10/15/15 0736 10/15/15 1137  GLUCAP 141* 94 100* 101* 151*       Signed:  Mylissa Lambe MD.  [facility] 10/15/2015, 4:41 PM

## 2015-10-16 ENCOUNTER — Telehealth: Payer: Self-pay | Admitting: *Deleted

## 2015-10-16 NOTE — Telephone Encounter (Signed)
1st attempt.  Transitional Care Management.  No answer at home number.  No answer on cell number.

## 2015-10-17 NOTE — Telephone Encounter (Signed)
2 nd attempt.  Transitional Care Management.  I spoke with the son and he will have his dad to call back to schedule an appointment.

## 2015-10-21 DIAGNOSIS — Z7902 Long term (current) use of antithrombotics/antiplatelets: Secondary | ICD-10-CM | POA: Diagnosis not present

## 2015-10-21 DIAGNOSIS — Z7982 Long term (current) use of aspirin: Secondary | ICD-10-CM | POA: Diagnosis not present

## 2015-10-21 DIAGNOSIS — E119 Type 2 diabetes mellitus without complications: Secondary | ICD-10-CM | POA: Diagnosis not present

## 2015-10-21 DIAGNOSIS — E213 Hyperparathyroidism, unspecified: Secondary | ICD-10-CM | POA: Diagnosis not present

## 2015-10-21 DIAGNOSIS — R1310 Dysphagia, unspecified: Secondary | ICD-10-CM | POA: Diagnosis not present

## 2015-10-21 DIAGNOSIS — R918 Other nonspecific abnormal finding of lung field: Secondary | ICD-10-CM | POA: Diagnosis not present

## 2015-10-21 DIAGNOSIS — E876 Hypokalemia: Secondary | ICD-10-CM | POA: Diagnosis not present

## 2015-10-21 DIAGNOSIS — D649 Anemia, unspecified: Secondary | ICD-10-CM | POA: Diagnosis not present

## 2015-10-21 DIAGNOSIS — I10 Essential (primary) hypertension: Secondary | ICD-10-CM | POA: Diagnosis not present

## 2015-10-21 DIAGNOSIS — Z7984 Long term (current) use of oral hypoglycemic drugs: Secondary | ICD-10-CM | POA: Diagnosis not present

## 2015-10-21 DIAGNOSIS — E43 Unspecified severe protein-calorie malnutrition: Secondary | ICD-10-CM | POA: Diagnosis not present

## 2015-10-21 DIAGNOSIS — I69354 Hemiplegia and hemiparesis following cerebral infarction affecting left non-dominant side: Secondary | ICD-10-CM | POA: Diagnosis not present

## 2015-10-25 ENCOUNTER — Ambulatory Visit (INDEPENDENT_AMBULATORY_CARE_PROVIDER_SITE_OTHER)
Admission: RE | Admit: 2015-10-25 | Discharge: 2015-10-25 | Disposition: A | Discharge status: Home / Self Care | Payer: Medicare Other | Source: Ambulatory Visit | Attending: Pulmonary Disease | Admitting: Pulmonary Disease

## 2015-10-25 DIAGNOSIS — J984 Other disorders of lung: Secondary | ICD-10-CM | POA: Diagnosis not present

## 2015-10-25 DIAGNOSIS — J189 Pneumonia, unspecified organism: Secondary | ICD-10-CM | POA: Diagnosis not present

## 2015-10-30 DIAGNOSIS — Z7982 Long term (current) use of aspirin: Secondary | ICD-10-CM | POA: Diagnosis not present

## 2015-10-30 DIAGNOSIS — E213 Hyperparathyroidism, unspecified: Secondary | ICD-10-CM | POA: Diagnosis not present

## 2015-10-30 DIAGNOSIS — D649 Anemia, unspecified: Secondary | ICD-10-CM | POA: Diagnosis not present

## 2015-10-30 DIAGNOSIS — Z7902 Long term (current) use of antithrombotics/antiplatelets: Secondary | ICD-10-CM | POA: Diagnosis not present

## 2015-10-30 DIAGNOSIS — I69354 Hemiplegia and hemiparesis following cerebral infarction affecting left non-dominant side: Secondary | ICD-10-CM | POA: Diagnosis not present

## 2015-10-30 DIAGNOSIS — Z7409 Other reduced mobility: Secondary | ICD-10-CM | POA: Diagnosis not present

## 2015-10-30 DIAGNOSIS — R918 Other nonspecific abnormal finding of lung field: Secondary | ICD-10-CM | POA: Diagnosis not present

## 2015-10-30 DIAGNOSIS — R1310 Dysphagia, unspecified: Secondary | ICD-10-CM | POA: Diagnosis not present

## 2015-10-30 DIAGNOSIS — Z7984 Long term (current) use of oral hypoglycemic drugs: Secondary | ICD-10-CM | POA: Diagnosis not present

## 2015-10-30 DIAGNOSIS — I10 Essential (primary) hypertension: Secondary | ICD-10-CM | POA: Diagnosis not present

## 2015-10-30 DIAGNOSIS — E876 Hypokalemia: Secondary | ICD-10-CM | POA: Diagnosis not present

## 2015-10-30 DIAGNOSIS — E43 Unspecified severe protein-calorie malnutrition: Secondary | ICD-10-CM | POA: Diagnosis not present

## 2015-10-30 DIAGNOSIS — E119 Type 2 diabetes mellitus without complications: Secondary | ICD-10-CM | POA: Diagnosis not present

## 2015-10-31 ENCOUNTER — Institutional Professional Consult (permissible substitution): Payer: Medicare Other | Admitting: Pulmonary Disease

## 2015-10-31 ENCOUNTER — Other Ambulatory Visit: Payer: Self-pay | Admitting: Internal Medicine

## 2015-10-31 DIAGNOSIS — E876 Hypokalemia: Secondary | ICD-10-CM | POA: Diagnosis not present

## 2015-10-31 DIAGNOSIS — Z7902 Long term (current) use of antithrombotics/antiplatelets: Secondary | ICD-10-CM | POA: Diagnosis not present

## 2015-10-31 DIAGNOSIS — E119 Type 2 diabetes mellitus without complications: Secondary | ICD-10-CM | POA: Diagnosis not present

## 2015-10-31 DIAGNOSIS — I69354 Hemiplegia and hemiparesis following cerebral infarction affecting left non-dominant side: Secondary | ICD-10-CM | POA: Diagnosis not present

## 2015-10-31 DIAGNOSIS — Z7982 Long term (current) use of aspirin: Secondary | ICD-10-CM | POA: Diagnosis not present

## 2015-10-31 DIAGNOSIS — Z7984 Long term (current) use of oral hypoglycemic drugs: Secondary | ICD-10-CM | POA: Diagnosis not present

## 2015-10-31 DIAGNOSIS — R1310 Dysphagia, unspecified: Secondary | ICD-10-CM | POA: Diagnosis not present

## 2015-10-31 DIAGNOSIS — E43 Unspecified severe protein-calorie malnutrition: Secondary | ICD-10-CM | POA: Diagnosis not present

## 2015-10-31 DIAGNOSIS — R918 Other nonspecific abnormal finding of lung field: Secondary | ICD-10-CM | POA: Diagnosis not present

## 2015-10-31 DIAGNOSIS — I10 Essential (primary) hypertension: Secondary | ICD-10-CM | POA: Diagnosis not present

## 2015-10-31 DIAGNOSIS — E213 Hyperparathyroidism, unspecified: Secondary | ICD-10-CM | POA: Diagnosis not present

## 2015-10-31 DIAGNOSIS — D649 Anemia, unspecified: Secondary | ICD-10-CM | POA: Diagnosis not present

## 2015-11-01 DIAGNOSIS — R1310 Dysphagia, unspecified: Secondary | ICD-10-CM | POA: Diagnosis not present

## 2015-11-01 DIAGNOSIS — I10 Essential (primary) hypertension: Secondary | ICD-10-CM | POA: Diagnosis not present

## 2015-11-01 DIAGNOSIS — R918 Other nonspecific abnormal finding of lung field: Secondary | ICD-10-CM | POA: Diagnosis not present

## 2015-11-01 DIAGNOSIS — E876 Hypokalemia: Secondary | ICD-10-CM | POA: Diagnosis not present

## 2015-11-01 DIAGNOSIS — E119 Type 2 diabetes mellitus without complications: Secondary | ICD-10-CM | POA: Diagnosis not present

## 2015-11-01 DIAGNOSIS — Z7902 Long term (current) use of antithrombotics/antiplatelets: Secondary | ICD-10-CM | POA: Diagnosis not present

## 2015-11-01 DIAGNOSIS — E43 Unspecified severe protein-calorie malnutrition: Secondary | ICD-10-CM | POA: Diagnosis not present

## 2015-11-01 DIAGNOSIS — D649 Anemia, unspecified: Secondary | ICD-10-CM | POA: Diagnosis not present

## 2015-11-01 DIAGNOSIS — Z7982 Long term (current) use of aspirin: Secondary | ICD-10-CM | POA: Diagnosis not present

## 2015-11-01 DIAGNOSIS — E213 Hyperparathyroidism, unspecified: Secondary | ICD-10-CM | POA: Diagnosis not present

## 2015-11-01 DIAGNOSIS — I69354 Hemiplegia and hemiparesis following cerebral infarction affecting left non-dominant side: Secondary | ICD-10-CM | POA: Diagnosis not present

## 2015-11-01 DIAGNOSIS — Z7984 Long term (current) use of oral hypoglycemic drugs: Secondary | ICD-10-CM | POA: Diagnosis not present

## 2015-11-06 DIAGNOSIS — I69354 Hemiplegia and hemiparesis following cerebral infarction affecting left non-dominant side: Secondary | ICD-10-CM | POA: Diagnosis not present

## 2015-11-06 DIAGNOSIS — E876 Hypokalemia: Secondary | ICD-10-CM | POA: Diagnosis not present

## 2015-11-06 DIAGNOSIS — Z7982 Long term (current) use of aspirin: Secondary | ICD-10-CM | POA: Diagnosis not present

## 2015-11-06 DIAGNOSIS — R918 Other nonspecific abnormal finding of lung field: Secondary | ICD-10-CM | POA: Diagnosis not present

## 2015-11-06 DIAGNOSIS — E119 Type 2 diabetes mellitus without complications: Secondary | ICD-10-CM | POA: Diagnosis not present

## 2015-11-06 DIAGNOSIS — Z7984 Long term (current) use of oral hypoglycemic drugs: Secondary | ICD-10-CM | POA: Diagnosis not present

## 2015-11-06 DIAGNOSIS — R1310 Dysphagia, unspecified: Secondary | ICD-10-CM | POA: Diagnosis not present

## 2015-11-06 DIAGNOSIS — Z7902 Long term (current) use of antithrombotics/antiplatelets: Secondary | ICD-10-CM | POA: Diagnosis not present

## 2015-11-06 DIAGNOSIS — E213 Hyperparathyroidism, unspecified: Secondary | ICD-10-CM | POA: Diagnosis not present

## 2015-11-06 DIAGNOSIS — E43 Unspecified severe protein-calorie malnutrition: Secondary | ICD-10-CM | POA: Diagnosis not present

## 2015-11-06 DIAGNOSIS — D649 Anemia, unspecified: Secondary | ICD-10-CM | POA: Diagnosis not present

## 2015-11-06 DIAGNOSIS — I10 Essential (primary) hypertension: Secondary | ICD-10-CM | POA: Diagnosis not present

## 2015-11-07 ENCOUNTER — Encounter: Payer: Self-pay | Admitting: Pulmonary Disease

## 2015-11-07 ENCOUNTER — Ambulatory Visit (INDEPENDENT_AMBULATORY_CARE_PROVIDER_SITE_OTHER): Payer: Medicare Other | Admitting: Pulmonary Disease

## 2015-11-07 VITALS — BP 92/66 | HR 89 | Ht 60.0 in

## 2015-11-07 DIAGNOSIS — R1310 Dysphagia, unspecified: Secondary | ICD-10-CM | POA: Diagnosis not present

## 2015-11-07 DIAGNOSIS — J852 Abscess of lung without pneumonia: Secondary | ICD-10-CM
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
## 2015-11-07 NOTE — Patient Instructions (Signed)
   Keep taking your antibiotic (Augmentin). This should continue for another 2 weeks.  Call me if you have any new breathing problems, fever, or develop a productive cough.  I will see you back in 6 weeks with a CT scan at that time to make sure your lung abscess is getting better.  TESTS ORDERED: 1. CT chest without contrast 6 weeks

## 2015-11-12 DIAGNOSIS — I69354 Hemiplegia and hemiparesis following cerebral infarction affecting left non-dominant side: Secondary | ICD-10-CM | POA: Diagnosis not present

## 2015-11-12 DIAGNOSIS — E213 Hyperparathyroidism, unspecified: Secondary | ICD-10-CM | POA: Diagnosis not present

## 2015-11-12 DIAGNOSIS — D649 Anemia, unspecified: Secondary | ICD-10-CM | POA: Diagnosis not present

## 2015-11-12 DIAGNOSIS — Z7982 Long term (current) use of aspirin: Secondary | ICD-10-CM | POA: Diagnosis not present

## 2015-11-12 DIAGNOSIS — E119 Type 2 diabetes mellitus without complications: Secondary | ICD-10-CM | POA: Diagnosis not present

## 2015-11-12 DIAGNOSIS — I10 Essential (primary) hypertension: Secondary | ICD-10-CM | POA: Diagnosis not present

## 2015-11-12 DIAGNOSIS — R1310 Dysphagia, unspecified: Secondary | ICD-10-CM | POA: Diagnosis not present

## 2015-11-12 DIAGNOSIS — Z7984 Long term (current) use of oral hypoglycemic drugs: Secondary | ICD-10-CM | POA: Diagnosis not present

## 2015-11-12 DIAGNOSIS — E876 Hypokalemia: Secondary | ICD-10-CM | POA: Diagnosis not present

## 2015-11-12 DIAGNOSIS — R918 Other nonspecific abnormal finding of lung field: Secondary | ICD-10-CM | POA: Diagnosis not present

## 2015-11-12 DIAGNOSIS — E43 Unspecified severe protein-calorie malnutrition: Secondary | ICD-10-CM | POA: Diagnosis not present

## 2015-11-12 DIAGNOSIS — Z7902 Long term (current) use of antithrombotics/antiplatelets: Secondary | ICD-10-CM | POA: Diagnosis not present

## 2015-11-13 DIAGNOSIS — Z7982 Long term (current) use of aspirin: Secondary | ICD-10-CM | POA: Diagnosis not present

## 2015-11-13 DIAGNOSIS — R531 Weakness: Secondary | ICD-10-CM | POA: Diagnosis not present

## 2015-11-13 DIAGNOSIS — I69354 Hemiplegia and hemiparesis following cerebral infarction affecting left non-dominant side: Secondary | ICD-10-CM | POA: Diagnosis not present

## 2015-11-13 DIAGNOSIS — I10 Essential (primary) hypertension: Secondary | ICD-10-CM | POA: Diagnosis not present

## 2015-11-13 DIAGNOSIS — R29818 Other symptoms and signs involving the nervous system: Secondary | ICD-10-CM | POA: Diagnosis not present

## 2015-11-13 DIAGNOSIS — I6350 Cerebral infarction due to unspecified occlusion or stenosis of unspecified cerebral artery: Secondary | ICD-10-CM | POA: Diagnosis not present

## 2015-11-14 DIAGNOSIS — Z7982 Long term (current) use of aspirin: Secondary | ICD-10-CM | POA: Diagnosis not present

## 2015-11-14 DIAGNOSIS — Z7984 Long term (current) use of oral hypoglycemic drugs: Secondary | ICD-10-CM | POA: Diagnosis not present

## 2015-11-14 DIAGNOSIS — I10 Essential (primary) hypertension: Secondary | ICD-10-CM | POA: Diagnosis not present

## 2015-11-14 DIAGNOSIS — E213 Hyperparathyroidism, unspecified: Secondary | ICD-10-CM | POA: Diagnosis not present

## 2015-11-14 DIAGNOSIS — E876 Hypokalemia: Secondary | ICD-10-CM | POA: Diagnosis not present

## 2015-11-14 DIAGNOSIS — E43 Unspecified severe protein-calorie malnutrition: Secondary | ICD-10-CM | POA: Diagnosis not present

## 2015-11-14 DIAGNOSIS — Z7902 Long term (current) use of antithrombotics/antiplatelets: Secondary | ICD-10-CM | POA: Diagnosis not present

## 2015-11-14 DIAGNOSIS — D649 Anemia, unspecified: Secondary | ICD-10-CM | POA: Diagnosis not present

## 2015-11-14 DIAGNOSIS — R1310 Dysphagia, unspecified: Secondary | ICD-10-CM | POA: Diagnosis not present

## 2015-11-14 DIAGNOSIS — R918 Other nonspecific abnormal finding of lung field: Secondary | ICD-10-CM | POA: Diagnosis not present

## 2015-11-14 DIAGNOSIS — I69354 Hemiplegia and hemiparesis following cerebral infarction affecting left non-dominant side: Secondary | ICD-10-CM | POA: Diagnosis not present

## 2015-11-14 DIAGNOSIS — E119 Type 2 diabetes mellitus without complications: Secondary | ICD-10-CM | POA: Diagnosis not present

## 2015-11-19 DIAGNOSIS — R918 Other nonspecific abnormal finding of lung field: Secondary | ICD-10-CM | POA: Diagnosis not present

## 2015-11-19 DIAGNOSIS — E119 Type 2 diabetes mellitus without complications: Secondary | ICD-10-CM | POA: Diagnosis not present

## 2015-11-19 DIAGNOSIS — E213 Hyperparathyroidism, unspecified: Secondary | ICD-10-CM | POA: Diagnosis not present

## 2015-11-19 DIAGNOSIS — Z7982 Long term (current) use of aspirin: Secondary | ICD-10-CM | POA: Diagnosis not present

## 2015-11-19 DIAGNOSIS — R1310 Dysphagia, unspecified: Secondary | ICD-10-CM | POA: Diagnosis not present

## 2015-11-19 DIAGNOSIS — Z7902 Long term (current) use of antithrombotics/antiplatelets: Secondary | ICD-10-CM | POA: Diagnosis not present

## 2015-11-19 DIAGNOSIS — I69354 Hemiplegia and hemiparesis following cerebral infarction affecting left non-dominant side: Secondary | ICD-10-CM | POA: Diagnosis not present

## 2015-11-19 DIAGNOSIS — E876 Hypokalemia: Secondary | ICD-10-CM | POA: Diagnosis not present

## 2015-11-19 DIAGNOSIS — I10 Essential (primary) hypertension: Secondary | ICD-10-CM | POA: Diagnosis not present

## 2015-11-19 DIAGNOSIS — D649 Anemia, unspecified: Secondary | ICD-10-CM | POA: Diagnosis not present

## 2015-11-19 DIAGNOSIS — E43 Unspecified severe protein-calorie malnutrition: Secondary | ICD-10-CM | POA: Diagnosis not present

## 2015-11-19 DIAGNOSIS — Z7984 Long term (current) use of oral hypoglycemic drugs: Secondary | ICD-10-CM | POA: Diagnosis not present

## 2015-11-21 ENCOUNTER — Telehealth: Payer: Self-pay | Admitting: Pulmonary Disease

## 2015-11-21 MED ORDER — PREDNISONE 10 MG PO TABS: 10.0000 mg | ORAL_TABLET | Freq: Every day | ORAL | Status: DC

## 2015-11-21 NOTE — Telephone Encounter (Signed)
Spoke with pt's husband. Pt c/o decreased appetite and possible increased weight loss. Denies n or v, sob or cough.  Pt doesn't like drinking Ensure.  Pt on last week of Augmentin for a total of 6 weeks.  They have discussed this with Dr Jamison NeighborNestor.  They have not spoken with Dr Caryl NeverBurchette about symptoms yet.  Please advise of any recommendations.  Sending to doc of day.  No Known Allergies  Current Outpatient Prescriptions on File Prior to Visit  Medication Sig Dispense Refill  . amoxicillin-clavulanate (AUGMENTIN) 875-125 MG tablet Take 1 tablet by mouth every 12 (twelve) hours. Take for 6 weeks. 84 tablet 0  . aspirin 81 MG tablet Take 81 mg by mouth daily.      Marland Kitchen. atorvastatin (LIPITOR) 40 MG tablet Take 40 mg by mouth daily. Reported on 08/22/2015    . clopidogrel (PLAVIX) 75 MG tablet Take 75 mg by mouth daily.    Marland Kitchen. donepezil (ARICEPT) 10 MG tablet Take 1 tablet (10 mg total) by mouth at bedtime. 90 tablet 3  . feeding supplement, ENSURE ENLIVE, (ENSURE ENLIVE) LIQD Take 237 mLs by mouth 2 (two) times daily between meals. 237 mL 12  . hydrochlorothiazide (MICROZIDE) 12.5 MG capsule Take 12.5 mg by mouth daily.    . memantine (NAMENDA XR) 28 MG CP24 24 hr capsule Take 1 capsule (28 mg total) by mouth daily. 90 capsule 3  . metFORMIN (GLUCOPHAGE) 500 MG tablet TAKE 1 TABLET(500 MG) BY MOUTH TWICE DAILY WITH A MEAL 180 tablet 0  . metoCLOPramide (REGLAN) 5 MG tablet Take 1 tablet (5 mg total) by mouth 4 (four) times daily -  before meals and at bedtime. 120 tablet 0  . Multiple Vitamin (MULTIVITAMIN) tablet Take 1 tablet by mouth daily.      Marland Kitchen. NIFEdipine (PROCARDIA XL/ADALAT-CC) 90 MG 24 hr tablet Take 1 tablet (90 mg total) by mouth daily. 90 tablet 1  . QUEtiapine (SEROQUEL) 25 MG tablet TAKE 1/2 TABLET BY MOUTH AT BEDTIME AS NEEDED. INCREASE CONFUSION 15 tablet 5  . saccharomyces boulardii (FLORASTOR) 250 MG capsule Take 1 capsule (250 mg total) by mouth 2 (two) times daily.    . sertraline  (ZOLOFT) 100 MG tablet TAKE 1 TABLET BY MOUTH DAILY 90 tablet 0   No current facility-administered medications on file prior to visit.

## 2015-11-21 NOTE — Telephone Encounter (Signed)
Appetite problems will need to be evaluated by PCP eventually.  For now try prednisone 10 mg, 1 daily, # 14, no refill  Prednisone will raise blood sugar level some, but may be worth a trial as an appetite booster.   She may like Boost as a nutritional supplement, rather than Ensure.  Follow-up with Dr Jamison NeighborNestor as planned

## 2015-11-21 NOTE — Telephone Encounter (Signed)
Spoke with pt's husband and advised of Dr Roxy CedarYoung's recommendations.   He verbalized understanding.  Rx sent to pharmacy.

## 2015-11-29 DIAGNOSIS — Z7409 Other reduced mobility: Secondary | ICD-10-CM | POA: Diagnosis not present

## 2015-12-13 ENCOUNTER — Telehealth: Payer: Self-pay | Admitting: Neurology

## 2015-12-13 NOTE — Telephone Encounter (Signed)
Should patient be seen sooner by another provider?

## 2015-12-13 NOTE — Telephone Encounter (Signed)
Spoke with patient's husband - he explains no neurologic symptoms. Says patient has slowly been eating less since she was discharged from the hospital after her stroke. He states she will consume liquids, but will not hardly eat (or drink boost) and when she does she has episodes of vomiting about 5-10 minutes later. They saw Dr. Caryl NeverBurchette for this back in late March/early April. She had a CT abd/pel and gastric emptying study. It only revealed poss pulmonary abscess vs neoplasm. She has been on antibitoics and has follow up CT scheduled on 12/19/15 and appt with Pulmonology on 12/23/15. I advised it sounds like she needs to see Dr. Caryl NeverBurchette back since episodes of eating/digestion are getting worse. He explains to symptoms that seem related to a neurologic condition. I did tell him would would keep his wife on the waiting list for Dr. Karel JarvisAquino and move her sooner if any appts open up. He will call back if needed.

## 2015-12-13 NOTE — Telephone Encounter (Signed)
Lisa CritchleyMarsha Stangeland 12/29/1945. Her husband called this morning. He was wanting to get an appointment scheduled for his wife to see Dr. Karel JarvisAquino. She was last seen in 06/2015. She has had a stroke since she was last seen. He is concerned that she is not wanting to eat. She does have an appointment scheduled for August to see Dr. Karel JarvisAquino. His number is [phone]. Thank you

## 2015-12-13 NOTE — Telephone Encounter (Signed)
Dr. Karel JarvisAquino never treated her for stroke.  Patient was seen by Dr. Karel JarvisAquino for dementia, which was almost a year ago.  It appears that the stroke occurred in January 2017 and she has [facility].  She has multiple medical problems that could be contributing to lack of appetite.  She should remain on Dr. Rosalyn GessAquino's waitlist.  He should address lack of appetite to her PCP.

## 2015-12-14 ENCOUNTER — Other Ambulatory Visit: Payer: Self-pay | Admitting: Internal Medicine

## 2015-12-16 ENCOUNTER — Ambulatory Visit (INDEPENDENT_AMBULATORY_CARE_PROVIDER_SITE_OTHER): Payer: Medicare Other | Admitting: Family Medicine

## 2015-12-16 VITALS — BP 100/70 | HR 92 | Temp 97.8°F | Ht 60.0 in | Wt 96.0 lb

## 2015-12-16 DIAGNOSIS — F0390 Unspecified dementia without behavioral disturbance: Secondary | ICD-10-CM

## 2015-12-16 DIAGNOSIS — F03B0 Unspecified dementia, moderate, without behavioral disturbance, psychotic disturbance, mood disturbance, and anxiety: Secondary | ICD-10-CM

## 2015-12-16 DIAGNOSIS — R634 Abnormal weight loss: Secondary | ICD-10-CM

## 2015-12-16 MED ORDER — MEGESTROL ACETATE 40 MG/ML PO SUSP: 200.0000 mg | Freq: Every day | ORAL | Status: DC

## 2015-12-16 NOTE — Telephone Encounter (Signed)
Pt has an appt today at 2:30 with Dr. Caryl NeverBurchette. Please ask about refill request for Zoloft. Thanks

## 2015-12-16 NOTE — Telephone Encounter (Signed)
Will refill at visit today.
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
## 2015-12-16 NOTE — Progress Notes (Signed)
Pre visit review using our clinic review tool, if applicable. No additional management support is needed unless otherwise documented below in the visit note. 

## 2015-12-19 ENCOUNTER — Ambulatory Visit (INDEPENDENT_AMBULATORY_CARE_PROVIDER_SITE_OTHER)
Admission: RE | Admit: 2015-12-19 | Discharge: 2015-12-19 | Disposition: A | Discharge status: Home / Self Care | Payer: Medicare Other | Source: Ambulatory Visit | Attending: Pulmonary Disease | Admitting: Pulmonary Disease

## 2015-12-19 DIAGNOSIS — J852 Abscess of lung without pneumonia: Secondary | ICD-10-CM | POA: Diagnosis not present

## 2015-12-23 ENCOUNTER — Encounter: Payer: Self-pay | Admitting: Pulmonary Disease

## 2015-12-23 ENCOUNTER — Ambulatory Visit (INDEPENDENT_AMBULATORY_CARE_PROVIDER_SITE_OTHER): Payer: Medicare Other | Admitting: Pulmonary Disease

## 2015-12-23 VITALS — BP 110/70 | HR 112 | Temp 98.3°F | Ht 65.0 in | Wt 93.8 lb

## 2015-12-23 DIAGNOSIS — R1310 Dysphagia, unspecified: Secondary | ICD-10-CM

## 2015-12-23 DIAGNOSIS — R918 Other nonspecific abnormal finding of lung field: Secondary | ICD-10-CM | POA: Diagnosis not present

## 2015-12-23 NOTE — Patient Instructions (Signed)
   We will schedule you for a bronchoscopy and culture this week.  Please remember to note eat or drink anything for 6 hours prior to the procedure.  Call me if you have any questions. We will make an appointment back to see me in 4 weeks.
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
## 2015-12-27 ENCOUNTER — Ambulatory Visit (HOSPITAL_COMMUNITY)
Admission: RE | Admit: 2015-12-27 | Discharge: 2015-12-27 | Disposition: A | Discharge status: Home / Self Care | Payer: Medicare Other | Source: Ambulatory Visit | Attending: Pulmonary Disease | Admitting: Pulmonary Disease

## 2015-12-27 ENCOUNTER — Encounter (HOSPITAL_COMMUNITY)
Admission: RE | Disposition: A | Discharge status: Home / Self Care | Payer: Self-pay | Source: Ambulatory Visit | Attending: Pulmonary Disease

## 2015-12-27 DIAGNOSIS — R911 Solitary pulmonary nodule: Secondary | ICD-10-CM | POA: Insufficient documentation

## 2015-12-27 DIAGNOSIS — E785 Hyperlipidemia, unspecified: Secondary | ICD-10-CM | POA: Insufficient documentation

## 2015-12-27 DIAGNOSIS — Z7902 Long term (current) use of antithrombotics/antiplatelets: Secondary | ICD-10-CM | POA: Insufficient documentation

## 2015-12-27 DIAGNOSIS — Z79899 Other long term (current) drug therapy: Secondary | ICD-10-CM | POA: Insufficient documentation

## 2015-12-27 DIAGNOSIS — Z7982 Long term (current) use of aspirin: Secondary | ICD-10-CM | POA: Diagnosis not present

## 2015-12-27 DIAGNOSIS — R1310 Dysphagia, unspecified: Secondary | ICD-10-CM | POA: Insufficient documentation

## 2015-12-27 DIAGNOSIS — R848 Other abnormal findings in specimens from respiratory organs and thorax: Secondary | ICD-10-CM | POA: Diagnosis not present

## 2015-12-27 DIAGNOSIS — Z7952 Long term (current) use of systemic steroids: Secondary | ICD-10-CM | POA: Diagnosis not present

## 2015-12-27 DIAGNOSIS — I69391 Dysphagia following cerebral infarction: Secondary | ICD-10-CM | POA: Diagnosis not present

## 2015-12-27 DIAGNOSIS — E119 Type 2 diabetes mellitus without complications: Secondary | ICD-10-CM | POA: Diagnosis not present

## 2015-12-27 DIAGNOSIS — I10 Essential (primary) hypertension: Secondary | ICD-10-CM | POA: Diagnosis not present

## 2015-12-27 DIAGNOSIS — F0390 Unspecified dementia without behavioral disturbance: Secondary | ICD-10-CM | POA: Diagnosis not present

## 2015-12-27 DIAGNOSIS — R918 Other nonspecific abnormal finding of lung field: Secondary | ICD-10-CM | POA: Insufficient documentation

## 2015-12-27 HISTORY — PX: VIDEO BRONCHOSCOPY: SHX5072

## 2015-12-27 LAB — BODY FLUID CELL COUNT WITH DIFFERENTIAL
EOS FL: 1 %
Lymphs, Fluid: 19 %
MONOCYTE-MACROPHAGE-SEROUS FLUID: 77 % (ref 50–90)
Neutrophil Count, Fluid: 3 % (ref 0–25)
WBC FLUID: 17 uL (ref 0–1000)

## 2015-12-27 SURGERY — VIDEO BRONCHOSCOPY WITHOUT FLUORO
Anesthesia: Moderate Sedation | Laterality: Bilateral

## 2015-12-27 MED ORDER — MIDAZOLAM HCL 10 MG/2ML IJ SOLN
INTRAMUSCULAR | Status: DC | PRN
Start: 2015-12-27 — End: 2015-12-27
  Administered 2015-12-27: 1 mg via INTRAVENOUS

## 2015-12-27 MED ORDER — SODIUM CHLORIDE 0.9 % IV SOLN
INTRAVENOUS | Status: DC
  Administered 2015-12-27: 07:00:00 via INTRAVENOUS

## 2015-12-27 MED ORDER — LIDOCAINE HCL 1 % IJ SOLN
INTRAMUSCULAR | Status: DC | PRN
  Administered 2015-12-27: 6 mL

## 2015-12-27 MED ORDER — FENTANYL CITRATE (PF) 100 MCG/2ML IJ SOLN
INTRAMUSCULAR | Status: AC
  Filled 2015-12-27: qty 4

## 2015-12-27 MED ORDER — FENTANYL CITRATE (PF) 100 MCG/2ML IJ SOLN
INTRAMUSCULAR | Status: DC | PRN
  Administered 2015-12-27 (×2): 25 ug via INTRAVENOUS

## 2015-12-27 MED ORDER — SODIUM CHLORIDE 0.9 % IV SOLN: Freq: Once | INTRAVENOUS | Status: DC

## 2015-12-27 MED ORDER — MIDAZOLAM HCL 5 MG/ML IJ SOLN
INTRAMUSCULAR | Status: AC
  Filled 2015-12-27: qty 2

## 2015-12-27 NOTE — Interval H&P Note (Signed)
History and Physical Interval Note:  12/27/2015 7:23 AM  Lisa Shaffer  has presented today for surgery, with the diagnosis of Opacity  The various methods of treatment have been discussed with the patient and family. After consideration of risks, benefits and other options for treatment, the patient has consented to  Procedure(s): VIDEO BRONCHOSCOPY WITHOUT FLUORO (Bilateral) as a surgical intervention .  The patient's history has been reviewed, patient examined, no change in status, stable for surgery.  I have reviewed the patient's chart and labs.  Questions were answered to the patient's satisfaction.     Donna ChristenJennings E. Jamison NeighborNestor, M.D. [facility] Pager:  [phone] After 3pm or if no response, call [phone] 7:23 AM 12/27/2015

## 2015-12-27 NOTE — Op Note (Signed)
Video Bronchoscopy Procedure Note  Pre-Procedure Diagnoses: 1.  Left Lower Lobe Lung Opacity  Procedures Performed: 1. Bronchoscopy with Airway Inspection 2. Bronchoalveolar Lavage Left Lower Lobe  Consent:  Informed consent was obtained after discussing the risks and benefits of the procedure including bleeding, infection, pneumothorax, medication allergy, vocal chord injury, and potentially death.  Conscious Sedation:   Time of First Medication Administration: 7:27 AM Time Physician Left Room: 7:45 AM  Medications Administered During Conscious Sedation: 1. Lidocaine 1% Nebulized pre-procedure 2. Lidocaine 1% 12cc via bronchoscope 3. Versed 1mg  IV 4. Fentanyl 50mcg IV  Pre-Procedure Physical Exam: General:  No acute distress. Awake. Alert. ASA Class2. HEENT:  Moist mucus membranes. No oral ulcers. Mallampati Class 3. Cardiovascular:  Regular rate. No edema. No appreciable JVD. Pulmonary:  Clear to auscultation with good aeration bilaterally. Normal work of breathing. Abdomen:  Soft. Nontender. Nondistended. Normal bowel sounds. Musculoskeletal:  Normal bulk. Normal neck flexion & extension. Neurological:  Oriented to person and place. No change in neurological exam.  Description of Procedure: Patient was brought back to the endoscopy procedure room.  A time out was performed to identify the correct patient and procedure.  Lidocaine was administered via nebulizer preprocedure.  Patient was laid recumbent and conscious sedation was administered by respiratory therapist under my direction.  Bite block was inserted and towel was placed over the patient's eyes.  Flexible bronchoscope was then inserted into the posterior pharynx until vocal chords were in full view. There was no abnormality of the vocal chords or arytenoids.  A total of 6cc of Lidocaine was used to anesthetize the vocal chords.  The bronchoscope was then inserted between the vocal chords with ease into the proximal trachea.   Lidocaine was then used to anesthetize the patient's proximal airways.  An airway inspeciton was performed finding a moderate amount of thin, clear secretions bilaterally.  The bronchoscope was then advanced into the superior segment of the left lower lobe.  A lavage was performed by instilling a total of 180cc of sterile saline and aspirating a total of 59cc of clear fluid. The flexible bronchoscope was then removed from the patient's airways after suctioning of the remaining secretions.  The bite block was removed and the patient was returned to the upright position.  Blood Loss:  None.  Complications:  None.  Bronchoalveolar Lavage: 1. Performed on the Left Lower Lobe:  Sent for cytology, cell count, Fungal smear & culture, AFB smear & culture, and routine culture.  Post Procedure Instructions: Personally spoke with the patient's husband relaying the preliminary results of the procedure.  Instructed him that the patient should seek immediate medical attention if there is any persistent or progressive hemoptysis, difficulty breathing, or chest pain/discomfort.  The patient should also notify me immediately or seek medical attention for any purulent sputum production or fever occuring today or in the coming days.  The patient will be contacted by me once the final results of the studies are available.  The patient should call my office if they have any questions.

## 2015-12-27 NOTE — Discharge Instructions (Signed)
Seek immediate medical attention for any difficulty breathing, chest discomfort, or bloody phlegm. Call Dr. Jamison NeighborNestor if you develop any persistent fever or cough producing a yellow, green, or puss-colored mucus.Flexible Bronchoscopy, Care After These instructions give you information on caring for yourself after your procedure. Your doctor may also give you more specific instructions. Call your doctor if you have any problems or questions after your procedure. HOME CARE  Do not eat or drink anything for 2 hours after your procedure. If you try to eat or drink before the medicine wears off, food or drink could go into your lungs. You could also burn yourself.  After 2 hours have passed and when you can cough and gag normally, you may eat soft food and drink liquids slowly.  The day after the test, you may eat your normal diet.  You may do your normal activities.  Keep all doctor visits. GET HELP RIGHT AWAY IF:  You get more and more short of breath.  You get light-headed.  You feel like you are going to pass out (faint).  You have chest pain.  You have new problems that worry you.  You cough up more than a little blood.  You cough up more blood than before. MAKE SURE YOU:  Understand these instructions.  Will watch your condition.  Will get help right away if you are not doing well or get worse.    Do not eat or drink anything until after 9:30 am on 12/27/15.   This information is not intended to replace advice given to you by your health care provider. Make sure you discuss any questions you have with your health care provider.   Document Released: 04/19/2009 Document Revised: 06/27/2013 Document Reviewed: 02/24/2013 Elsevier Interactive Patient Education [employer]2016 Elsevier Inc.

## 2015-12-27 NOTE — Progress Notes (Signed)
Video bronchoscopy performed.  Intervention bronchial washing.   No complications noted.  Will continue to monitor. 
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
## 2015-12-28 LAB — ACID FAST SMEAR (AFB): ACID FAST SMEAR - AFSCU2: NEGATIVE

## 2015-12-29 LAB — CULTURE, RESPIRATORY W GRAM STAIN: Culture: NORMAL

## 2015-12-29 LAB — CULTURE, RESPIRATORY: SPECIAL REQUESTS: NORMAL

## 2015-12-30 ENCOUNTER — Encounter (HOSPITAL_COMMUNITY): Payer: Self-pay | Admitting: Pulmonary Disease

## 2015-12-30 DIAGNOSIS — Z7409 Other reduced mobility: Secondary | ICD-10-CM | POA: Diagnosis not present

## 2016-01-02 ENCOUNTER — Telehealth: Payer: Self-pay | Admitting: Pulmonary Disease

## 2016-01-02 NOTE — Progress Notes (Signed)
Quick Note:  Called spoke with pt's husband Molly MaduroRobert. Informed him of JN's recs. She voiced understanding and had no further questions. Nothing further needed. ______

## 2016-01-02 NOTE — Telephone Encounter (Signed)
Notes Recorded by Roslynn AmbleJennings E Nestor, MD on 12/31/2015 at 6:24 PM Please call the patient's husband and let him know that cultures have not shown any organisms yet from her lavage. Her cytology was negative for any malignancy as well. Thank you.  Called spoke with pt's husband Molly MaduroRobert. Informed him of JN's recs. He voiced understanding and had no further questions. Nothing further needed.

## 2016-01-06 ENCOUNTER — Other Ambulatory Visit: Payer: Self-pay | Admitting: Neurology

## 2016-01-24 ENCOUNTER — Encounter: Payer: Self-pay | Admitting: Adult Health

## 2016-01-24 ENCOUNTER — Ambulatory Visit (INDEPENDENT_AMBULATORY_CARE_PROVIDER_SITE_OTHER): Payer: Medicare Other | Admitting: Adult Health

## 2016-01-24 VITALS — BP 100/42

## 2016-01-24 DIAGNOSIS — R634 Abnormal weight loss: Secondary | ICD-10-CM | POA: Diagnosis not present

## 2016-01-24 DIAGNOSIS — F0390 Unspecified dementia without behavioral disturbance: Secondary | ICD-10-CM

## 2016-01-24 MED ORDER — MIRTAZAPINE 15 MG PO TABS: ORAL_TABLET | ORAL | Status: AC

## 2016-01-24 NOTE — Progress Notes (Addendum)
   Subjective:    Patient ID: Lisa Shaffer, female    DOB: 04/13/1946, 70 y.o.   MRN: [id number]  HPI 70 year old female who presents with her husband for the chief complaint of weight loss and poor appetite. This has been on ongoing issue for multiple months. She last saw Dr. Caryl NeverBurchette for this in June 2017 at which time her weight was 96 pounds.   This patient has advanced dementia as well as history of protein calorie malnutrition, cavitary lung abscess (which is currently treated with Augmentin), hypertension, history of hyperparathyroidism, hyperlipidemia, history of CVA, history of type 2 diabetes currently controlled off medication.   Dr. Caryl NeverBurchette had also seen her for this issue in March of this year at which time she was worked up which was unrevealing except for a caitary lung abscess.   During the last visit with Dr. Caryl NeverBurchette she was prescribed Megace 40mg /ml for appetite stimulant. Her husband reports that this has not helped. The patient continues to not have an appetite.   Her husband also feels as though it is time to get a home health aide to help with the ADL's   Review of Systems Constitutional: Positive for appetite change and unexpected weight change. Negative for fever and chills.  Respiratory: Negative for shortness of breath.  Cardiovascular: Negative for chest pain and leg swelling.  Gastrointestinal: Negative for nausea, vomiting, abdominal pain, diarrhea and blood in stool.  Endocrine: Negative for polydipsia and polyuria.  Genitourinary: Negative for dysuria.  Neurological: Negative for headaches.     Objective:   Physical Exam Constitutional: She appears well-developed and well-nourished.  HENT:  Mouth/Throat: Oropharynx is clear and moist.  Neck: Neck supple. No thyromegaly present.  Cardiovascular: Normal rate and regular rhythm.  Pulmonary/Chest: Effort normal and breath sounds normal. No respiratory distress. She has no wheezes. She has no rales.    Abdominal: Soft. There is no tenderness.  Musculoskeletal: She exhibits no edema.  Lymphadenopathy:   She has no cervical adenopathy.  Neurological: She is alert.  Psychiatric: She has a normal mood and affect.     Assessment & Plan:  1. Loss of weight - Going to have her d/c Megace  - She is on Zoloft currently but literature shows that it is safe to add Remeron to SSRI. Will try 7.5 mg first and advised to increase to 15 mg if no improvement in 1-2 weeks - mirtazapine (REMERON) 15 MG tablet; Take one half a pill at night  Dispense: 30 tablet; Refill: 3,  - Discussed side effects with patient's husband.  - Also advised husband that this was normal with the progression of dementia.  - Follow up in 3 weeks  2. Dementia, without behavioral disturbance  - [facility]   Lisa Freesory Stanton Kissoon, NP

## 2016-01-24 NOTE — Patient Instructions (Addendum)
It was great seeing you again!  I want you to stop taking Megace   I have sent in a prescription for Remeron - take one half pill at night. If you do not notice any difference in a week then go up to the full pill.   Follow up with me in three weeks.

## 2016-01-27 LAB — FUNGUS CULTURE WITH STAIN

## 2016-01-27 LAB — FUNGAL ORGANISM REFLEX

## 2016-01-27 LAB — FUNGUS CULTURE RESULT

## 2016-01-27 NOTE — Addendum Note (Signed)
Addended by: Nancy Fetter on: 01/27/2016 08:26 AM   Modules accepted: Orders

## 2016-01-28 ENCOUNTER — Telehealth: Payer: Self-pay | Admitting: Pulmonary Disease

## 2016-01-28 ENCOUNTER — Encounter (HOSPITAL_COMMUNITY): Payer: Self-pay

## 2016-01-28 ENCOUNTER — Inpatient Hospital Stay (HOSPITAL_COMMUNITY)
Admission: EM | Admit: 2016-01-28 | Discharge: 2016-02-05 | DRG: 682 | Disposition: A | Discharge status: Hospice | Payer: Medicare Other | Attending: Internal Medicine | Admitting: Internal Medicine

## 2016-01-28 ENCOUNTER — Ambulatory Visit: Payer: Medicare Other | Admitting: Pulmonary Disease

## 2016-01-28 ENCOUNTER — Emergency Department (HOSPITAL_COMMUNITY): Payer: Medicare Other

## 2016-01-28 DIAGNOSIS — Z66 Do not resuscitate: Secondary | ICD-10-CM | POA: Diagnosis not present

## 2016-01-28 DIAGNOSIS — E785 Hyperlipidemia, unspecified: Secondary | ICD-10-CM | POA: Diagnosis not present

## 2016-01-28 DIAGNOSIS — E872 Acidosis, unspecified: Secondary | ICD-10-CM | POA: Diagnosis present

## 2016-01-28 DIAGNOSIS — E1169 Type 2 diabetes mellitus with other specified complication: Secondary | ICD-10-CM | POA: Diagnosis not present

## 2016-01-28 DIAGNOSIS — Z841 Family history of disorders of kidney and ureter: Secondary | ICD-10-CM | POA: Diagnosis not present

## 2016-01-28 DIAGNOSIS — L89892 Pressure ulcer of other site, stage 2: Secondary | ICD-10-CM | POA: Diagnosis present

## 2016-01-28 DIAGNOSIS — Z833 Family history of diabetes mellitus: Secondary | ICD-10-CM | POA: Diagnosis not present

## 2016-01-28 DIAGNOSIS — J189 Pneumonia, unspecified organism: Secondary | ICD-10-CM | POA: Diagnosis present

## 2016-01-28 DIAGNOSIS — L7632 Postprocedural hematoma of skin and subcutaneous tissue following other procedure: Secondary | ICD-10-CM | POA: Diagnosis not present

## 2016-01-28 DIAGNOSIS — Y848 Other medical procedures as the cause of abnormal reaction of the patient, or of later complication, without mention of misadventure at the time of the procedure: Secondary | ICD-10-CM | POA: Diagnosis present

## 2016-01-28 DIAGNOSIS — N179 Acute kidney failure, unspecified: Secondary | ICD-10-CM | POA: Diagnosis not present

## 2016-01-28 DIAGNOSIS — L8990 Pressure ulcer of unspecified site, unspecified stage: Secondary | ICD-10-CM | POA: Insufficient documentation

## 2016-01-28 DIAGNOSIS — B370 Candidal stomatitis: Secondary | ICD-10-CM | POA: Diagnosis not present

## 2016-01-28 DIAGNOSIS — R1110 Vomiting, unspecified: Secondary | ICD-10-CM | POA: Diagnosis not present

## 2016-01-28 DIAGNOSIS — B3781 Candidal esophagitis: Secondary | ICD-10-CM | POA: Diagnosis present

## 2016-01-28 DIAGNOSIS — D62 Acute posthemorrhagic anemia: Secondary | ICD-10-CM | POA: Diagnosis not present

## 2016-01-28 DIAGNOSIS — R634 Abnormal weight loss: Secondary | ICD-10-CM | POA: Diagnosis not present

## 2016-01-28 DIAGNOSIS — R627 Adult failure to thrive: Secondary | ICD-10-CM | POA: Diagnosis present

## 2016-01-28 DIAGNOSIS — G9340 Encephalopathy, unspecified: Secondary | ICD-10-CM | POA: Diagnosis not present

## 2016-01-28 DIAGNOSIS — E119 Type 2 diabetes mellitus without complications: Secondary | ICD-10-CM | POA: Diagnosis not present

## 2016-01-28 DIAGNOSIS — Z681 Body mass index (BMI) 19 or less, adult: Secondary | ICD-10-CM

## 2016-01-28 DIAGNOSIS — Z515 Encounter for palliative care: Secondary | ICD-10-CM | POA: Diagnosis present

## 2016-01-28 DIAGNOSIS — R4182 Altered mental status, unspecified: Secondary | ICD-10-CM

## 2016-01-28 DIAGNOSIS — R112 Nausea with vomiting, unspecified: Secondary | ICD-10-CM | POA: Diagnosis present

## 2016-01-28 DIAGNOSIS — E43 Unspecified severe protein-calorie malnutrition: Secondary | ICD-10-CM | POA: Diagnosis present

## 2016-01-28 DIAGNOSIS — E876 Hypokalemia: Secondary | ICD-10-CM | POA: Diagnosis not present

## 2016-01-28 DIAGNOSIS — I10 Essential (primary) hypertension: Secondary | ICD-10-CM | POA: Diagnosis not present

## 2016-01-28 DIAGNOSIS — F0390 Unspecified dementia without behavioral disturbance: Secondary | ICD-10-CM | POA: Diagnosis present

## 2016-01-28 DIAGNOSIS — Z8249 Family history of ischemic heart disease and other diseases of the circulatory system: Secondary | ICD-10-CM

## 2016-01-28 DIAGNOSIS — I959 Hypotension, unspecified: Secondary | ICD-10-CM | POA: Diagnosis not present

## 2016-01-28 DIAGNOSIS — E874 Mixed disorder of acid-base balance: Secondary | ICD-10-CM | POA: Diagnosis present

## 2016-01-28 DIAGNOSIS — R000 Tachycardia, unspecified: Secondary | ICD-10-CM | POA: Diagnosis present

## 2016-01-28 DIAGNOSIS — E1143 Type 2 diabetes mellitus with diabetic autonomic (poly)neuropathy: Secondary | ICD-10-CM | POA: Diagnosis present

## 2016-01-28 DIAGNOSIS — R7989 Other specified abnormal findings of blood chemistry: Secondary | ICD-10-CM

## 2016-01-28 DIAGNOSIS — E860 Dehydration: Secondary | ICD-10-CM | POA: Diagnosis present

## 2016-01-28 DIAGNOSIS — K3184 Gastroparesis: Secondary | ICD-10-CM | POA: Diagnosis present

## 2016-01-28 DIAGNOSIS — I69354 Hemiplegia and hemiparesis following cerebral infarction affecting left non-dominant side: Secondary | ICD-10-CM

## 2016-01-28 DIAGNOSIS — Z79899 Other long term (current) drug therapy: Secondary | ICD-10-CM

## 2016-01-28 DIAGNOSIS — Z23 Encounter for immunization: Secondary | ICD-10-CM

## 2016-01-28 LAB — CBC
HCT: 25.7 % — ABNORMAL LOW (ref 36.0–46.0)
Hemoglobin: 8.8 g/dL — ABNORMAL LOW (ref 12.0–15.0)
MCH: 25.7 pg — ABNORMAL LOW (ref 26.0–34.0)
MCHC: 34.2 g/dL (ref 30.0–36.0)
MCV: 74.9 fL — AB (ref 78.0–100.0)
PLATELETS: 214 10*3/uL (ref 150–400)
RBC: 3.43 MIL/uL — AB (ref 3.87–5.11)
RDW: 16.6 % — AB (ref 11.5–15.5)
WBC: 4.8 10*3/uL (ref 4.0–10.5)

## 2016-01-28 LAB — COMPREHENSIVE METABOLIC PANEL
ALK PHOS: 85 U/L (ref 38–126)
ALT: 7 U/L — AB (ref 14–54)
AST: 16 U/L (ref 15–41)
Albumin: 3.5 g/dL (ref 3.5–5.0)
Anion gap: 20 — ABNORMAL HIGH (ref 5–15)
BILIRUBIN TOTAL: 1.5 mg/dL — AB (ref 0.3–1.2)
BUN: 28 mg/dL — AB (ref 6–20)
CALCIUM: 9.2 mg/dL (ref 8.9–10.3)
CHLORIDE: 107 mmol/L (ref 101–111)
CO2: 18 mmol/L — ABNORMAL LOW (ref 22–32)
CREATININE: 2.35 mg/dL — AB (ref 0.44–1.00)
GFR, EST AFRICAN AMERICAN: 23 mL/min — AB (ref >60)
GFR, EST NON AFRICAN AMERICAN: 20 mL/min — AB (ref >60)
Glucose, Bld: 163 mg/dL — ABNORMAL HIGH (ref 65–99)
Potassium: 2.4 mmol/L — CL (ref 3.5–5.1)
Sodium: 145 mmol/L (ref 135–145)
TOTAL PROTEIN: 7 g/dL (ref 6.5–8.1)

## 2016-01-28 LAB — I-STAT CG4 LACTIC ACID, ED: LACTIC ACID, VENOUS: 5.64 mmol/L — AB (ref 0.5–1.9)

## 2016-01-28 LAB — MAGNESIUM: MAGNESIUM: 2.2 mg/dL (ref 1.7–2.4)

## 2016-01-28 LAB — LIPASE, BLOOD: LIPASE: 28 U/L (ref 11–51)

## 2016-01-28 MED ORDER — SODIUM CHLORIDE 0.9 % IV SOLN
Freq: Once | INTRAVENOUS | Status: AC
Start: 2016-01-28 — End: 2016-01-28
  Administered 2016-01-28: via INTRAVENOUS

## 2016-01-28 MED ORDER — POTASSIUM CHLORIDE 20 MEQ/15ML (10%) PO SOLN
40.0000 meq | Freq: Once | ORAL | Status: DC
  Filled 2016-01-28: qty 30

## 2016-01-28 MED ORDER — SODIUM CHLORIDE 0.9 % IV BOLUS (SEPSIS)
500.0000 mL | Freq: Once | INTRAVENOUS | Status: AC
  Administered 2016-01-28: 500 mL via INTRAVENOUS

## 2016-01-28 MED ORDER — SODIUM CHLORIDE 0.9 % IV BOLUS (SEPSIS)
500.0000 mL | Freq: Once | INTRAVENOUS | Status: AC
  Administered 2016-01-29: 500 mL via INTRAVENOUS

## 2016-01-28 MED ORDER — POTASSIUM CHLORIDE 10 MEQ/100ML IV SOLN
10.0000 meq | Freq: Once | INTRAVENOUS | Status: AC
  Administered 2016-01-28: 10 meq via INTRAVENOUS
  Filled 2016-01-28: qty 100

## 2016-01-28 MED ORDER — MAGNESIUM SULFATE IN D5W 1-5 GM/100ML-% IV SOLN
1.0000 g | Freq: Once | INTRAVENOUS | Status: AC
  Administered 2016-01-28: 1 g via INTRAVENOUS
  Filled 2016-01-28: qty 100

## 2016-01-28 MED ORDER — MAGNESIUM SULFATE 50 % IJ SOLN: 1.0000 g | Freq: Once | INTRAMUSCULAR | Status: DC

## 2016-01-28 NOTE — Telephone Encounter (Signed)
pts husband returning call

## 2016-01-28 NOTE — Telephone Encounter (Signed)
Called and spoke with pt spouse and made aware of below. Nothing further needed

## 2016-01-28 NOTE — Telephone Encounter (Signed)
MICROBIOLOGY LLL BAL (12/27/15):  Respiratory Flora / Fungus negative / AFB pending  LABS LLL BAL Cell Count (12/27/15):  WBC 17 (77% macro/mono, 19% lymph, 1% eos, 3% neutro)  PATHOLOGY LLL BAL (12/27/15):  No malignant cells. Benign reactive changes.

## 2016-01-28 NOTE — ED Triage Notes (Signed)
Pt had stroke 3 months ago.  Family states "abdominal test done then". Results negative but states md wants her to come in for other test.  Family cannot give any answers regarding what test or what she was seen for.

## 2016-01-28 NOTE — Telephone Encounter (Signed)
Spoke with pt's husband, states that he is concerned about his wife's health status- states that she has AMS when sitting up, confusion about her whereabouts, decreased appetite, frequent vomiting when ingesting liquids, weight loss, inability to get up to the restroom with husband's help.  Pt's husband wants JN's recs of pt's current status to see if she needs to be hospitalized.   Pt's PCP put her on Remeron X3 days ago to help with mental status changes.   Pt was scheduled for an appt this morning but appt was cancelled as pt was not present d/t inability to get out of bed- I advised pt's husband that the pt would need to be present for an office visit, but that I would forward his concerns to [facility] for his recs.  Pt then became upset that he could not take pt's appt time, walked away, and got on elevator to leave despite my attempt to speak to him and further explain.    JN please advise.  Thanks.

## 2016-01-28 NOTE — Telephone Encounter (Signed)
lmtcb X1 for pt's husband.

## 2016-01-28 NOTE — ED Triage Notes (Signed)
Pt states weight loss is d/t vomiting for 3 months.
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
## 2016-01-28 NOTE — Telephone Encounter (Signed)
Please call and let him know you spoke with me. Let him know that her cultures still haven't grown anything. However, with what is described I feel she needs to at least be evaluated in the closest E.D. And possibly even admitted for further workup and testing depending on what is found.

## 2016-01-29 ENCOUNTER — Inpatient Hospital Stay (HOSPITAL_COMMUNITY): Payer: Medicare Other

## 2016-01-29 ENCOUNTER — Emergency Department (HOSPITAL_COMMUNITY): Payer: Medicare Other

## 2016-01-29 ENCOUNTER — Encounter (HOSPITAL_COMMUNITY): Payer: Self-pay | Admitting: Internal Medicine

## 2016-01-29 DIAGNOSIS — J189 Pneumonia, unspecified organism: Secondary | ICD-10-CM | POA: Diagnosis not present

## 2016-01-29 DIAGNOSIS — E876 Hypokalemia: Secondary | ICD-10-CM | POA: Diagnosis not present

## 2016-01-29 DIAGNOSIS — E872 Acidosis, unspecified: Secondary | ICD-10-CM | POA: Diagnosis present

## 2016-01-29 DIAGNOSIS — Z515 Encounter for palliative care: Secondary | ICD-10-CM | POA: Diagnosis not present

## 2016-01-29 DIAGNOSIS — R1110 Vomiting, unspecified: Secondary | ICD-10-CM

## 2016-01-29 DIAGNOSIS — E874 Mixed disorder of acid-base balance: Secondary | ICD-10-CM | POA: Diagnosis not present

## 2016-01-29 DIAGNOSIS — L7632 Postprocedural hematoma of skin and subcutaneous tissue following other procedure: Secondary | ICD-10-CM | POA: Diagnosis not present

## 2016-01-29 DIAGNOSIS — Z833 Family history of diabetes mellitus: Secondary | ICD-10-CM | POA: Diagnosis not present

## 2016-01-29 DIAGNOSIS — D62 Acute posthemorrhagic anemia: Secondary | ICD-10-CM | POA: Diagnosis not present

## 2016-01-29 DIAGNOSIS — F0390 Unspecified dementia without behavioral disturbance: Secondary | ICD-10-CM | POA: Diagnosis present

## 2016-01-29 DIAGNOSIS — E119 Type 2 diabetes mellitus without complications: Secondary | ICD-10-CM | POA: Diagnosis not present

## 2016-01-29 DIAGNOSIS — I959 Hypotension, unspecified: Secondary | ICD-10-CM | POA: Diagnosis not present

## 2016-01-29 DIAGNOSIS — Y848 Other medical procedures as the cause of abnormal reaction of the patient, or of later complication, without mention of misadventure at the time of the procedure: Secondary | ICD-10-CM | POA: Diagnosis not present

## 2016-01-29 DIAGNOSIS — I69354 Hemiplegia and hemiparesis following cerebral infarction affecting left non-dominant side: Secondary | ICD-10-CM | POA: Diagnosis not present

## 2016-01-29 DIAGNOSIS — Z841 Family history of disorders of kidney and ureter: Secondary | ICD-10-CM | POA: Diagnosis not present

## 2016-01-29 DIAGNOSIS — Z8249 Family history of ischemic heart disease and other diseases of the circulatory system: Secondary | ICD-10-CM | POA: Diagnosis not present

## 2016-01-29 DIAGNOSIS — Z23 Encounter for immunization: Secondary | ICD-10-CM | POA: Diagnosis not present

## 2016-01-29 DIAGNOSIS — E860 Dehydration: Secondary | ICD-10-CM | POA: Diagnosis not present

## 2016-01-29 DIAGNOSIS — I10 Essential (primary) hypertension: Secondary | ICD-10-CM | POA: Diagnosis not present

## 2016-01-29 DIAGNOSIS — R4182 Altered mental status, unspecified: Secondary | ICD-10-CM | POA: Diagnosis not present

## 2016-01-29 DIAGNOSIS — L8990 Pressure ulcer of unspecified site, unspecified stage: Secondary | ICD-10-CM | POA: Insufficient documentation

## 2016-01-29 DIAGNOSIS — N189 Chronic kidney disease, unspecified: Secondary | ICD-10-CM | POA: Diagnosis not present

## 2016-01-29 DIAGNOSIS — N179 Acute kidney failure, unspecified: Principal | ICD-10-CM

## 2016-01-29 DIAGNOSIS — E785 Hyperlipidemia, unspecified: Secondary | ICD-10-CM | POA: Diagnosis not present

## 2016-01-29 DIAGNOSIS — E43 Unspecified severe protein-calorie malnutrition: Secondary | ICD-10-CM | POA: Diagnosis not present

## 2016-01-29 DIAGNOSIS — E1169 Type 2 diabetes mellitus with other specified complication: Secondary | ICD-10-CM | POA: Diagnosis not present

## 2016-01-29 DIAGNOSIS — S301XXA Contusion of abdominal wall, initial encounter: Secondary | ICD-10-CM | POA: Diagnosis not present

## 2016-01-29 DIAGNOSIS — Z66 Do not resuscitate: Secondary | ICD-10-CM | POA: Diagnosis not present

## 2016-01-29 DIAGNOSIS — K7689 Other specified diseases of liver: Secondary | ICD-10-CM | POA: Diagnosis not present

## 2016-01-29 DIAGNOSIS — Z681 Body mass index (BMI) 19 or less, adult: Secondary | ICD-10-CM | POA: Diagnosis not present

## 2016-01-29 DIAGNOSIS — L89892 Pressure ulcer of other site, stage 2: Secondary | ICD-10-CM | POA: Diagnosis not present

## 2016-01-29 DIAGNOSIS — B370 Candidal stomatitis: Secondary | ICD-10-CM | POA: Diagnosis not present

## 2016-01-29 DIAGNOSIS — B3781 Candidal esophagitis: Secondary | ICD-10-CM | POA: Diagnosis not present

## 2016-01-29 DIAGNOSIS — R000 Tachycardia, unspecified: Secondary | ICD-10-CM | POA: Diagnosis not present

## 2016-01-29 DIAGNOSIS — Z7409 Other reduced mobility: Secondary | ICD-10-CM | POA: Diagnosis not present

## 2016-01-29 DIAGNOSIS — G9340 Encephalopathy, unspecified: Secondary | ICD-10-CM | POA: Diagnosis not present

## 2016-01-29 LAB — BASIC METABOLIC PANEL
Anion gap: 17 — ABNORMAL HIGH (ref 5–15)
BUN: 25 mg/dL — ABNORMAL HIGH (ref 6–20)
CHLORIDE: 114 mmol/L — AB (ref 101–111)
CO2: 14 mmol/L — AB (ref 22–32)
CREATININE: 2.08 mg/dL — AB (ref 0.44–1.00)
Calcium: 8 mg/dL — ABNORMAL LOW (ref 8.9–10.3)
GFR calc non Af Amer: 23 mL/min — ABNORMAL LOW (ref >60)
GFR, EST AFRICAN AMERICAN: 27 mL/min — AB (ref >60)
Glucose, Bld: 83 mg/dL (ref 65–99)
POTASSIUM: 3 mmol/L — AB (ref 3.5–5.1)
SODIUM: 145 mmol/L (ref 135–145)

## 2016-01-29 LAB — BLOOD GAS, ARTERIAL
ACID-BASE DEFICIT: 10 mmol/L — AB (ref 0.0–2.0)
BICARBONATE: 12.3 meq/L — AB (ref 20.0–24.0)
Drawn by: 308601
FIO2: 0.21
O2 SAT: 98.4 %
PATIENT TEMPERATURE: 96.7
PO2 ART: 119 mmHg — AB (ref 80.0–100.0)
TCO2: 11.6 mmol/L (ref 0–100)
pCO2 arterial: 15.6 mmHg — CL (ref 35.0–45.0)
pH, Arterial: 7.503 — ABNORMAL HIGH (ref 7.350–7.450)

## 2016-01-29 LAB — LACTIC ACID, PLASMA
LACTIC ACID, VENOUS: 4.3 mmol/L — AB (ref 0.5–1.9)
Lactic Acid, Venous: 3.8 mmol/L (ref 0.5–1.9)

## 2016-01-29 LAB — HEPATIC FUNCTION PANEL
ALT: 8 U/L — ABNORMAL LOW (ref 14–54)
AST: 21 U/L (ref 15–41)
Albumin: 3 g/dL — ABNORMAL LOW (ref 3.5–5.0)
Alkaline Phosphatase: 76 U/L (ref 38–126)
BILIRUBIN DIRECT: 0.1 mg/dL (ref 0.1–0.5)
BILIRUBIN TOTAL: 1.8 mg/dL — AB (ref 0.3–1.2)
Indirect Bilirubin: 1.7 mg/dL — ABNORMAL HIGH (ref 0.3–0.9)
Total Protein: 5.9 g/dL — ABNORMAL LOW (ref 6.5–8.1)

## 2016-01-29 LAB — URINALYSIS, ROUTINE W REFLEX MICROSCOPIC
GLUCOSE, UA: NEGATIVE mg/dL
HGB URINE DIPSTICK: NEGATIVE
Ketones, ur: NEGATIVE mg/dL
LEUKOCYTES UA: NEGATIVE
Nitrite: NEGATIVE
PH: 5.5 (ref 5.0–8.0)
PROTEIN: NEGATIVE mg/dL
Specific Gravity, Urine: 1.015 (ref 1.005–1.030)

## 2016-01-29 LAB — SODIUM, URINE, RANDOM: Sodium, Ur: 89 mmol/L

## 2016-01-29 LAB — PROCALCITONIN: Procalcitonin: 0.65 ng/mL

## 2016-01-29 LAB — I-STAT CG4 LACTIC ACID, ED: Lactic Acid, Venous: 4.93 mmol/L (ref 0.5–1.9)

## 2016-01-29 LAB — GLUCOSE, CAPILLARY
GLUCOSE-CAPILLARY: 130 mg/dL — AB (ref 65–99)
GLUCOSE-CAPILLARY: 95 mg/dL (ref 65–99)
GLUCOSE-CAPILLARY: 87 mg/dL (ref 65–99)
Glucose-Capillary: 61 mg/dL — ABNORMAL LOW (ref 65–99)
Glucose-Capillary: 121 mg/dL — ABNORMAL HIGH (ref 65–99)
Glucose-Capillary: 98 mg/dL (ref 65–99)

## 2016-01-29 LAB — CREATININE, URINE, RANDOM: CREATININE, URINE: 56.35 mg/dL

## 2016-01-29 LAB — MRSA PCR SCREENING: MRSA BY PCR: NEGATIVE

## 2016-01-29 LAB — MAGNESIUM: MAGNESIUM: 2.1 mg/dL (ref 1.7–2.4)

## 2016-01-29 LAB — TSH: TSH: 0.947 u[IU]/mL (ref 0.350–4.500)

## 2016-01-29 LAB — TROPONIN I
TROPONIN I: 0.03 ng/mL — AB (ref <0.03)
TROPONIN I: 0.03 ng/mL — AB (ref <0.03)

## 2016-01-29 MED ORDER — DEXTROSE 50 % IV SOLN
INTRAVENOUS | Status: AC
  Administered 2016-01-29: 25 mL
  Filled 2016-01-29: qty 50

## 2016-01-29 MED ORDER — HYDRALAZINE HCL 20 MG/ML IJ SOLN
10.0000 mg | INTRAMUSCULAR | Status: DC | PRN
  Administered 2016-01-30: 10 mg via INTRAVENOUS
  Filled 2016-01-29: qty 1

## 2016-01-29 MED ORDER — ACETAMINOPHEN 650 MG RE SUPP
650.0000 mg | Freq: Four times a day (QID) | RECTAL | Status: DC | PRN
  Administered 2016-02-04: 650 mg via RECTAL
  Filled 2016-01-29: qty 1

## 2016-01-29 MED ORDER — ONDANSETRON HCL 4 MG/2ML IJ SOLN: 4.0000 mg | Freq: Four times a day (QID) | INTRAMUSCULAR | Status: DC | PRN

## 2016-01-29 MED ORDER — DIATRIZOATE MEGLUMINE & SODIUM 66-10 % PO SOLN: 15.0000 mL | Freq: Once | ORAL | Status: DC

## 2016-01-29 MED ORDER — ONDANSETRON HCL 4 MG PO TABS: 4.0000 mg | ORAL_TABLET | Freq: Four times a day (QID) | ORAL | Status: DC | PRN

## 2016-01-29 MED ORDER — PNEUMOCOCCAL VAC POLYVALENT 25 MCG/0.5ML IJ INJ
0.5000 mL | INJECTION | INTRAMUSCULAR | Status: AC
  Administered 2016-01-30: 0.5 mL via INTRAMUSCULAR
  Filled 2016-01-29 (×2): qty 0.5

## 2016-01-29 MED ORDER — POTASSIUM CHLORIDE 10 MEQ/100ML IV SOLN
10.0000 meq | INTRAVENOUS | Status: AC
  Administered 2016-01-29 (×2): 10 meq via INTRAVENOUS
  Filled 2016-01-29 (×2): qty 100

## 2016-01-29 MED ORDER — ACETAMINOPHEN 325 MG PO TABS: 650.0000 mg | ORAL_TABLET | Freq: Four times a day (QID) | ORAL | Status: DC | PRN

## 2016-01-29 MED ORDER — POTASSIUM CHLORIDE 10 MEQ/100ML IV SOLN
10.0000 meq | INTRAVENOUS | Status: AC
  Administered 2016-01-29 (×3): 10 meq via INTRAVENOUS
  Filled 2016-01-29 (×2): qty 100

## 2016-01-29 MED ORDER — HEPARIN SODIUM (PORCINE) 5000 UNIT/ML IJ SOLN
5000.0000 [IU] | Freq: Three times a day (TID) | INTRAMUSCULAR | Status: DC
  Administered 2016-01-29 – 2016-02-03 (×18): 5000 [IU] via SUBCUTANEOUS
  Filled 2016-01-29 (×19): qty 1

## 2016-01-29 MED ORDER — PIPERACILLIN-TAZOBACTAM IN DEX 2-0.25 GM/50ML IV SOLN
2.2500 g | Freq: Four times a day (QID) | INTRAVENOUS | Status: DC
  Administered 2016-01-29 – 2016-02-01 (×11): 2.25 g via INTRAVENOUS
  Filled 2016-01-29 (×14): qty 50

## 2016-01-29 MED ORDER — ASPIRIN 300 MG RE SUPP
300.0000 mg | Freq: Every day | RECTAL | Status: DC
  Administered 2016-01-29 – 2016-02-03 (×6): 300 mg via RECTAL
  Filled 2016-01-29 (×7): qty 1

## 2016-01-29 MED ORDER — CETYLPYRIDINIUM CHLORIDE 0.05 % MT LIQD
7.0000 mL | Freq: Two times a day (BID) | OROMUCOSAL | Status: DC
  Administered 2016-01-29 – 2016-02-05 (×10): 7 mL via OROMUCOSAL

## 2016-01-29 MED ORDER — INSULIN ASPART 100 UNIT/ML ~~LOC~~ SOLN
0.0000 [IU] | SUBCUTANEOUS | Status: DC
  Administered 2016-01-29 – 2016-02-01 (×3): 1 [IU] via SUBCUTANEOUS
  Administered 2016-02-01: 2 [IU] via SUBCUTANEOUS
  Administered 2016-02-02: 1 [IU] via SUBCUTANEOUS

## 2016-01-29 MED ORDER — PIPERACILLIN-TAZOBACTAM IN DEX 2-0.25 GM/50ML IV SOLN
2.2500 g | INTRAVENOUS | Status: AC
  Administered 2016-01-29: 2.25 g via INTRAVENOUS
  Filled 2016-01-29: qty 50

## 2016-01-29 MED ORDER — SODIUM CHLORIDE 0.9 % IV SOLN
INTRAVENOUS | Status: AC
Start: 2016-01-29 — End: 2016-01-30
  Administered 2016-01-29 (×2): via INTRAVENOUS

## 2016-01-29 NOTE — ED Notes (Addendum)
Pt unable to hold down oral contrast

## 2016-01-29 NOTE — Progress Notes (Signed)
Pharmacy Antibiotic Note  Lisa Shaffer is a 70 y.o. female admitted on 01/28/2016 with weight loss, dehydration, AKI, lactic acidosis, but initially no source of infection noted (CXR with existing but stable cavitary lesion, UA w/o sign of infection, CT abdomen w/o acute process).  She is now suspected of aspiration pneumonia.  Pharmacy has been consulted for Zosyn dosing.  Plan:  Zosyn 2.25g IV q6h  Follow up renal fxn, culture results, and clinical course.   Height: 4\' 8"  (142.2 cm) Weight: 87 lb 1.3 oz (39.5 kg) IBW/kg (Calculated) : 36.3  Temp (24hrs), Avg:97.2 F (36.2 C), Min:96.7 F (35.9 C), Max:97.4 F (36.3 C)   Recent Labs Lab 01/28/16 2005 01/28/16 2216 01/29/16 0122 01/29/16 0239 01/29/16 0535  WBC 4.8  --   --   --   --   CREATININE 2.35*  --   --   --  2.08*  LATICACIDVEN  --  5.64* 4.93* 4.3* 3.8*    Estimated Creatinine Clearance: 14.4 mL/min (by C-G formula based on SCr of 2.08 mg/dL).    No Known Allergies  Antimicrobials this admission: 7/26 Zosyn >>  Dose adjustments this admission:   Microbiology results:  BCx: 7/26 MRSA PCR: negative  Thank you for allowing pharmacy to be a part of this patient's care.  Lynann Beaver PharmD, BCPS Pager [phone] 01/29/2016 7:12 AM

## 2016-01-29 NOTE — Progress Notes (Signed)
Patient admitted after midnight, please see H&P.  Family reports sharp decline in last few months.  Not eating, or vomiting after eating---  Very dry mucous membranes, ? Thrush NPO per speech for now -repeat labs in AM -palliative care consult called  Marlin Canary DO
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
## 2016-01-29 NOTE — Progress Notes (Signed)
Initial Nutrition Assessment  DOCUMENTATION CODES:   Severe malnutrition in context of acute illness/injury  INTERVENTION:  - Diet advancement if medically feasible. - If diet unable to be advanced and if TF within GOC, recommend Osmolite 1.2 @ 40 mL/hr which will provide 1152 kcal, 53 grams of protein, and 787 mL free water.  - RD will follow-up 7/28.  NUTRITION DIAGNOSIS:   Inadequate oral intake related to inability to eat as evidenced by NPO status.  GOAL:   Patient will meet greater than or equal to 90% of their needs  MONITOR:   Diet advancement, Weight trends, Labs, Skin, I & O's  REASON FOR ASSESSMENT:   Malnutrition Screening Tool  ASSESSMENT:   70 y.o. female with stroke with left-sided hemiplegia, dementia, diabetes mellitus, hypertension, chronic anemia and recently diagnosed left lung abscess was treated with Augmentin prolonged course and also had bronchoscopy last month was brought to the ER because of poor oral intake over the last few weeks. Patient's husband states patient may have lost considerable weight over the last 2 months. As per the patient's husband patient has not been eating well for last few weeks and particularly last 2 weeks. Patient throws up whenever she eats, denies any diarrhea. Unable to take her medications for at least last 2 weeks. Patient was recently prescribed Megace by patient's primary care physician. On my exam patient is not in distress. Patient's abdomen appears benign. Patient has been throwing up after the admission. Labs reveal acute renal failure with elevated lactate levels and chest x-ray shows improving left midlung zone cavitary lesion pneumonia. Patient is being admitted for acute renal failure probably from dehydration and persistent nausea vomiting.   Pt seen for MST. BMI indicates normal weight. Pt has been NPO since admission and unable to meet estimated nutrition needs. Pt with hx of dementia and notes indicate that  mentation has been declining recently. No family/visitors present at bedside at this time. Pt is not a good source of information as she nods her head "yes" to all questions asked.   Information concerning poor appetite, intakes, and intermittent vomiting (especially with forced feedings) outlined in brief summary from H&P above; pt with emesis basin in front of her during RD visit. Notes also indicate that PCP had prescribed pt Megace recently.   Physical assessment indicates moderate and severe muscle and moderate and severe fat wasting present, no edema. Per chart review, pt has lost 7 lbs (7.4% body weight) in the past 1 month which is significant for time frame.   SLP saw pt earlier this AM and note shortly after 10 AM states recommendation for NPO status with "consider GI evaluation." TF recommendations outlined above should they be warranted and if nutrition support within GOC. Will monitor for possible GI note; will monitor if pt's GI functionality would warrant post-pyloric feeds.   Not able to meet needs at this time.  Medications reviewed; sliding scale Novolog, 1 g Mg sulfate x1 dose yesterday, PRN Zofran, 10 mEq IV KCl x1 dose yesterday, 10 mEq IV KCl x3 doses today, 40 mEq oral KCl x1 dose yesterday.  Labs reviewed; CBGs: 61-130 mg/dL, K: 3 mmol/L, Cl: 644 mmol/L, creatinine: 2.08 mg/dL, BUN: 25 mg/dL, Ca: 8 mg/dL, GFR: 27 mL/min. IVF: NS @ 100 mL/hr.    Diet Order:  Diet NPO time specified  Skin:  Wound (see comment) (Stage 2 rectal pressure injury)  Last BM:  7/25  Height:   Ht Readings from Last 1 Encounters:  01/29/16  4\' 8"  (1.422 m)    Weight:   Wt Readings from Last 1 Encounters:  01/29/16 87 lb 1.3 oz (39.5 kg)    Ideal Body Weight:  40.73 kg (kg)  BMI:  Body mass index is 19.52 kg/m.  Estimated Nutritional Needs:   Kcal:  1180-1380 (30-35 kcal/kg)  Protein:  45-55 grams/kg  Fluid:  1.4-1.6 L/day  EDUCATION NEEDS:   No education needs identified at  this time    Trenton Gammon, MS, RD, LDN Inpatient Clinical Dietitian Pager # [phone] After hours/weekend pager # [phone]

## 2016-01-29 NOTE — Progress Notes (Signed)
CRITICAL VALUE ALERT  Critical value received:  Lactic 4.3/ Troponin 0.03  Date of notification: 01/29/16  Time of notification:  0315  Critical value read back:Yes.    Nurse who received alert:  VW, rn.  MD notified (1st page):  Dr. Toniann Fail  Time of first page: 0330  MD notified (2nd page):  Time of second page:  Responding MD:  Dr. Toniann Fail  Time MD responded:  [phone]
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
## 2016-01-29 NOTE — Progress Notes (Signed)
Inpatient Diabetes Program Recommendations  AACE/ADA: New Consensus Statement on Inpatient Glycemic Control (2015)  Target Ranges:  Prepandial:   less than 140 mg/dL      Peak postprandial:   less than 180 mg/dL (1-2 hours)      Critically ill patients:  140 - 180 mg/dL   Results for Lisa Shaffer, Lisa Shaffer (MRN [id number]) as of 01/29/2016 09:06  Ref. Range 01/29/2016 04:29 01/29/2016 08:13  Glucose-Capillary Latest Ref Range: 65 - 99 mg/dL 208 (H) 61 (L)    Admit with: Acute renal failure with metabolic acidosis    History: DM, CVA  Home DM Meds: None for 2-3 weeks  Current Insulin Orders: Novolog Sensitive Correction Scale/ SSI (0-9 units) Q4 hours     MD- Note patient with Hypoglycemia at 8am today (CBG 61 mg/dl) after receiving 1 unit Novolog at 4am for CBG of 130 mg/dl.  If patient continues to have Hypoglycemia, please consider d/c of Novolog SSI for now and monitor CBGs Q4 hours     --Will follow patient during hospitalization--  Ambrose Finland RN, MSN, CDE Diabetes Coordinator Inpatient Glycemic Control Team Team Pager: [phone] (8a-5p)

## 2016-01-30 ENCOUNTER — Inpatient Hospital Stay (HOSPITAL_COMMUNITY): Payer: Medicare Other

## 2016-01-30 ENCOUNTER — Inpatient Hospital Stay (HOSPITAL_COMMUNITY)
Admit: 2016-01-30 | Discharge: 2016-01-30 | Disposition: A | Discharge status: Home / Self Care | Payer: Medicare Other | Attending: Internal Medicine | Admitting: Internal Medicine

## 2016-01-30 DIAGNOSIS — L89892 Pressure ulcer of other site, stage 2: Secondary | ICD-10-CM | POA: Diagnosis not present

## 2016-01-30 DIAGNOSIS — B370 Candidal stomatitis: Secondary | ICD-10-CM | POA: Diagnosis not present

## 2016-01-30 DIAGNOSIS — E860 Dehydration: Secondary | ICD-10-CM | POA: Diagnosis not present

## 2016-01-30 DIAGNOSIS — B3781 Candidal esophagitis: Secondary | ICD-10-CM | POA: Diagnosis not present

## 2016-01-30 DIAGNOSIS — J189 Pneumonia, unspecified organism: Secondary | ICD-10-CM | POA: Diagnosis not present

## 2016-01-30 DIAGNOSIS — R000 Tachycardia, unspecified: Secondary | ICD-10-CM | POA: Diagnosis not present

## 2016-01-30 DIAGNOSIS — I69354 Hemiplegia and hemiparesis following cerebral infarction affecting left non-dominant side: Secondary | ICD-10-CM | POA: Diagnosis not present

## 2016-01-30 DIAGNOSIS — E785 Hyperlipidemia, unspecified: Secondary | ICD-10-CM | POA: Diagnosis not present

## 2016-01-30 DIAGNOSIS — E876 Hypokalemia: Secondary | ICD-10-CM

## 2016-01-30 DIAGNOSIS — Z23 Encounter for immunization: Secondary | ICD-10-CM | POA: Diagnosis not present

## 2016-01-30 DIAGNOSIS — I959 Hypotension, unspecified: Secondary | ICD-10-CM | POA: Diagnosis not present

## 2016-01-30 DIAGNOSIS — Z515 Encounter for palliative care: Secondary | ICD-10-CM | POA: Diagnosis not present

## 2016-01-30 DIAGNOSIS — N179 Acute kidney failure, unspecified: Secondary | ICD-10-CM | POA: Diagnosis not present

## 2016-01-30 DIAGNOSIS — E119 Type 2 diabetes mellitus without complications: Secondary | ICD-10-CM

## 2016-01-30 DIAGNOSIS — E872 Acidosis: Secondary | ICD-10-CM

## 2016-01-30 DIAGNOSIS — Z8249 Family history of ischemic heart disease and other diseases of the circulatory system: Secondary | ICD-10-CM | POA: Diagnosis not present

## 2016-01-30 DIAGNOSIS — Z66 Do not resuscitate: Secondary | ICD-10-CM | POA: Diagnosis not present

## 2016-01-30 DIAGNOSIS — G9340 Encephalopathy, unspecified: Secondary | ICD-10-CM | POA: Diagnosis not present

## 2016-01-30 DIAGNOSIS — E43 Unspecified severe protein-calorie malnutrition: Secondary | ICD-10-CM | POA: Diagnosis not present

## 2016-01-30 DIAGNOSIS — L7632 Postprocedural hematoma of skin and subcutaneous tissue following other procedure: Secondary | ICD-10-CM | POA: Diagnosis not present

## 2016-01-30 DIAGNOSIS — E874 Mixed disorder of acid-base balance: Secondary | ICD-10-CM | POA: Diagnosis not present

## 2016-01-30 DIAGNOSIS — D62 Acute posthemorrhagic anemia: Secondary | ICD-10-CM | POA: Diagnosis not present

## 2016-01-30 DIAGNOSIS — I10 Essential (primary) hypertension: Secondary | ICD-10-CM | POA: Diagnosis not present

## 2016-01-30 DIAGNOSIS — Z833 Family history of diabetes mellitus: Secondary | ICD-10-CM | POA: Diagnosis not present

## 2016-01-30 LAB — CBC
HEMATOCRIT: 20.5 % — AB (ref 36.0–46.0)
HEMOGLOBIN: 7 g/dL — AB (ref 12.0–15.0)
MCH: 25.1 pg — ABNORMAL LOW (ref 26.0–34.0)
MCHC: 34.1 g/dL (ref 30.0–36.0)
MCV: 73.5 fL — AB (ref 78.0–100.0)
Platelets: 161 10*3/uL (ref 150–400)
RBC: 2.79 MIL/uL — AB (ref 3.87–5.11)
RDW: 16.2 % — AB (ref 11.5–15.5)
WBC: 6.6 10*3/uL (ref 4.0–10.5)

## 2016-01-30 LAB — BASIC METABOLIC PANEL
Anion gap: 14 (ref 5–15)
BUN: 16 mg/dL (ref 6–20)
CALCIUM: 7.6 mg/dL — AB (ref 8.9–10.3)
CO2: 15 mmol/L — ABNORMAL LOW (ref 22–32)
CREATININE: 1.7 mg/dL — AB (ref 0.44–1.00)
Chloride: 118 mmol/L — ABNORMAL HIGH (ref 101–111)
GFR calc Af Amer: 34 mL/min — ABNORMAL LOW (ref >60)
GFR, EST NON AFRICAN AMERICAN: 29 mL/min — AB (ref >60)
Glucose, Bld: 96 mg/dL (ref 65–99)
Potassium: 3.6 mmol/L (ref 3.5–5.1)
SODIUM: 147 mmol/L — AB (ref 135–145)

## 2016-01-30 LAB — COMPREHENSIVE METABOLIC PANEL
ALK PHOS: 75 U/L (ref 38–126)
ALT: 7 U/L — ABNORMAL LOW (ref 14–54)
ANION GAP: 15 (ref 5–15)
AST: 14 U/L — AB (ref 15–41)
Albumin: 2.7 g/dL — ABNORMAL LOW (ref 3.5–5.0)
BILIRUBIN TOTAL: 1.4 mg/dL — AB (ref 0.3–1.2)
BUN: 17 mg/dL (ref 6–20)
CHLORIDE: 116 mmol/L — AB (ref 101–111)
CO2: 16 mmol/L — AB (ref 22–32)
Calcium: 7.6 mg/dL — ABNORMAL LOW (ref 8.9–10.3)
Creatinine, Ser: 1.74 mg/dL — ABNORMAL HIGH (ref 0.44–1.00)
GFR, EST AFRICAN AMERICAN: 33 mL/min — AB (ref >60)
GFR, EST NON AFRICAN AMERICAN: 29 mL/min — AB (ref >60)
Glucose, Bld: 88 mg/dL (ref 65–99)
Potassium: 2.6 mmol/L — CL (ref 3.5–5.1)
Sodium: 147 mmol/L — ABNORMAL HIGH (ref 135–145)
Total Protein: 5.6 g/dL — ABNORMAL LOW (ref 6.5–8.1)

## 2016-01-30 LAB — GLUCOSE, CAPILLARY
GLUCOSE-CAPILLARY: 87 mg/dL (ref 65–99)
GLUCOSE-CAPILLARY: 95 mg/dL (ref 65–99)
GLUCOSE-CAPILLARY: 125 mg/dL — AB (ref 65–99)
GLUCOSE-CAPILLARY: 73 mg/dL (ref 65–99)
Glucose-Capillary: 76 mg/dL (ref 65–99)
Glucose-Capillary: 79 mg/dL (ref 65–99)

## 2016-01-30 LAB — TROPONIN I
TROPONIN I: 0.03 ng/mL — AB (ref <0.03)
Troponin I: 0.03 ng/mL (ref <0.03)

## 2016-01-30 LAB — LACTIC ACID, PLASMA: Lactic Acid, Venous: 1.8 mmol/L (ref 0.5–1.9)

## 2016-01-30 MED ORDER — SODIUM CHLORIDE 0.45 % IV SOLN
INTRAVENOUS | Status: DC
  Administered 2016-01-30: 16:00:00 via INTRAVENOUS

## 2016-01-30 MED ORDER — FLUCONAZOLE IN SODIUM CHLORIDE 100-0.9 MG/50ML-% IV SOLN
100.0000 mg | INTRAVENOUS | Status: DC
  Administered 2016-01-30 – 2016-02-01 (×3): 100 mg via INTRAVENOUS
  Filled 2016-01-30 (×4): qty 50

## 2016-01-30 MED ORDER — POTASSIUM CHLORIDE 10 MEQ/100ML IV SOLN
10.0000 meq | INTRAVENOUS | Status: AC
  Administered 2016-01-30 (×5): 10 meq via INTRAVENOUS
  Filled 2016-01-30 (×5): qty 100

## 2016-01-30 MED ORDER — METOCLOPRAMIDE HCL 5 MG/ML IJ SOLN
5.0000 mg | Freq: Four times a day (QID) | INTRAMUSCULAR | Status: DC
  Administered 2016-01-30 – 2016-02-03 (×16): 5 mg via INTRAVENOUS
  Filled 2016-01-30 (×19): qty 2

## 2016-01-30 MED ORDER — THIAMINE HCL 100 MG/ML IJ SOLN
100.0000 mg | Freq: Every day | INTRAMUSCULAR | Status: DC
  Administered 2016-01-30 – 2016-02-03 (×5): 100 mg via INTRAVENOUS
  Filled 2016-01-30 (×6): qty 2

## 2016-01-30 NOTE — Progress Notes (Signed)
Offsite EEG completed, results pending. 

## 2016-01-30 NOTE — Progress Notes (Addendum)
PROGRESS NOTE    Lisa Shaffer  ZOX:[id number] DOB: Dec 02, 1945 DOA: 01/28/2016 PCP: [name], DO   Outpatient Specialists:     Brief Narrative:  Lisa Shaffer is a 70 y.o. female with stroke with left-sided hemiplegia, dementia, diabetes mellitus, hypertension, chronic anemia and recently diagnosed left lung abscess was treated with Augmentin prolonged course and also had bronchoscopy last month was brought to the ER because of poor oral intake over the last few weeks. Patient's husband states patient may have lost considerable weight over the last 2 months. As per the patient's husband patient has not been eating well for last few weeks and particularly last 2 weeks. Patient throws up whenever she eats, denies any diarrhea. Unable to take her medications for at least last 2 weeks. Patient was recently prescribed Megace by patient's primary care physician. On my exam patient is not in distress. Patient's abdomen appears benign. Patient has been throwing up after the admission. Labs reveal acute renal failure with elevated lactate levels and chest x-ray shows improving left midlung zone cavitary lesion pneumonia. Patient is being admitted for acute renal failure probably from dehydration and persistent nausea vomiting.    Assessment & Plan:   Principal Problem:   Acute renal failure (HCC) Active Problems:   Essential hypertension   Nausea & vomiting   DM2 (diabetes mellitus, type 2) (HCC)   Hypokalemia   Lactic acidosis   ARF (acute renal failure) (HCC)   Pressure ulcer   Palliative care patient   AMS  -? If this is patient's baseline with her dementia  -EEG  -MRI  Acute renal failure with metabolic acidosis -  - lactate level is markedly elevated upon admission, much improved.  -Continue with gentle hydration   Persistent nausea vomiting -  ? 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  Elevated lactate levels - probably from persistent nausea and vomiting and poor oral intake and dehydration. Continue with hydration   History of lung abscesses with chest x-ray showing improvement  - Zosyn for now and follow blood cultures.  Diabetes mellitus type 2 - ] -sliding scale coverage.  Hypertension -  -hold PO meds  History of dementia. -appears advances -not able to get idea of patient's baseline per husband -palliative care consult  Chronic anemia - follow CBC. -transfuse for < 6  History of stroke with left-sided hemiplegia on aspirin.   Hypokalemia  -replace IV  -Mg normal  DVT prophylaxis:    Code Status: Full Code  Family Communication: Unable to reach husband  Disposition Plan:     Consultants:   Palliative care  Procedures:        Subjective: Speaking but not making sense  Objective: Vitals:   01/30/16 0432 01/30/16 0600 01/30/16 0700 01/30/16 0800  BP: (!) 168/127 104/62    Pulse:  92 91 99  Resp:  (!) 24 (!) 29 (!) 26  Temp:    97.5 F (36.4 C)  TempSrc:    Axillary  SpO2:  100% 100% 100%  Weight:      Height:        Intake/Output Summary (Last 24 hours) at 01/30/16 1204 Last data filed at 01/30/16 1024  Gross per 24 hour  Intake          1168.75 ml  Output  2055 ml  Net          -886.25 ml   Filed Weights   01/29/16 0245  Weight: 39.5 kg (87 lb 1.3 oz)    Examination:  General exam: Appears calm and comfortable  Respiratory system: Clear to auscultation. Respiratory effort normal. Cardiovascular system: S1 & S2 heard, RRR. No JVD, murmurs, rubs, gallops or clicks. No pedal edema. Gastrointestinal system: Abdomen is nondistended, soft and nontender. No organomegaly or masses felt. Normal bowel sounds heard. Central nervous system: followed commands for nursing     Data Reviewed: I have  personally reviewed following labs and imaging studies  CBC:  Recent Labs Lab 01/28/16 2005 01/30/16 0326  WBC 4.8 6.6  HGB 8.8* 7.0*  HCT 25.7* 20.5*  MCV 74.9* 73.5*  PLT 214 161   Basic Metabolic Panel:  Recent Labs Lab 01/28/16 2005 01/29/16 0535 01/30/16 0326  NA 145 145 147*  K 2.4* 3.0* 2.6*  CL 107 114* 116*  CO2 18* 14* 16*  GLUCOSE 163* 83 88  BUN 28* 25* 17  CREATININE 2.35* 2.08* 1.74*  CALCIUM 9.2 8.0* 7.6*  MG 2.2 2.1  --    GFR: Estimated Creatinine Clearance: 17.2 mL/min (by C-G formula based on SCr of 1.74 mg/dL). Liver Function Tests:  Recent Labs Lab 01/28/16 2005 01/29/16 0535 01/30/16 0326  AST 16 21 14*  ALT 7* 8* 7*  ALKPHOS 85 76 75  BILITOT 1.5* 1.8* 1.4*  PROT 7.0 5.9* 5.6*  ALBUMIN 3.5 3.0* 2.7*    Recent Labs Lab 01/28/16 2005  LIPASE 28   No results for input(s): AMMONIA in the last 168 hours. Coagulation Profile: No results for input(s): INR, PROTIME in the last 168 hours. Cardiac Enzymes:  Recent Labs Lab 01/29/16 0239 01/29/16 1611 01/29/16 2345 01/30/16 0326  TROPONINI 0.03* 0.03* 0.03* 0.03*   BNP (last 3 results) No results for input(s): PROBNP in the last 8760 hours. HbA1C: No results for input(s): HGBA1C in the last 72 hours. CBG:  Recent Labs Lab 01/29/16 1633 01/29/16 2037 01/30/16 0002 01/30/16 0400 01/30/16 0753  GLUCAP 95 87 76 87 95   Lipid Profile: No results for input(s): CHOL, HDL, LDLCALC, TRIG, CHOLHDL, LDLDIRECT in the last 72 hours. Thyroid Function Tests:  Recent Labs  01/29/16 0535  TSH 0.947   Anemia Panel: No results for input(s): VITAMINB12, FOLATE, FERRITIN, TIBC, IRON, RETICCTPCT in the last 72 hours. Urine analysis:    Component Value Date/Time   COLORURINE YELLOW 01/28/2016 1901   APPEARANCEUR CLOUDY (A) 01/28/2016 1901   LABSPEC 1.015 01/28/2016 1901   PHURINE 5.5 01/28/2016 1901   GLUCOSEU NEGATIVE 01/28/2016 1901   GLUCOSEU NEGATIVE 06/06/2007 1004   HGBUR  NEGATIVE 01/28/2016 1901   BILIRUBINUR SMALL (A) 01/28/2016 1901   KETONESUR NEGATIVE 01/28/2016 1901   PROTEINUR NEGATIVE 01/28/2016 1901   UROBILINOGEN 0.2 mg/dL 40/98/1191 4782   NITRITE NEGATIVE 01/28/2016 1901   LEUKOCYTESUR NEGATIVE 01/28/2016 1901   ) Recent Results (from the past 240 hour(s))  MRSA PCR Screening     Status: None   Collection Time: 01/29/16  2:50 AM  Result Value Ref Range Status   MRSA by PCR NEGATIVE NEGATIVE Final    Comment:        The GeneXpert MRSA Assay (FDA approved for NASAL specimens only), is one component of a comprehensive MRSA colonization surveillance program. It is not intended to diagnose MRSA infection nor to guide or monitor treatment for MRSA infections.  Anti-infectives    Start     Dose/Rate Route Frequency Ordered Stop   01/30/16 1200  fluconazole (DIFLUCAN) IVPB 100 mg     100 mg 50 mL/hr over 60 Minutes Intravenous Every 24 hours 01/30/16 0952     01/29/16 1400  piperacillin-tazobactam (ZOSYN) IVPB 2.25 g     2.25 g 100 mL/hr over 30 Minutes Intravenous Every 6 hours 01/29/16 0705     01/29/16 0715  piperacillin-tazobactam (ZOSYN) IVPB 2.25 g     2.25 g 100 mL/hr over 30 Minutes Intravenous STAT 01/29/16 0704 01/29/16 0757       Radiology Studies: Ct Abdomen Pelvis Wo Contrast  Result Date: 01/29/2016 CLINICAL DATA:  70 year old female with vomiting and weight loss EXAM: CT ABDOMEN AND PELVIS WITHOUT CONTRAST TECHNIQUE: Multidetector CT imaging of the abdomen and pelvis was performed following the standard protocol without IV contrast. COMPARISON:  Chest CT dated 12/19/2015 and CT of the abdomen pelvis dated 10/03/2015 FINDINGS: Evaluation of this exam is limited in the absence of intravenous contrast. Bibasilar linear atelectasis/ scarring. Small focal nodularity at the lung bases, likely scarring. There is coronary vascular calcification. No intra-abdominal free air or free fluid. Small left hepatic hypodense  lesions measuring up to 12 mm are not well characterized but may represent cysts or hemangioma. These appear stable compared to the prior study. An ill-defined 1 cm hypodense lesion is also noted in the dome of the liver (series 2, image 8) which is also incompletely characterized on this noncontrast CT. MRI may provide better characterization. The gallbladder pancreas, spleen, adrenal glands, kidneys, visualized ureters, and urinary bladder appear unremarkable. A Foley catheter is noted within the urinary bladder. The uterus is grossly unremarkable and a calcified posterior uterine fibroid noted. The ovaries are grossly unremarkable as well. Evaluation of the bowel is limited in the absence of oral contrast. There is no evidence of bowel obstruction or active inflammation. Normal appendix. There is moderate aortoiliac atherosclerotic disease. Evaluation of the vasculature is limited in the absence of intravenous contrast. No portal venous gas identified. There is no adenopathy. The abdominal wall soft tissues appear unremarkable there is osteopenia with degenerative changes of the spine and hips. Grade 1 L4-L5 anterolisthesis. No acute fracture. IMPRESSION: No acute intra-abdominal or pelvic pathology. Multiple small hepatic hypodense lesions, incompletely characterized but appears stable compared the prior study. MRI may provide better characterization if clinically indicated. Electronically Signed   By: Elgie Collard M.D.   On: 01/29/2016 03:41  Ct Head Wo Contrast  Result Date: 01/29/2016 CLINICAL DATA:  70 year old female with acute encephalopathy. History of hypertension and CVA and dementia. EXAM: CT HEAD WITHOUT CONTRAST TECHNIQUE: Contiguous axial images were obtained from the base of the skull through the vertex without intravenous contrast. COMPARISON:  Brain MRI dated 07/21/2012 FINDINGS: Evaluation is limited due to motion artifact. There is moderate age-related atrophy and chronic microvascular  ischemic changes. A focal area of low attenuation in the left frontal periventricular white matter and centrum semiovale (series 3, image 20) likely sequela of chronic microvascular changes. There is a focal area of old infarct and encephalomalacia in the right side of the pons. There is no acute intracranial hemorrhage. No mass effect or midline shift noted. The visualized paranasal sinuses and mastoid air cells are clear. The calvarium is intact. IMPRESSION: No acute intracranial hemorrhage. Age-related atrophy and chronic microvascular ischemic disease. Old pontine infarct. If symptoms persist and there are no contraindications, MRI may provide better evaluation if clinically indicated. Electronically  Signed   By: Elgie Collard M.D.   On: 01/29/2016 05:21  Dg Abd Acute W/chest  Result Date: 01/28/2016 CLINICAL DATA:  Increased weakness, weight loss. EXAM: DG ABDOMEN ACUTE W/ 1V CHEST COMPARISON:  Chest radiograph - 10/08/2015; 10/01/2015; chest CT- 12/20/2015; CT the chest, abdomen pelvis - 10/03/2015 FINDINGS: Grossly unchanged cardiac silhouette and mediastinal contours. The lungs remain hyperexpanded with flattening of the diaphragms and thinning of the biapical pulmonary parenchyma. Interval decrease in size of previously noted left lower lung cavitary pneumonia with residual approximately 2.8 x 2.0 cm nodular opacity overlying the left mid lung. Grossly unchanged right basilar linear heterogeneous opacities favored to represent atelectasis scar. No new focal airspace opacities. No pleural effusion or pneumothorax. No evidence of edema. Nonobstructive bowel gas pattern. No pneumoperitoneum, pneumatosis or portal venous gas. Calcifications overlying the lower pelvis are favored to represent a combination of phleboliths and partially calcified degenerating fibroids. No acute or aggressive osseous abnormalities. IMPRESSION: 1. Suspected improved cavitary left mid lung pneumonia with residual approximately  2.8 cm nodular airspace opacity. No new focal airspace opacities to suggest new or worsening pneumonia. A follow-up chest radiograph in 3 to 4 weeks after treatment is recommended to ensure resolution. 2. Nonobstructive bowel gas pattern. Electronically Signed   By: Simonne Come M.D.   On: 01/28/2016 22:08       Scheduled Meds: . antiseptic oral rinse  7 mL Mouth Rinse BID  . aspirin  300 mg Rectal Daily  . fluconazole (DIFLUCAN) IV  100 mg Intravenous Q24H  . heparin  5,000 Units Subcutaneous Q8H  . insulin aspart  0-9 Units Subcutaneous Q4H  . piperacillin-tazobactam (ZOSYN)  IV  2.25 g Intravenous Q6H  . potassium chloride  40 mEq Oral Once   Continuous Infusions: . sodium chloride 75 mL/hr (01/30/16 0745)     LOS: 1 day    Time spent: 35 min    Moe Graca U Elim Peale, DO [facility] Pager [phone]  If 7PM-7AM, please contact night-coverage [email or web address] Password [facility] 01/30/2016, 12:04 PM

## 2016-01-30 NOTE — Progress Notes (Signed)
Date:  January 30, 2016 Chart reviewed for concurrent status and case management needs. Will continue to follow the patient for changes and needs: Marcelle Smiling, BSN, [place], [place]   [phone]

## 2016-01-30 NOTE — Progress Notes (Signed)
Spoke with patient's son by phone. He understands how serious her condition is and that she will likely not get better. We are having trouble getting in touch with Mr. Steeb to schedule a meeting. We have agreed to tentatively meet tomorrow 7/28 at 1030AM to discuss goals of care.  Anderson Malta, DO Palliative Medicine [phone]

## 2016-01-30 NOTE — Evaluation (Signed)
Physical Therapy Evaluation Patient Details Name: Lisa Shaffer MRN: [id number] DOB: 07-Sep-1945 Today's Date: 01/30/2016   History of Present Illness  70 y.o. female with stroke with left-sided hemiplegia, dementia, diabetes mellitus, hypertension, chronic anemia and recently diagnosed left lung abscess and admitted for Acute renal failure with metabolic acidosis   Clinical Impression  Pt admitted with above diagnosis. Pt currently with functional limitations due to the deficits listed below (see PT Problem List).  Pt will benefit from skilled PT to increase their independence and safety with mobility to allow discharge to the venue listed below.  Pt requiring total assist for bed mobility.  No family present.  Pt will likely require increased assist upon d/c however uncertain how much assist spouse has been or is able to provide.        Follow Up Recommendations Supervision/Assistance - 24 hour (may require more care then spouse able to provide, uncertain if pt would be able to participate in rehab due to severe dementia)    Equipment Recommendations  Hospital bed    Recommendations for Other Services       Precautions / Restrictions Precautions Precautions: Fall      Mobility  Bed Mobility Overal bed mobility: +2 for physical assistance;Needs Assistance Bed Mobility: Supine to Sit;Sit to Supine;Rolling Rolling: Total assist;+2 for physical assistance   Supine to sit: Total assist;+2 for physical assistance Sit to supine: Total assist;+2 for physical assistance   General bed mobility comments: pt initiating movement however unable to physically assist with mobility, requiring total assist at this time  Transfers                    Ambulation/Gait                Stairs            Wheelchair Mobility    Modified Rankin (Stroke Patients Only)       Balance Overall balance assessment: Needs assistance Sitting-balance support: Single extremity  supported;Feet supported Sitting balance-Leahy Scale: Zero Sitting balance - Comments: poor trunk control, mostly leans forward                                     Pertinent Vitals/Pain Pain Assessment: 0-10 Faces Pain Scale: No hurt    Home Living Family/patient expects to be discharged to:: Unsure Living Arrangements: Spouse/significant other                    Prior Function Level of Independence: Needs assistance         Comments: pt poor historian and no family present, w/c in room     Hand Dominance        Extremity/Trunk Assessment   Upper Extremity Assessment: LUE deficits/detail       LUE Deficits / Details: hx L hemiplegia   Lower Extremity Assessment: Generalized weakness;LLE deficits/detail   LLE Deficits / Details: hx L hemiplegia     Communication   Communication: Other (comment) (receptive and expressive difficulties likely from dementia)  Cognition Arousal/Alertness: Awake/alert Behavior During Therapy: Flat affect Overall Cognitive Status: No family/caregiver present to determine baseline cognitive functioning (hx dementia) Area of Impairment: Following commands;Problem solving       Following Commands: Follows one step commands inconsistently     Problem Solving: Slow processing;Requires tactile cues;Requires verbal cues      General Comments  Exercises        Assessment/Plan    PT Assessment Patient needs continued PT services  PT Diagnosis Generalized weakness   PT Problem List Decreased strength;Decreased activity tolerance;Decreased mobility;Decreased balance;Decreased cognition  PT Treatment Interventions DME instruction;Therapeutic exercise;Wheelchair mobility training;Patient/family education;Therapeutic activities;Functional mobility training;Balance training   PT Goals (Current goals can be found in the Care Plan section) Acute Rehab PT Goals PT Goal Formulation: Patient unable to participate  in goal setting    Frequency Min 2X/week   Barriers to discharge        Co-evaluation               End of Session   Activity Tolerance: Patient tolerated treatment well Patient left: in bed;with call bell/phone within reach;with bed alarm set           Time: 3009-2330 PT Time Calculation (min) (ACUTE ONLY): 12 min   Charges:   PT Evaluation $PT Eval Moderate Complexity: 1 Procedure     PT G Codes:        Johntavious Francom,KATHrine E 01/30/2016, 12:05 PM Zenovia Jarred, PT, DPT 01/30/2016 Pager: [phone]

## 2016-01-30 NOTE — CV Procedure (Signed)
History: Suspected episodes of seizure  Sedation: Unknown  Technique: This is a 21 channel routine scalp EEG performed at the bedside with bipolar and monopolar montages arranged in accordance to the international 10/20 system of electrode placement. One channel was dedicated to EKG recording.    Background: The background consists of intermixed alpha and beta activities. There is a well defined posterior dominant rhythm of 4-5 Hz that attenuates with eye opening. Sleep is recorded with normal appearing structures.    EEG Abnormalities: Diffuse slowing  Clinical Interpretation: This an abnormal EEG is recorded in the drowsy states due to moderate background slowing. There was no seizure or seizure predisposition recorded on this study. The presence of moderate slowing is a non-specific finding and is consistent with moderate encephalopathy.   Dr. Rudy Jew. Hilda Blades, MD [facility]

## 2016-01-31 DIAGNOSIS — B370 Candidal stomatitis: Secondary | ICD-10-CM

## 2016-01-31 DIAGNOSIS — I10 Essential (primary) hypertension: Secondary | ICD-10-CM

## 2016-01-31 DIAGNOSIS — B3781 Candidal esophagitis: Secondary | ICD-10-CM

## 2016-01-31 LAB — GLUCOSE, CAPILLARY
GLUCOSE-CAPILLARY: 118 mg/dL — AB (ref 65–99)
GLUCOSE-CAPILLARY: 58 mg/dL — AB (ref 65–99)
GLUCOSE-CAPILLARY: 64 mg/dL — AB (ref 65–99)
GLUCOSE-CAPILLARY: 72 mg/dL (ref 65–99)
GLUCOSE-CAPILLARY: 89 mg/dL (ref 65–99)
Glucose-Capillary: 101 mg/dL — ABNORMAL HIGH (ref 65–99)
Glucose-Capillary: 131 mg/dL — ABNORMAL HIGH (ref 65–99)
Glucose-Capillary: 73 mg/dL (ref 65–99)
Glucose-Capillary: 98 mg/dL (ref 65–99)

## 2016-01-31 LAB — BASIC METABOLIC PANEL
ANION GAP: 8 (ref 5–15)
BUN: 16 mg/dL (ref 6–20)
CHLORIDE: 118 mmol/L — AB (ref 101–111)
CO2: 18 mmol/L — AB (ref 22–32)
CREATININE: 1.54 mg/dL — AB (ref 0.44–1.00)
Calcium: 7.3 mg/dL — ABNORMAL LOW (ref 8.9–10.3)
GFR calc non Af Amer: 33 mL/min — ABNORMAL LOW (ref >60)
GFR, EST AFRICAN AMERICAN: 38 mL/min — AB (ref >60)
Glucose, Bld: 205 mg/dL — ABNORMAL HIGH (ref 65–99)
Potassium: 3.4 mmol/L — ABNORMAL LOW (ref 3.5–5.1)
Sodium: 144 mmol/L (ref 135–145)

## 2016-01-31 LAB — CBC
HEMATOCRIT: 17.7 % — AB (ref 36.0–46.0)
Hemoglobin: 6.2 g/dL — CL (ref 12.0–15.0)
MCH: 25.8 pg — AB (ref 26.0–34.0)
MCHC: 35 g/dL (ref 30.0–36.0)
MCV: 73.8 fL — AB (ref 78.0–100.0)
PLATELETS: 160 10*3/uL (ref 150–400)
RBC: 2.4 MIL/uL — ABNORMAL LOW (ref 3.87–5.11)
RDW: 16.5 % — AB (ref 11.5–15.5)
WBC: 9.1 10*3/uL (ref 4.0–10.5)

## 2016-01-31 LAB — ABO/RH: ABO/RH(D): O POS

## 2016-01-31 LAB — PREPARE RBC (CROSSMATCH)

## 2016-01-31 LAB — PROCALCITONIN: Procalcitonin: 0.52 ng/mL

## 2016-01-31 MED ORDER — DEXTROSE 50 % IV SOLN
25.0000 mL | Freq: Once | INTRAVENOUS | Status: AC
  Administered 2016-01-31: 25 mL via INTRAVENOUS
  Filled 2016-01-31: qty 50

## 2016-01-31 MED ORDER — SODIUM CHLORIDE 0.9 % IV SOLN
Freq: Once | INTRAVENOUS | Status: AC
  Administered 2016-01-31: 10:00:00 via INTRAVENOUS

## 2016-01-31 MED ORDER — DEXTROSE-NACL 5-0.45 % IV SOLN
INTRAVENOUS | Status: DC
  Administered 2016-01-31 – 2016-02-02 (×3): via INTRAVENOUS

## 2016-01-31 NOTE — Progress Notes (Signed)
Hypoglycemic Event  CBG: 58  Treatment: Initiated Hypoglycemia Protocol  Ordered D50 6ml -0441  Symptoms: V/s:Temp.98. BP- 117/77,  pulse rate- 88,  Resp.- 15, O2- 100 Room air Patient is asymtomatic  Follow-up CBG: Time0500 CBG Result:118  Possible Reasons for Event: Patient is NPO  Comments/MD notified: Notify on call provider Dr. Blenda Nicely, [place] H

## 2016-01-31 NOTE — Progress Notes (Signed)
I met with Lisa Shaffer and her son Lisa Shaffer. They are devoted caregivers and want her to be at home for as long as possible- they have had little or no in home support and been struggling to figure out what to do for her for the past three weeks since she has not been eating or drinking well and has been extremely weak. Prior to that she was able to talk, transfer into a wheelchair and go out with her husband - rapid decline-was seen by new PCP but failed to improve.   She has extensive thrush secondary to antibiotics for lung abscess. Weight loss from poor PO intake and the abscess itself. I explained poor prognosis with this and her risk for continued aspiration. She looks much better today however and is making good eye contact and even smiled at me when I entered the room.   1. DNR, they had discussed this on many occasions prior to hospitalization- she has an advance directive but not sure if it can be located.  2. Desires to be at home if they can get good support and help.  3. Treat with oral antibiotics and antifungals- I suspect there is esophageal involvement with the thrush causing her regurgitation  4. SLP consult to provide education to family on aspiration prevention and feeding techniques. No feeding tubes.  5. She is frail and with old stroke deficits and lung abscess would qualify likely for hospice services-will request evaluation and eligibility. Family aware of prognosis <6 months to qualify. Hospice at home when medically ready to leave the hospital.  Lane Hacker, DO Palliative Medicine [phone]  Time: 10:30-11:20 Total 50 min Greater than 50%  of this time was spent counseling and coordinating care related to the above assessment and plan.

## 2016-01-31 NOTE — Progress Notes (Signed)
CRITICAL VALUE ALERT  Critical value received:HGb- 6.2  Date of notification:  01/31/2016  Time of notification: 0600  Critical value read back:  yes  Nurse who received alert: Donette Larry  MD notified (1st page):  Dr. Toniann Fail  Time of first page:  0625  MD notified (2nd page): K.Shorr  Time of second page:0640  Responding MD:K.Shorr  Time MD responded:  [phone]

## 2016-01-31 NOTE — Progress Notes (Signed)
Spoke with spouse on the phone and son at bedside. Discussed hospice options, left list for choice. Family will f/u with CM once they have reviewed list and made a choice.

## 2016-01-31 NOTE — Progress Notes (Addendum)
PROGRESS NOTE    Lisa KINDEL  Shaffer:[id number] DOB: [birth year] DOA: 01/28/2016 PCP: [name], DO   Outpatient Specialists:     Brief Narrative:  Lisa Shaffer is a 70 y.o. female with stroke with left-sided hemiplegia, dementia, diabetes mellitus, hypertension, chronic anemia and recently diagnosed left lung abscess was treated with Augmentin prolonged course and also had bronchoscopy last month was brought to the ER because of poor oral intake over the last few weeks. Patient's husband states patient may have lost considerable weight over the last 2 months. As per the patient's husband patient has not been eating well for last few weeks and particularly last 2 weeks. Patient throws up whenever she eats, denies any diarrhea. Unable to take her medications for at least last 2 weeks. Patient was recently prescribed Megace by patient's primary care physician. Labs revealed acute renal failure with elevated lactate levels and chest x-ray shows improving left midlung zone cavitary lesion pneumonia. Patient is being admitted for acute renal failure probably from dehydration and persistent nausea vomiting.  Family to meet with palliative care on 7/28   Assessment & Plan:   Principal Problem:   Acute renal failure (HCC) Active Problems:   Essential hypertension   Nausea & vomiting   DM2 (diabetes mellitus, type 2) (HCC)   Hypokalemia   Lactic acidosis   ARF (acute renal failure) (HCC)   Pressure ulcer   Palliative care patient   AMS  -? If this is patient's baseline with her dementia  -EEG  -MRI- atrophy, old CVA-- no new findings  Acute renal failure with metabolic acidosis -  - Cr and lactate level was markedly elevated upon admission, much improved now.   Persistent nausea vomiting -  ? 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 -restarted IV reglan  Elevated lactate levels - probably from persistent nausea and vomiting and poor oral intake and dehydration. Continue with hydration  -improved  History of lung abscesses with chest x-ray showing improvement  - Zosyn for now and follow blood cultures.  Diabetes mellitus type 2 - ] -sliding scale coverage. -BS on low end- add d51/2 NS  Hypertension -  -hold PO meds  History of dementia. -appears advances -palliative care consult  Chronic anemia - follow CBC. -transfuse 1 unit -? Volume dilution -no sign of active bleeding -heme test stools  History of stroke with left-sided hemiplegia on aspirin.   Hypokalemia  -replaced IV  -Mg normal  Pressure ulcer - stage II- partial thickness loss of dermia in gluteal region surrounding rectum- present on admission  DVT prophylaxis:    Code Status: Full Code  Family Communication: Husband and son at bedside  Disposition Plan:     Consultants:   Palliative care  Procedures:        Subjective: Family thinks patient has improved slightly from admission but still know overall prognosis is guarded   Objective: Vitals:   01/31/16 0424 01/31/16 0532 01/31/16 1014 01/31/16 1029  BP: 117/77 115/75 125/85 120/83  Pulse: 88 85 85 95  Resp: Temp: 98 F (36.7 C) 97.6 F (36.4 C) 98.2 F (36.8 C) 98.4 F (36.9 C)  TempSrc: Oral Oral Oral Oral  SpO2: 100% 98% 100% 100%  Weight:      Height:        Intake/Output Summary (  Last 24 hours) at 01/31/16 1034 Last data filed at 01/31/16 1014  Gross per 24 hour  Intake             1580 ml  Output              700 ml  Net              880 ml   Filed Weights   01/29/16 0245 01/30/16 2150  Weight: 39.5 kg (87 lb 1.3 oz) 40.6 kg (89 lb 9.6 oz)    Examination:  General exam: Appears calm and comfortable - able to answer most questions today- 1 word Respiratory system: Clear to auscultation.  Respiratory effort normal. Cardiovascular system: S1 & S2 heard, RRR. No JVD, murmurs, rubs, gallops or clicks. No pedal edema. Gastrointestinal system: Abdomen is nondistended, soft and nontender. No organomegaly or masses felt. Normal bowel sounds heard.      Data Reviewed: I have personally reviewed following labs and imaging studies  CBC:  Recent Labs Lab 01/28/16 2005 01/30/16 0326 01/31/16 0442  WBC 4.8 6.6 9.1  HGB 8.8* 7.0* 6.2*  HCT 25.7* 20.5* 17.7*  MCV 74.9* 73.5* 73.8*  PLT 214 161 160   Basic Metabolic Panel:  Recent Labs Lab 01/28/16 2005 01/29/16 0535 01/30/16 0326 01/30/16 1620 01/31/16 0442  NA 145 145 147* 147* 144  K 2.4* 3.0* 2.6* 3.6 3.4*  CL 107 114* 116* 118* 118*  CO2 18* 14* 16* 15* 18*  GLUCOSE 163* 83 88 96 205*  BUN 28* 25* 17 16 16   CREATININE 2.35* 2.08* 1.74* 1.70* 1.54*  CALCIUM 9.2 8.0* 7.6* 7.6* 7.3*  MG 2.2 2.1  --   --   --    GFR: Estimated Creatinine Clearance: 19.5 mL/min (by C-G formula based on SCr of 1.54 mg/dL). Liver Function Tests:  Recent Labs Lab 01/28/16 2005 01/29/16 0535 01/30/16 0326  AST 16 21 14*  ALT 7* 8* 7*  ALKPHOS 85 76 75  BILITOT 1.5* 1.8* 1.4*  PROT 7.0 5.9* 5.6*  ALBUMIN 3.5 3.0* 2.7*    Recent Labs Lab 01/28/16 2005  LIPASE 28   No results for input(s): AMMONIA in the last 168 hours. Coagulation Profile: No results for input(s): INR, PROTIME in the last 168 hours. Cardiac Enzymes:  Recent Labs Lab 01/29/16 0239 01/29/16 1611 01/29/16 2345 01/30/16 0326  TROPONINI 0.03* 0.03* 0.03* 0.03*   BNP (last 3 results) No results for input(s): PROBNP in the last 8760 hours. HbA1C: No results for input(s): HGBA1C in the last 72 hours. CBG:  Recent Labs Lab 01/30/16 2013 01/31/16 0027 01/31/16 0420 01/31/16 0509 01/31/16 0802  GLUCAP 73 72 58* 118* 73   Lipid Profile: No results for input(s): CHOL, HDL, LDLCALC, TRIG, CHOLHDL, LDLDIRECT in the last 72 hours. Thyroid  Function Tests:  Recent Labs  01/29/16 0535  TSH 0.947   Anemia Panel: No results for input(s): VITAMINB12, FOLATE, FERRITIN, TIBC, IRON, RETICCTPCT in the last 72 hours. Urine analysis:    Component Value Date/Time   COLORURINE YELLOW 01/28/2016 1901   APPEARANCEUR CLOUDY (A) 01/28/2016 1901   LABSPEC 1.015 01/28/2016 1901   PHURINE 5.5 01/28/2016 1901   GLUCOSEU NEGATIVE 01/28/2016 1901   GLUCOSEU NEGATIVE 06/06/2007 1004   HGBUR NEGATIVE 01/28/2016 1901   BILIRUBINUR SMALL (A) 01/28/2016 1901   KETONESUR NEGATIVE 01/28/2016 1901   PROTEINUR NEGATIVE 01/28/2016 1901   UROBILINOGEN 0.2 mg/dL 45/40/9811 9147   NITRITE NEGATIVE 01/28/2016 1901   LEUKOCYTESUR NEGATIVE 01/28/2016 1901   )  Recent Results (from the past 240 hour(s))  MRSA PCR Screening     Status: None   Collection Time: 01/29/16  2:50 AM  Result Value Ref Range Status   MRSA by PCR NEGATIVE NEGATIVE Final    Comment:        The GeneXpert MRSA Assay (FDA approved for NASAL specimens only), is one component of a comprehensive MRSA colonization surveillance program. It is not intended to diagnose MRSA infection nor to guide or monitor treatment for MRSA infections.   Culture, blood (routine x 2)     Status: None (Preliminary result)   Collection Time: 01/29/16  8:28 AM  Result Value Ref Range Status   Specimen Description BLOOD RIGHT HAND  Final   Special Requests BOTTLES DRAWN AEROBIC ONLY ARPEDB 4CC  Final   Culture   Final    NO GROWTH 1 DAY Performed at [facility]    Report Status PENDING  Incomplete  Culture, blood (routine x 2)     Status: None (Preliminary result)   Collection Time: 01/29/16  8:29 AM  Result Value Ref Range Status   Specimen Description BLOOD RIGHT ARM  Final   Special Requests BOTTLES DRAWN AEROBIC AND ANAEROBIC 5 CC EACH  Final   Culture   Final    NO GROWTH 1 DAY Performed at [facility]    Report Status PENDING  Incomplete      Anti-infectives     Start     Dose/Rate Route Frequency Ordered Stop   01/30/16 1200  fluconazole (DIFLUCAN) IVPB 100 mg     100 mg 50 mL/hr over 60 Minutes Intravenous Every 24 hours 01/30/16 0952     01/29/16 1400  piperacillin-tazobactam (ZOSYN) IVPB 2.25 g     2.25 g 100 mL/hr over 30 Minutes Intravenous Every 6 hours 01/29/16 0705     01/29/16 0715  piperacillin-tazobactam (ZOSYN) IVPB 2.25 g     2.25 g 100 mL/hr over 30 Minutes Intravenous STAT 01/29/16 0704 01/29/16 0757       Radiology Studies: Mr Brain Wo Contrast  Result Date: 01/30/2016 CLINICAL DATA:  Altered mental status. Left-sided hemiplegia. Diabetes, hypertension, dementia EXAM: MRI HEAD WITHOUT CONTRAST TECHNIQUE: Multiplanar, multiecho pulse sequences of the brain and surrounding structures were obtained without intravenous contrast. COMPARISON:  CT 01/29/2016.  MRI 05/21/2013 FINDINGS: Moderate to advanced atrophy with progression since 2014. Negative for hydrocephalus Negative for acute infarct. Chronic microvascular ischemic changes throughout the white matter with progression since 2014. Large chronic infarct in the right pons has developed since the prior MRI. Chronic ischemia in the brachium pontis and right medulla. Negative for intracranial hemorrhage. Negative for mass or edema.  No shift of the midline structures. Paranasal sinuses clear. No orbital mass lesion. Bilateral cataract extraction. Pituitary not enlarged. IMPRESSION: Progressive atrophy and chronic ischemic change. Large chronic infarct right pons No acute abnormality. Electronically Signed   By: Marlan Palau M.D.   On: 01/30/2016 21:56       Scheduled Meds: . antiseptic oral rinse  7 mL Mouth Rinse BID  . aspirin  300 mg Rectal Daily  . fluconazole (DIFLUCAN) IV  100 mg Intravenous Q24H  . heparin  5,000 Units Subcutaneous Q8H  . insulin aspart  0-9 Units Subcutaneous Q4H  . metoCLOPramide (REGLAN) injection  5 mg Intravenous Q6H  . piperacillin-tazobactam  (ZOSYN)  IV  2.25 g Intravenous Q6H  . potassium chloride  40 mEq Oral Once  . thiamine IV  100 mg Intravenous  Daily   Continuous Infusions: . dextrose 5 % and 0.45% NaCl       LOS: 2 days    Time spent: 25 min    Aime Carreras Juanetta Gosling, DO [facility] Pager [phone]  If 7PM-7AM, please contact night-coverage [email or web address] Password [facility] 01/31/2016, 10:34 AM

## 2016-01-31 NOTE — Progress Notes (Signed)
Nutrition Follow-up  DOCUMENTATION CODES:   Severe malnutrition in context of acute illness/injury  INTERVENTION:  - Diet advancement per GOC. - RD will continue to monitor for needs.  NUTRITION DIAGNOSIS:   Inadequate oral intake related to inability to eat as evidenced by NPO status. -ongoing  GOAL:   Patient will meet greater than or equal to 90% of their needs -unable to meet.  MONITOR:   Diet advancement, Weight trends, Labs, Skin, I & O's  ASSESSMENT:   70 y.o. female with stroke with left-sided hemiplegia, dementia, diabetes mellitus, hypertension, chronic anemia and recently diagnosed left lung abscess was treated with Augmentin prolonged course and also had bronchoscopy last month was brought to the ER because of poor oral intake over the last few weeks. Patient's husband states patient may have lost considerable weight over the last 2 months. As per the patient's husband patient has not been eating well for last few weeks and particularly last 2 weeks. Patient throws up whenever she eats, denies any diarrhea. Unable to take her medications for at least last 2 weeks. Patient was recently prescribed Megace by patient's primary care physician. On my exam patient is not in distress. Patient's abdomen appears benign. Patient has been throwing up after the admission. Labs reveal acute renal failure with elevated lactate levels and chest x-ray shows improving left midlung zone cavitary lesion pneumonia. Patient is being admitted for acute renal failure probably from dehydration and persistent nausea vomiting.   7/28 Pt has remained NPO since admission. Weight up 2 lbs from 7/26-7/27 which is likely related to fluid or difference in scales. Palliative Care following pt and spoke with family ~1 hour ago concerning POC/GOC. Per this, it has been indicated that no feeding tubes are to be placed and that SLP to work with family concerning aspiration prevention and feeding techniques.  No  nutrition-related interventions warranted at this time but will continue to monitor for needs. Medications reviewed; sliding scale Novolog, 5 mg IV Reglan every 6 hours, PRN Zofran, 100 mg IV thiamine/day. Labs reviewed; CBGs: 58 and 118 mg/dL today, K: 3.4 mmol/L, Cl: 118 mmol/L, creatinine: 1.54 mg/dL, Ca: 7.3 mg/dL, GFR: 38 mL/min. IVF: D5-1/2 NS @ 50 mL/hr (204 kcal).   7/26 - Pt has been NPO since admission and unable to meet estimated nutrition needs.  - Pt with hx of dementia and notes indicate that mentation has been declining recently.  - No family/visitors present at bedside at this time.  - Pt is not a good source of information as she nods her head "yes" to all questions asked.  - Information concerning poor appetite, intakes, and intermittent vomiting (especially with forced feedings) outlined in brief summary from H&P above. - Notes also indicate that PCP had prescribed pt Megace recently.  - Physical assessment indicates moderate and severe muscle and moderate and severe fat wasting present, no edema.  - Per chart review, pt has lost 7 lbs (7.4% body weight) in the past 1 month which is significant for time frame.  - SLP saw pt earlier this AM and note shortly after 10 AM states recommendation for NPO status with "consider GI evaluation."  - TF recommendationsshould they be warranted and if nutrition support within GOC: Osmolite 1.2 @ 40 mL/hr which will provide 1152 kcal, 53 grams of protein, and 787 mL free water.   - Will monitor for possible GI note; will monitor if pt's GI functionality would warrant post-pyloric feeds.   IVF: NS @ 100 mL/hr.  Diet Order:  Diet NPO time specified  Skin:  Wound (see comment) (Stage 2 rectal pressure injury)  Last BM:  7/27  Height:   Ht Readings from Last 1 Encounters:  01/29/16  (1.422 m)    Weight:   Wt Readings from Last 1 Encounters:  01/30/16 89 lb 9.6 oz (40.6 kg)    Ideal Body Weight:  40.73 kg  (kg)  BMI:  Body mass index is 20.09 kg/m.  Estimated Nutritional Needs:   Kcal:  1180-1380 (30-35 kcal/kg)  Protein:  45-55 grams/kg  Fluid:  1.4-1.6 L/day  EDUCATION NEEDS:   No education needs identified at this time    Trenton Gammon, MS, RD, LDN Inpatient Clinical Dietitian Pager # [phone] After hours/weekend pager # [phone]

## 2016-01-31 NOTE — Progress Notes (Signed)
Pharmacy Antibiotic Note  Lisa Shaffer is a 70 y.o. female admitted on 01/28/2016 with weight loss, dehydration, AKI, lactic acidosis, but initially no source of infection noted (CXR with existing but stable cavitary lesion, UA w/o sign of infection, CT abdomen w/o acute process).  She is now suspected of aspiration pneumonia.  Pharmacy has been consulted for Zosyn dosing on 7/26  Plan:  Renal function has improved but est CrCl still slightly less than 20 ml/min at 19.5 ml/min so will continue Zosyn 2.25g IV q6h  Follow up renal fxn, culture results, and clinical course.   Height: 4\' 8"  (142.2 cm) Weight: 89 lb 9.6 oz (40.6 kg) IBW/kg (Calculated) : 36.3  Temp (24hrs), Avg:97.9 F (36.6 C), Min:97.4 F (36.3 C), Max:98.4 F (36.9 C)   Recent Labs Lab 01/28/16 2005 01/28/16 2216 01/29/16 0122 01/29/16 0239 01/29/16 0535 01/30/16 0326 01/30/16 1620 01/31/16 0442  WBC 4.8  --   --   --   --  6.6  --  9.1  CREATININE 2.35*  --   --   --  2.08* 1.74* 1.70* 1.54*  LATICACIDVEN  --  5.64* 4.93* 4.3* 3.8* 1.8  --   --     Estimated Creatinine Clearance: 19.5 mL/min (by C-G formula based on SCr of 1.54 mg/dL).    No Known Allergies  Antimicrobials this admission: 7/26 Zosyn >> 7/27 Diflucan for thrush >>  Dose adjustments this admission:   Microbiology results: 7/26 BCx: ngtd 7/26 MRSA PCR: negative  Thank you for allowing pharmacy to be a part of this patient's care.   Hessie Knows, PharmD, BCPS Pager [phone] 01/31/2016 10:39 AM

## 2016-02-01 LAB — GLUCOSE, CAPILLARY
GLUCOSE-CAPILLARY: 95 mg/dL (ref 65–99)
GLUCOSE-CAPILLARY: 97 mg/dL (ref 65–99)
GLUCOSE-CAPILLARY: 103 mg/dL — AB (ref 65–99)
GLUCOSE-CAPILLARY: 125 mg/dL — AB (ref 65–99)
Glucose-Capillary: 160 mg/dL — ABNORMAL HIGH (ref 65–99)
Glucose-Capillary: 136 mg/dL — ABNORMAL HIGH (ref 65–99)

## 2016-02-01 LAB — CBC
HEMATOCRIT: 24.3 % — AB (ref 36.0–46.0)
HEMOGLOBIN: 8.5 g/dL — AB (ref 12.0–15.0)
MCH: 26.6 pg (ref 26.0–34.0)
MCHC: 35 g/dL (ref 30.0–36.0)
MCV: 75.9 fL — ABNORMAL LOW (ref 78.0–100.0)
Platelets: 156 10*3/uL (ref 150–400)
RBC: 3.2 MIL/uL — ABNORMAL LOW (ref 3.87–5.11)
RDW: 16.6 % — AB (ref 11.5–15.5)
WBC: 9.6 10*3/uL (ref 4.0–10.5)

## 2016-02-01 LAB — BASIC METABOLIC PANEL
ANION GAP: 9 (ref 5–15)
BUN: 10 mg/dL (ref 6–20)
CALCIUM: 7.7 mg/dL — AB (ref 8.9–10.3)
CO2: 19 mmol/L — AB (ref 22–32)
Chloride: 111 mmol/L (ref 101–111)
Creatinine, Ser: 1.41 mg/dL — ABNORMAL HIGH (ref 0.44–1.00)
GFR calc non Af Amer: 37 mL/min — ABNORMAL LOW (ref >60)
GFR, EST AFRICAN AMERICAN: 43 mL/min — AB (ref >60)
Glucose, Bld: 89 mg/dL (ref 65–99)
Potassium: 4.5 mmol/L (ref 3.5–5.1)
Sodium: 139 mmol/L (ref 135–145)

## 2016-02-01 MED ORDER — BOOST / RESOURCE BREEZE PO LIQD
1.0000 | Freq: Three times a day (TID) | ORAL | Status: DC
  Administered 2016-02-01 – 2016-02-04 (×5): 1 via ORAL

## 2016-02-01 MED ORDER — PIPERACILLIN-TAZOBACTAM 3.375 G IVPB
3.3750 g | Freq: Three times a day (TID) | INTRAVENOUS | Status: DC
  Administered 2016-02-01 – 2016-02-02 (×3): 3.375 g via INTRAVENOUS
  Filled 2016-02-01 (×3): qty 50
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
## 2016-02-01 NOTE — Progress Notes (Signed)
PROGRESS NOTE    Lisa Shaffer  JTT:[id number] DOB: June 30, 1946 DOA: 01/28/2016 PCP: Drema Pry, DO   Outpatient Specialists:     Brief Narrative:  Lisa Shaffer is a 70 y.o. female with stroke with left-sided hemiplegia, dementia, diabetes mellitus, hypertension, chronic anemia and recently diagnosed left lung abscess was treated with Augmentin prolonged course and also had bronchoscopy last month was brought to the ER because of poor oral intake over the last few weeks. Patient's husband states patient may have lost considerable weight over the last 2 months. As per the patient's husband patient has not been eating well for last few weeks and particularly last 2 weeks. Patient throws up whenever she eats, denies any diarrhea. Unable to take her medications for at least last 2 weeks. Patient was recently prescribed Megace by patient's primary care physician. Labs revealed acute renal failure with elevated lactate levels and chest x-ray shows improving left midlung zone cavitary lesion pneumonia. Patient is being admitted for acute renal failure probably from dehydration and persistent nausea vomiting.  Family to met with palliative care on 7/28- plan to take home with hospice if able to eat/get support.   Assessment & Plan:   Principal Problem:   Acute renal failure (HCC) Active Problems:   Essential hypertension   Nausea & vomiting   DM2 (diabetes mellitus, type 2) (HCC)   Hypokalemia   Lactic acidosis   ARF (acute renal failure) (HCC)   Pressure ulcer   Palliative care patient   Thrush   Thrush of mouth and esophagus ([place])   AMS  -appears resolved  -EEG- negative  -MRI- atrophy, old CVA-- no new findings  Acute renal failure with metabolic acidosis -  - Cr and lactate level was markedly elevated upon admission, much improved now.  -D51/2 NS until able to eat  Persistent nausea vomiting -  ? Thrush- start diflucan -during last admission thought to be diabetic  gastroparesis -LFTs not elevated  -CT scan shows: Multiple small hepatic hypodense lesions, incompletely characterized but appears stable compared the prior study. -restarted IV reglan -ask SLP to re-eval  Elevated lactate levels - probably from persistent nausea and vomiting and poor oral intake and dehydration. Continue with hydration  -improved  History of lung abscesses with chest x-ray showing improvement  - Zosyn for now and follow blood cultures.  Diabetes mellitus type 2 -  -sliding scale coverage. -BS on low end- add d51/2 NS  Hypertension -  -hold PO meds  History of dementia. -appears advances -palliative care consult  Chronic anemia - follow CBC. -transfuse 1 unit -? Volume dilution -no sign of active bleeding -heme test stools  History of stroke with left-sided hemiplegia on aspirin.   Hypokalemia  -replaced IV  -Mg normal  Pressure ulcer - stage II- partial thickness loss of dermia in gluteal region surrounding rectum- present on admission  DVT prophylaxis:    Code Status: Full Code  Family Communication: Husband and son at bedside 7/28  Disposition Plan:     Consultants:   Palliative care  Procedures:        Subjective: Answers simple questions with 1 word answers No overnight events    Objective: Vitals:   01/31/16 1200 01/31/16 1436 01/31/16 2051 02/01/16 0120  BP: 134/80 (!) 131/93 131/85   Pulse: 83 85 87   Resp: _0 Temp: 97.9 F (36.6 C) 98 F (36.7 C) 98.8 F (37.1 C)   TempSrc: Oral Oral Oral   SpO2:  100% 100% 100%   Weight:    42.3 kg (93 lb 3.2 oz)  Height:        Intake/Output Summary (Last 24 hours) at 02/01/16 0924 Last data filed at 02/01/16 0600  Gross per 24 hour  Intake            887.5 ml  Output              950 ml  Net            -62.5 ml   Filed Weights   01/29/16 0245 01/30/16 2150 02/01/16 0120  Weight: 39.5 kg (87 lb 1.3 oz) 40.6 kg (89 lb 9.6 oz) 42.3 kg (93 lb 3.2 oz)     Examination:  General exam: Appears calm and comfortable - able to answer questions today- 1 word still-- similar mentation to yesterday Respiratory system: Clear to auscultation. Respiratory effort normal. Cardiovascular system: S1 & S2 heard, RRR. No JVD, murmurs, rubs, gallops or clicks. No pedal edema. Gastrointestinal system: Abdomen is nondistended, soft and nontender. No organomegaly or masses felt. Normal bowel sounds heard.      Data Reviewed: I have personally reviewed following labs and imaging studies  CBC:  Recent Labs Lab 01/28/16 2005 01/30/16 0326 01/31/16 0442 02/01/16 0753  WBC 4.8 6.6 9.1 9.6  HGB 8.8* 7.0* 6.2* 8.5*  HCT 25.7* 20.5* 17.7* 24.3*  MCV 74.9* 73.5* 73.8* 75.9*  PLT 214 161 160 748   Basic Metabolic Panel:  Recent Labs Lab 01/28/16 2005 01/29/16 0535 01/30/16 0326 01/30/16 1620 01/31/16 0442 02/01/16 0753  NA 145 145 147* 147* 144 139  K 2.4* 3.0* 2.6* 3.6 3.4* 4.5  CL 107 114* 116* 118* 118* 111  CO2 18* 14* 16* 15* 18* 19*  GLUCOSE 163* 83 88 96 205* 89  BUN 28* 25* _0 CREATININE 2.35* 2.08* 1.74* 1.70* 1.54* 1.41*  CALCIUM 9.2 8.0* 7.6* 7.6* 7.3* 7.7*  MG 2.2 2.1  --   --   --   --    GFR: Estimated Creatinine Clearance: 21.3 mL/min (by C-G formula based on SCr of 1.41 mg/dL). Liver Function Tests:  Recent Labs Lab 01/28/16 2005 01/29/16 0535 01/30/16 0326  AST 16 21 14*  ALT 7* 8* 7*  ALKPHOS 85 76 75  BILITOT 1.5* 1.8* 1.4*  PROT 7.0 5.9* 5.6*  ALBUMIN 3.5 3.0* 2.7*    Recent Labs Lab 01/28/16 2005  LIPASE 28   No results for input(s): AMMONIA in the last 168 hours. Coagulation Profile: No results for input(s): INR, PROTIME in the last 168 hours. Cardiac Enzymes:  Recent Labs Lab 01/29/16 0239 01/29/16 1611 01/29/16 2345 01/30/16 0326  TROPONINI 0.03* 0.03* 0.03* 0.03*   BNP (last 3 results) No results for input(s): PROBNP in the last 8760 hours. HbA1C: No results for input(s):  HGBA1C in the last 72 hours. CBG:  Recent Labs Lab 01/31/16 1709 01/31/16 2048 01/31/16 2344 02/01/16 0359 02/01/16 0803  GLUCAP 101* 89 98 95 97   Lipid Profile: No results for input(s): CHOL, HDL, LDLCALC, TRIG, CHOLHDL, LDLDIRECT in the last 72 hours. Thyroid Function Tests: No results for input(s): TSH, T4TOTAL, FREET4, T3FREE, THYROIDAB in the last 72 hours. Anemia Panel: No results for input(s): VITAMINB12, FOLATE, FERRITIN, TIBC, IRON, RETICCTPCT in the last 72 hours. Urine analysis:    Component Value Date/Time   COLORURINE YELLOW 01/28/2016 1901   APPEARANCEUR CLOUDY (A) 01/28/2016 1901   LABSPEC 1.015 01/28/2016 1901   PHURINE 5.5  01/28/2016 1901   GLUCOSEU NEGATIVE 01/28/2016 1901   GLUCOSEU NEGATIVE 06/06/2007 1004   HGBUR NEGATIVE 01/28/2016 1901   BILIRUBINUR SMALL (A) 01/28/2016 1901   KETONESUR NEGATIVE 01/28/2016 1901   PROTEINUR NEGATIVE 01/28/2016 1901   UROBILINOGEN 0.2 mg/dL 06/06/2007 1004   NITRITE NEGATIVE 01/28/2016 1901   LEUKOCYTESUR NEGATIVE 01/28/2016 1901   ) Recent Results (from the past 240 hour(s))  MRSA PCR Screening     Status: None   Collection Time: 01/29/16  2:50 AM  Result Value Ref Range Status   MRSA by PCR NEGATIVE NEGATIVE Final    Comment:        The GeneXpert MRSA Assay (FDA approved for NASAL specimens only), is one component of a comprehensive MRSA colonization surveillance program. It is not intended to diagnose MRSA infection nor to guide or monitor treatment for MRSA infections.   Culture, blood (routine x 2)     Status: None (Preliminary result)   Collection Time: 01/29/16  8:28 AM  Result Value Ref Range Status   Specimen Description BLOOD RIGHT HAND  Final   Special Requests BOTTLES DRAWN AEROBIC ONLY ARPEDB 4CC  Final   Culture   Final    NO GROWTH 2 DAYS Performed at [facility]    Report Status PENDING  Incomplete  Culture, blood (routine x 2)     Status: None (Preliminary result)    Collection Time: 01/29/16  8:29 AM  Result Value Ref Range Status   Specimen Description BLOOD RIGHT ARM  Final   Special Requests BOTTLES DRAWN AEROBIC AND ANAEROBIC 5 CC EACH  Final   Culture   Final    NO GROWTH 2 DAYS Performed at [facility]    Report Status PENDING  Incomplete      Anti-infectives    Start     Dose/Rate Route Frequency Ordered Stop   01/30/16 1200  fluconazole (DIFLUCAN) IVPB 100 mg     100 mg 50 mL/hr over 60 Minutes Intravenous Every 24 hours 01/30/16 0952     01/29/16 1400  piperacillin-tazobactam (ZOSYN) IVPB 2.25 g     2.25 g 100 mL/hr over 30 Minutes Intravenous Every 6 hours 01/29/16 0705     01/29/16 0715  piperacillin-tazobactam (ZOSYN) IVPB 2.25 g     2.25 g 100 mL/hr over 30 Minutes Intravenous STAT 01/29/16 0704 01/29/16 0757       Radiology Studies: Mr Brain Wo Contrast  Result Date: 01/30/2016 CLINICAL DATA:  Altered mental status. Left-sided hemiplegia. Diabetes, hypertension, dementia EXAM: MRI HEAD WITHOUT CONTRAST TECHNIQUE: Multiplanar, multiecho pulse sequences of the brain and surrounding structures were obtained without intravenous contrast. COMPARISON:  CT 01/29/2016.  MRI 05/21/2013 FINDINGS: Moderate to advanced atrophy with progression since 2014. Negative for hydrocephalus Negative for acute infarct. Chronic microvascular ischemic changes throughout the white matter with progression since 2014. Large chronic infarct in the right pons has developed since the prior MRI. Chronic ischemia in the brachium pontis and right medulla. Negative for intracranial hemorrhage. Negative for mass or edema.  No shift of the midline structures. Paranasal sinuses clear. No orbital mass lesion. Bilateral cataract extraction. Pituitary not enlarged. IMPRESSION: Progressive atrophy and chronic ischemic change. Large chronic infarct right pons No acute abnormality. Electronically Signed   By: Franchot Gallo M.D.   On: 01/30/2016  21:56       Scheduled Meds: . antiseptic oral rinse  7 mL Mouth Rinse BID  . aspirin  300 mg Rectal Daily  . fluconazole (DIFLUCAN)  IV  100 mg Intravenous Q24H  . heparin  5,000 Units Subcutaneous Q8H  . insulin aspart  0-9 Units Subcutaneous Q4H  . metoCLOPramide (REGLAN) injection  5 mg Intravenous Q6H  . piperacillin-tazobactam (ZOSYN)  IV  2.25 g Intravenous Q6H  . potassium chloride  40 mEq Oral Once  . thiamine IV  100 mg Intravenous Daily   Continuous Infusions: . dextrose 5 % and 0.45% NaCl 50 mL/hr at 01/31/16 1208     LOS: 3 days    Time spent: 25 min    [place], DO [facility] Pager [phone]  If 7PM-7AM, please contact night-coverage [email or web address] Password [facility] 02/01/2016, 9:24 AM

## 2016-02-01 NOTE — Progress Notes (Signed)
Pharmacy Antibiotic Note  Lisa Shaffer is a 70 y.o. female admitted on 01/28/2016 with weight loss, dehydration, AKI, lactic acidosis, but initially no source of infection noted (CXR with existing but stable cavitary lesion, UA w/o sign of infection, CT abdomen w/o acute process).  She is now suspected of aspiration pneumonia.  Pharmacy has been consulted for Zosyn dosing on 7/26  D4 Abx  Plan:  Change to Zosyn 3.375g IV q8h (infuse over 4 hours) for continued improving renal function  Follow up renal fxn, culture results, and clinical course.   Height: 4\' 8"  (142.2 cm) Weight: 93 lb 3.2 oz (42.3 kg) IBW/kg (Calculated) : 36.3  Temp (24hrs), Avg:98.3 F (36.8 C), Min:97.9 F (36.6 C), Max:98.8 F (37.1 C)   Recent Labs Lab 01/28/16 2005 01/28/16 2216 01/29/16 0122 01/29/16 0239 01/29/16 0535 01/30/16 0326 01/30/16 1620 01/31/16 0442 02/01/16 0753  WBC 4.8  --   --   --   --  6.6  --  9.1 9.6  CREATININE 2.35*  --   --   --  2.08* 1.74* 1.70* 1.54* 1.41*  LATICACIDVEN  --  5.64* 4.93* 4.3* 3.8* 1.8  --   --   --     Estimated Creatinine Clearance: 21.3 mL/min (by C-G formula based on SCr of 1.41 mg/dL).    No Known Allergies  Antimicrobials this admission: 7/26 Zosyn >> 7/27 Diflucan for thrush >>  Dose adjustments this admission:   Microbiology results: 7/26 BCx: ngtd 7/26 MRSA PCR: negative  Thank you for allowing pharmacy to be a part of this patient's care.  Haynes Hoehn, PharmD, BCPS 02/01/2016, 10:29 AM  Pager: [phone]

## 2016-02-02 LAB — GLUCOSE, CAPILLARY
GLUCOSE-CAPILLARY: 100 mg/dL — AB (ref 65–99)
GLUCOSE-CAPILLARY: 108 mg/dL — AB (ref 65–99)
GLUCOSE-CAPILLARY: 194 mg/dL — AB (ref 65–99)
Glucose-Capillary: 58 mg/dL — ABNORMAL LOW (ref 65–99)
Glucose-Capillary: 130 mg/dL — ABNORMAL HIGH (ref 65–99)
Glucose-Capillary: 85 mg/dL (ref 65–99)
Glucose-Capillary: 89 mg/dL (ref 65–99)

## 2016-02-02 LAB — PROCALCITONIN: PROCALCITONIN: 0.17 ng/mL

## 2016-02-02 MED ORDER — DEXTROSE 50 % IV SOLN
INTRAVENOUS | Status: AC
  Administered 2016-02-02: 25 mL via INTRAVENOUS
  Filled 2016-02-02: qty 50

## 2016-02-02 MED ORDER — FLUCONAZOLE 100 MG PO TABS
100.0000 mg | ORAL_TABLET | Freq: Every day | ORAL | Status: DC
  Administered 2016-02-02 – 2016-02-03 (×2): 100 mg via ORAL
  Filled 2016-02-02 (×3): qty 1

## 2016-02-02 MED ORDER — DEXTROSE 50 % IV SOLN
25.0000 mL | Freq: Once | INTRAVENOUS | Status: AC
Start: 2016-02-02 — End: 2016-02-02
  Administered 2016-02-02: 25 mL via INTRAVENOUS
  Filled 2016-02-02: qty 50

## 2016-02-02 NOTE — Progress Notes (Addendum)
PROGRESS NOTE    Lisa Shaffer  KPV:[id number] DOB: 22-Sep-1945 DOA: 01/28/2016 PCP: Drema Pry, DO   Outpatient Specialists:     Brief Narrative:  Lisa Shaffer is a 70 y.o. female with stroke with left-sided hemiplegia, dementia, diabetes mellitus, hypertension, chronic anemia and recently diagnosed left lung abscess was treated with Augmentin prolonged course and also had bronchoscopy last month was brought to the ER because of poor oral intake over the last few weeks. Patient's husband states patient may have lost considerable weight over the last 2 months. As per the patient's husband patient has not been eating well for last few weeks and particularly last 2 weeks. Patient throws up whenever she eats, denies any diarrhea. Unable to take her medications for at least last 2 weeks. Patient was recently prescribed Megace by patient's primary care physician. Labs revealed acute renal failure with elevated lactate levels and chest x-ray shows improving left midlung zone cavitary lesion pneumonia. Patient is being admitted for acute renal failure probably from dehydration and persistent nausea vomiting.  Family to met with palliative care on 7/28- plan to take home with hospice if able to eat/get support.   Assessment & Plan:   Principal Problem:   Acute renal failure (HCC) Active Problems:   Essential hypertension   Nausea & vomiting   DM2 (diabetes mellitus, type 2) (HCC)   Hypokalemia   Lactic acidosis   ARF (acute renal failure) (HCC)   Pressure ulcer   Palliative care patient   Thrush   Thrush of mouth and esophagus ([place])   AMS  -appears resolved  -EEG- negative  -MRI- atrophy, old CVA-- no new findings  Acute renal failure with metabolic acidosis -  - Cr and lactate level was markedly elevated upon admission, much improved now.  -D51/2 NS decrease amount  Persistent nausea vomiting -  ? Thrush- start diflucan -during last admission thought to be diabetic  gastroparesis -LFTs not elevated  -CT scan shows: Multiple small hepatic hypodense lesions, incompletely characterized but appears stable compared the prior study. -restarted IV reglan- change to PO  -tolerated clears last PM  Elevated lactate levels - probably from persistent nausea and vomiting and poor oral intake and dehydration. Continue with hydration  -improved  History of lung abscesses with chest x-ray showing improvement  -d/c zosyn as cultures negative  Diabetes mellitus type 2 -  -sliding scale coverage. -BS on low end- add d51/2 NS  Hypertension -  -hold PO meds  History of dementia. -appears advances -palliative care consult  Chronic anemia - follow CBC. -transfuse 1 unit -? Volume dilution -no sign of active bleeding -heme test stools  History of stroke with left-sided hemiplegia on aspirin.   Hypokalemia  -replaced IV  -Mg normal  Pressure ulcer - stage II- partial thickness loss of dermis in gluteal region surrounding rectum- present on admission  DVT prophylaxis:    Code Status: Full Code  Family Communication: Son on phone  Disposition Plan:  Home Monday/Tuesday with hospice   Consultants:   Palliative care  Procedures:        Subjective: Tolerated clear diet yesterday    Objective: Vitals:   02/01/16 0120 02/01/16 1300 02/01/16 2119 02/02/16 0534  BP:  (!) 134/92 114/67 135/80  Pulse:  87 99 81  Resp:  '13 14 14  '$ Temp:  97.6 F (36.4 C) 97.6 F (36.4 C) 98.3 F (36.8 C)  TempSrc:  Oral Axillary Oral  SpO2:  100% 100% 100%  Weight: 42.3  kg (93 lb 3.2 oz)     Height:        Intake/Output Summary (Last 24 hours) at 02/02/16 0936 Last data filed at 02/02/16 0535  Gross per 24 hour  Intake             1120 ml  Output             1350 ml  Net             -230 ml   Filed Weights   01/29/16 0245 01/30/16 2150 02/01/16 0120  Weight: 39.5 kg (87 lb 1.3 oz) 40.6 kg (89 lb 9.6 oz) 42.3 kg (93 lb 3.2 oz)     Examination:  General exam: Appears calm and comfortable - able to answer questions today- 1 word still-- similar mentation to yesterday Respiratory system: Clear to auscultation. Respiratory effort normal. Cardiovascular system: S1 & S2 heard, RRR. No JVD, murmurs, rubs, gallops or clicks. No pedal edema. Gastrointestinal system: Abdomen is nondistended, soft and nontender. No organomegaly or masses felt. Normal bowel sounds heard.      Data Reviewed: I have personally reviewed following labs and imaging studies  CBC:  Recent Labs Lab 01/28/16 2005 01/30/16 0326 01/31/16 0442 02/01/16 0753  WBC 4.8 6.6 9.1 9.6  HGB 8.8* 7.0* 6.2* 8.5*  HCT 25.7* 20.5* 17.7* 24.3*  MCV 74.9* 73.5* 73.8* 75.9*  PLT 214 161 160 144   Basic Metabolic Panel:  Recent Labs Lab 01/28/16 2005 01/29/16 0535 01/30/16 0326 01/30/16 1620 01/31/16 0442 02/01/16 0753  NA 145 145 147* 147* 144 139  K 2.4* 3.0* 2.6* 3.6 3.4* 4.5  CL 107 114* 116* 118* 118* 111  CO2 18* 14* 16* 15* 18* 19*  GLUCOSE 163* 83 88 96 205* 89  BUN 28* 25* '17 16 16 10  '$ CREATININE 2.35* 2.08* 1.74* 1.70* 1.54* 1.41*  CALCIUM 9.2 8.0* 7.6* 7.6* 7.3* 7.7*  MG 2.2 2.1  --   --   --   --    GFR: Estimated Creatinine Clearance: 21.3 mL/min (by C-G formula based on SCr of 1.41 mg/dL). Liver Function Tests:  Recent Labs Lab 01/28/16 2005 01/29/16 0535 01/30/16 0326  AST 16 21 14*  ALT 7* 8* 7*  ALKPHOS 85 76 75  BILITOT 1.5* 1.8* 1.4*  PROT 7.0 5.9* 5.6*  ALBUMIN 3.5 3.0* 2.7*    Recent Labs Lab 01/28/16 2005  LIPASE 28   No results for input(s): AMMONIA in the last 168 hours. Coagulation Profile: No results for input(s): INR, PROTIME in the last 168 hours. Cardiac Enzymes:  Recent Labs Lab 01/29/16 0239 01/29/16 1611 01/29/16 2345 01/30/16 0326  TROPONINI 0.03* 0.03* 0.03* 0.03*   BNP (last 3 results) No results for input(s): PROBNP in the last 8760 hours. HbA1C: No results for input(s):  HGBA1C in the last 72 hours. CBG:  Recent Labs Lab 02/01/16 1937 02/01/16 2319 02/02/16 0320 02/02/16 0355 02/02/16 0736  GLUCAP 160* 136* 58* 130* 85   Lipid Profile: No results for input(s): CHOL, HDL, LDLCALC, TRIG, CHOLHDL, LDLDIRECT in the last 72 hours. Thyroid Function Tests: No results for input(s): TSH, T4TOTAL, FREET4, T3FREE, THYROIDAB in the last 72 hours. Anemia Panel: No results for input(s): VITAMINB12, FOLATE, FERRITIN, TIBC, IRON, RETICCTPCT in the last 72 hours. Urine analysis:    Component Value Date/Time   COLORURINE YELLOW 01/28/2016 1901   APPEARANCEUR CLOUDY (A) 01/28/2016 1901   LABSPEC 1.015 01/28/2016 1901   PHURINE 5.5 01/28/2016 1901   GLUCOSEU NEGATIVE  01/28/2016 1901   GLUCOSEU NEGATIVE 06/06/2007 1004   HGBUR NEGATIVE 01/28/2016 1901   BILIRUBINUR SMALL (A) 01/28/2016 1901   KETONESUR NEGATIVE 01/28/2016 1901   PROTEINUR NEGATIVE 01/28/2016 1901   UROBILINOGEN 0.2 mg/dL 06/06/2007 1004   NITRITE NEGATIVE 01/28/2016 1901   LEUKOCYTESUR NEGATIVE 01/28/2016 1901   ) Recent Results (from the past 240 hour(s))  MRSA PCR Screening     Status: None   Collection Time: 01/29/16  2:50 AM  Result Value Ref Range Status   MRSA by PCR NEGATIVE NEGATIVE Final    Comment:        The GeneXpert MRSA Assay (FDA approved for NASAL specimens only), is one component of a comprehensive MRSA colonization surveillance program. It is not intended to diagnose MRSA infection nor to guide or monitor treatment for MRSA infections.   Culture, blood (routine x 2)     Status: None (Preliminary result)   Collection Time: 01/29/16  8:28 AM  Result Value Ref Range Status   Specimen Description BLOOD RIGHT HAND  Final   Special Requests BOTTLES DRAWN AEROBIC ONLY ARPEDB 4CC  Final   Culture   Final    NO GROWTH 3 DAYS Performed at [facility]    Report Status PENDING  Incomplete  Culture, blood (routine x 2)     Status: None (Preliminary result)    Collection Time: 01/29/16  8:29 AM  Result Value Ref Range Status   Specimen Description BLOOD RIGHT ARM  Final   Special Requests BOTTLES DRAWN AEROBIC AND ANAEROBIC 5 CC EACH  Final   Culture   Final    NO GROWTH 3 DAYS Performed at [facility]    Report Status PENDING  Incomplete      Anti-infectives    Start     Dose/Rate Route Frequency Ordered Stop   02/01/16 1600  piperacillin-tazobactam (ZOSYN) IVPB 3.375 g     3.375 g 12.5 mL/hr over 240 Minutes Intravenous Every 8 hours 02/01/16 1031     01/30/16 1200  fluconazole (DIFLUCAN) IVPB 100 mg     100 mg 50 mL/hr over 60 Minutes Intravenous Every 24 hours 01/30/16 0952     01/29/16 1400  piperacillin-tazobactam (ZOSYN) IVPB 2.25 g  Status:  Discontinued     2.25 g 100 mL/hr over 30 Minutes Intravenous Every 6 hours 01/29/16 0705 02/01/16 1030   01/29/16 0715  piperacillin-tazobactam (ZOSYN) IVPB 2.25 g     2.25 g 100 mL/hr over 30 Minutes Intravenous STAT 01/29/16 0704 01/29/16 0757       Radiology Studies: No results found.      Scheduled Meds: . antiseptic oral rinse  7 mL Mouth Rinse BID  . aspirin  300 mg Rectal Daily  . feeding supplement  1 Container Oral TID BM  . fluconazole (DIFLUCAN) IV  100 mg Intravenous Q24H  . heparin  5,000 Units Subcutaneous Q8H  . metoCLOPramide (REGLAN) injection  5 mg Intravenous Q6H  . piperacillin-tazobactam (ZOSYN)  IV  3.375 g Intravenous Q8H  . potassium chloride  40 mEq Oral Once  . thiamine IV  100 mg Intravenous Daily   Continuous Infusions: . dextrose 5 % and 0.45% NaCl 50 mL/hr at 02/02/16 0548     LOS: 4 days    Time spent: 25 min    [place], DO [facility] Pager [phone]  If 7PM-7AM, please contact night-coverage [email or web address] Password [facility] 02/02/2016, 9:36 AM

## 2016-02-02 NOTE — Progress Notes (Signed)
Pt tolerated sips of clear liquids at lunch and dinner this shift with no vomiting noted. Swallowing precautions as recommended by speech therapy as well as posted in pt's room observed. Family attentive and assisted during meal times.

## 2016-02-02 NOTE — Progress Notes (Signed)
Hypoglycemic Event  CBG: 58  Treatment: D50 IV 25 mL  Symptoms: None  Follow-up CBG: Time:0355 CBG Result:130  Possible Reasons for Event: Inadequate meal intake  Comments/MD notified:    Lisa Shaffer

## 2016-02-03 ENCOUNTER — Telehealth: Payer: Self-pay | Admitting: Adult Health

## 2016-02-03 DIAGNOSIS — E1169 Type 2 diabetes mellitus with other specified complication: Secondary | ICD-10-CM

## 2016-02-03 LAB — CBC
HCT: 24.2 % — ABNORMAL LOW (ref 36.0–46.0)
HEMOGLOBIN: 8.8 g/dL — AB (ref 12.0–15.0)
MCH: 27.4 pg (ref 26.0–34.0)
MCHC: 36.4 g/dL — AB (ref 30.0–36.0)
MCV: 75.4 fL — ABNORMAL LOW (ref 78.0–100.0)
Platelets: 145 10*3/uL — ABNORMAL LOW (ref 150–400)
RBC: 3.21 MIL/uL — ABNORMAL LOW (ref 3.87–5.11)
RDW: 17.1 % — ABNORMAL HIGH (ref 11.5–15.5)
WBC: 7.9 10*3/uL (ref 4.0–10.5)

## 2016-02-03 LAB — BASIC METABOLIC PANEL
Anion gap: 8 (ref 5–15)
Anion gap: 9 (ref 5–15)
BUN: 8 mg/dL (ref 6–20)
BUN: 9 mg/dL (ref 6–20)
CALCIUM: 8.1 mg/dL — AB (ref 8.9–10.3)
CHLORIDE: 112 mmol/L — AB (ref 101–111)
CHLORIDE: 110 mmol/L (ref 101–111)
CO2: 20 mmol/L — ABNORMAL LOW (ref 22–32)
CO2: 19 mmol/L — AB (ref 22–32)
CREATININE: 1.33 mg/dL — AB (ref 0.44–1.00)
CREATININE: 1.67 mg/dL — AB (ref 0.44–1.00)
Calcium: 8.4 mg/dL — ABNORMAL LOW (ref 8.9–10.3)
GFR calc Af Amer: 35 mL/min — ABNORMAL LOW (ref >60)
GFR calc non Af Amer: 30 mL/min — ABNORMAL LOW (ref >60)
GFR, EST AFRICAN AMERICAN: 46 mL/min — AB (ref >60)
GFR, EST NON AFRICAN AMERICAN: 39 mL/min — AB (ref >60)
GLUCOSE: 200 mg/dL — AB (ref 65–99)
Glucose, Bld: 91 mg/dL (ref 65–99)
Potassium: 2.2 mmol/L — CL (ref 3.5–5.1)
Potassium: 3 mmol/L — ABNORMAL LOW (ref 3.5–5.1)
SODIUM: 140 mmol/L (ref 135–145)
Sodium: 138 mmol/L (ref 135–145)

## 2016-02-03 LAB — CULTURE, BLOOD (ROUTINE X 2)
Culture: NO GROWTH
Culture: NO GROWTH

## 2016-02-03 LAB — GLUCOSE, CAPILLARY
GLUCOSE-CAPILLARY: 121 mg/dL — AB (ref 65–99)
GLUCOSE-CAPILLARY: 93 mg/dL (ref 65–99)
Glucose-Capillary: 92 mg/dL (ref 65–99)
Glucose-Capillary: 138 mg/dL — ABNORMAL HIGH (ref 65–99)
Glucose-Capillary: 165 mg/dL — ABNORMAL HIGH (ref 65–99)
Glucose-Capillary: 162 mg/dL — ABNORMAL HIGH (ref 65–99)

## 2016-02-03 LAB — MAGNESIUM: MAGNESIUM: 1 mg/dL — AB (ref 1.7–2.4)

## 2016-02-03 MED ORDER — METOCLOPRAMIDE HCL 5 MG/5ML PO SOLN
10.0000 mg | Freq: Three times a day (TID) | ORAL | Status: DC
  Administered 2016-02-03 (×3): 10 mg via ORAL
  Filled 2016-02-03 (×6): qty 10

## 2016-02-03 MED ORDER — POTASSIUM CHLORIDE 10 MEQ/100ML IV SOLN
10.0000 meq | INTRAVENOUS | Status: AC
  Administered 2016-02-03 (×4): 10 meq via INTRAVENOUS
  Filled 2016-02-03 (×4): qty 100

## 2016-02-03 NOTE — Progress Notes (Addendum)
PROGRESS NOTE    Lisa Shaffer  HUT:[id number] DOB: [birth year] DOA: 01/28/2016 PCP: Drema Pry, DO   Outpatient Specialists:     Brief Narrative:  Lisa Shaffer is a 70 y.o. female with stroke with left-sided hemiplegia, dementia, diabetes mellitus, hypertension, chronic anemia and recently diagnosed left lung abscess was treated with Augmentin prolonged course and also had bronchoscopy last month was brought to the ER because of poor oral intake over the last few weeks. Patient's husband states patient may have lost considerable weight over the last 2 months. As per the patient's husband patient has not been eating well for last few weeks and particularly last 2 weeks. Patient throws up whenever she eats, denies any diarrhea. Unable to take her medications for at least last 2 weeks. Patient was recently prescribed Megace by patient's primary care physician. Labs revealed acute renal failure with elevated lactate levels and chest x-ray shows improving left midlung zone cavitary lesion pneumonia. Patient is being admitted for acute renal failure probably from dehydration and persistent nausea vomiting.  Family to met with palliative care on 7/28- plan to take home with hospice when stable   Assessment & Plan:   Principal Problem:   Acute renal failure ([place]) Active Problems:   Essential hypertension   Nausea & vomiting   DM2 (diabetes mellitus, type 2) (HCC)   Hypokalemia   Lactic acidosis   ARF (acute renal failure) (HCC)   Pressure ulcer   Palliative care patient   Thrush   Thrush of mouth and esophagus ([place])   AMS  -appears resolved  -EEG- negative  -MRI- atrophy, old CVA-- no new findings  Acute renal failure with metabolic acidosis -  - Cr and lactate level was markedly elevated upon admission, much improved now.   Persistent nausea vomiting -  ? 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 -restarted IV reglan- change to PO  -tolerated clears last PM now, 2 days in a row ?re-feeding syndrome- replace K and Mg  Elevated lactate levels - probably from persistent nausea and vomiting and poor oral intake and dehydration. Continue with hydration  -improved  History of lung abscesses with chest x-ray showing improvement  -d/c zosyn as cultures negative  Diabetes mellitus type 2 -  -no treatment currently as blood sugars low  Hypertension -  -hold PO meds  History of dementia. -appears advances -palliative care consult  Chronic anemia - follow CBC. -transfuse 1 unit -? Volume dilution -no sign of active bleeding -heme test stools  History of stroke with left-sided hemiplegia on aspirin.   Hypokalemia  -replaced IV  -Mg normal  Severe malnutrition in context of acute illness/injury   Pressure ulcer - stage II- partial thickness loss of dermis in gluteal region surrounding rectum- present on admission  DVT prophylaxis:    Code Status: Full Code  Family Communication: Called husband  Disposition Plan:  Home Tuesday/Wednesday with hospice   Consultants:   Palliative care  Procedures:        Subjective: Tolerated clear diet again last night No overnight events- low K today    Objective: Vitals:   02/02/16 0534 02/02/16 1558 02/02/16 2105 02/03/16 0625  BP: 135/80 105/78 117/69 109/75  Pulse: 81 88 81 91  Resp: '14 14 14 14  '$ Temp: 98.3 F (36.8 C) 97.7 F (36.5 C) 98.4 F (36.9  C) 98.3 F (36.8 C)  TempSrc: Oral Axillary Oral Oral  SpO2: 100% 100% 100% 100%  Weight:      Height:        Intake/Output Summary (Last 24 hours) at 02/03/16 0845 Last data filed at 02/03/16 0600  Gross per 24 hour  Intake           947.08 ml  Output              250 ml  Net           697.08 ml   Filed Weights   01/29/16 0245 01/30/16  2150 02/01/16 0120  Weight: 39.5 kg (87 lb 1.3 oz) 40.6 kg (89 lb 9.6 oz) 42.3 kg (93 lb 3.2 oz)    Examination:  General exam: Appears calm and comfortable - interactive Respiratory system: Clear to auscultation. Respiratory effort normal. Cardiovascular system: S1 & S2 heard, RRR. No JVD, murmurs, rubs, gallops or clicks. No pedal edema. Gastrointestinal system: Abdomen is nondistended, soft and nontender. No organomegaly or masses felt. Normal bowel sounds heard.      Data Reviewed: I have personally reviewed following labs and imaging studies  CBC:  Recent Labs Lab 01/28/16 2005 01/30/16 0326 01/31/16 0442 02/01/16 0753 02/03/16 0437  WBC 4.8 6.6 9.1 9.6 7.9  HGB 8.8* 7.0* 6.2* 8.5* 8.8*  HCT 25.7* 20.5* 17.7* 24.3* 24.2*  MCV 74.9* 73.5* 73.8* 75.9* 75.4*  PLT 214 161 160 156 440*   Basic Metabolic Panel:  Recent Labs Lab 01/28/16 2005 01/29/16 0535 01/30/16 0326 01/30/16 1620 01/31/16 0442 02/01/16 0753 02/03/16 0437  NA 145 145 147* 147* 144 139 140  K 2.4* 3.0* 2.6* 3.6 3.4* 4.5 2.2*  CL 107 114* 116* 118* 118* 111 112*  CO2 18* 14* 16* 15* 18* 19* 20*  GLUCOSE 163* 83 88 96 205* 89 91  BUN 28* 25* '17 16 16 10 8  '$ CREATININE 2.35* 2.08* 1.74* 1.70* 1.54* 1.41* 1.33*  CALCIUM 9.2 8.0* 7.6* 7.6* 7.3* 7.7* 8.1*  MG 2.2 2.1  --   --   --   --   --    GFR: Estimated Creatinine Clearance: 22.6 mL/min (by C-G formula based on SCr of 1.33 mg/dL). Liver Function Tests:  Recent Labs Lab 01/28/16 2005 01/29/16 0535 01/30/16 0326  AST 16 21 14*  ALT 7* 8* 7*  ALKPHOS 85 76 75  BILITOT 1.5* 1.8* 1.4*  PROT 7.0 5.9* 5.6*  ALBUMIN 3.5 3.0* 2.7*    Recent Labs Lab 01/28/16 2005  LIPASE 28   No results for input(s): AMMONIA in the last 168 hours. Coagulation Profile: No results for input(s): INR, PROTIME in the last 168 hours. Cardiac Enzymes:  Recent Labs Lab 01/29/16 0239 01/29/16 1611 01/29/16 2345 01/30/16 0326  TROPONINI 0.03* 0.03*  0.03* 0.03*   BNP (last 3 results) No results for input(s): PROBNP in the last 8760 hours. HbA1C: No results for input(s): HGBA1C in the last 72 hours. CBG:  Recent Labs Lab 02/02/16 1554 02/02/16 1946 02/02/16 2350 02/03/16 0339 02/03/16 0803  GLUCAP 194* 108* 100* 93 92   Lipid Profile: No results for input(s): CHOL, HDL, LDLCALC, TRIG, CHOLHDL, LDLDIRECT in the last 72 hours. Thyroid Function Tests: No results for input(s): TSH, T4TOTAL, FREET4, T3FREE, THYROIDAB in the last 72 hours. Anemia Panel: No results for input(s): VITAMINB12, FOLATE, FERRITIN, TIBC, IRON, RETICCTPCT in the last 72 hours. Urine analysis:    Component Value Date/Time   COLORURINE YELLOW 01/28/2016 1901  APPEARANCEUR CLOUDY (A) 01/28/2016 1901   LABSPEC 1.015 01/28/2016 1901   PHURINE 5.5 01/28/2016 1901   GLUCOSEU NEGATIVE 01/28/2016 1901   GLUCOSEU NEGATIVE 06/06/2007 1004   HGBUR NEGATIVE 01/28/2016 1901   BILIRUBINUR SMALL (A) 01/28/2016 1901   KETONESUR NEGATIVE 01/28/2016 1901   PROTEINUR NEGATIVE 01/28/2016 1901   UROBILINOGEN 0.2 mg/dL 06/06/2007 1004   NITRITE NEGATIVE 01/28/2016 1901   LEUKOCYTESUR NEGATIVE 01/28/2016 1901   ) Recent Results (from the past 240 hour(s))  MRSA PCR Screening     Status: None   Collection Time: 01/29/16  2:50 AM  Result Value Ref Range Status   MRSA by PCR NEGATIVE NEGATIVE Final    Comment:        The GeneXpert MRSA Assay (FDA approved for NASAL specimens only), is one component of a comprehensive MRSA colonization surveillance program. It is not intended to diagnose MRSA infection nor to guide or monitor treatment for MRSA infections.   Culture, blood (routine x 2)     Status: None (Preliminary result)   Collection Time: 01/29/16  8:28 AM  Result Value Ref Range Status   Specimen Description BLOOD RIGHT HAND  Final   Special Requests BOTTLES DRAWN AEROBIC ONLY ARPEDB 4CC  Final   Culture   Final    NO GROWTH 4 DAYS Performed at [facility]    Report Status PENDING  Incomplete  Culture, blood (routine x 2)     Status: None (Preliminary result)   Collection Time: 01/29/16  8:29 AM  Result Value Ref Range Status   Specimen Description BLOOD RIGHT ARM  Final   Special Requests BOTTLES DRAWN AEROBIC AND ANAEROBIC 5 CC EACH  Final   Culture   Final    NO GROWTH 4 DAYS Performed at [facility]    Report Status PENDING  Incomplete      Anti-infectives    Start     Dose/Rate Route Frequency Ordered Stop   02/02/16 1000  fluconazole (DIFLUCAN) tablet 100 mg     100 mg Oral Daily 02/02/16 0937     02/01/16 1600  piperacillin-tazobactam (ZOSYN) IVPB 3.375 g  Status:  Discontinued     3.375 g 12.5 mL/hr over 240 Minutes Intravenous Every 8 hours 02/01/16 1031 02/02/16 1011   01/30/16 1200  fluconazole (DIFLUCAN) IVPB 100 mg  Status:  Discontinued     100 mg 50 mL/hr over 60 Minutes Intravenous Every 24 hours 01/30/16 0952 02/02/16 0937   01/29/16 1400  piperacillin-tazobactam (ZOSYN) IVPB 2.25 g  Status:  Discontinued     2.25 g 100 mL/hr over 30 Minutes Intravenous Every 6 hours 01/29/16 0705 02/01/16 1030   01/29/16 0715  piperacillin-tazobactam (ZOSYN) IVPB 2.25 g     2.25 g 100 mL/hr over 30 Minutes Intravenous STAT 01/29/16 0704 01/29/16 0757       Radiology Studies: No results found.      Scheduled Meds: . antiseptic oral rinse  7 mL Mouth Rinse BID  . aspirin  300 mg Rectal Daily  . feeding supplement  1 Container Oral TID BM  . fluconazole  100 mg Oral Daily  . heparin  5,000 Units Subcutaneous Q8H  . metoCLOPramide  10 mg Oral TID AC & HS  . potassium chloride  10 mEq Intravenous Q1 Hr x 4  . potassium chloride  40 mEq Oral Once  . thiamine IV  100 mg Intravenous Daily   Continuous Infusions: . dextrose 5 % and 0.45% NaCl 25 mL/hr  at 02/02/16 1017     LOS: 5 days    Time spent: 25 min    [place], DO [facility] Pager [phone]  If 7PM-7AM, please  contact night-coverage [email or web address] Password [email or web address] 02/03/2016, 8:45 AM

## 2016-02-03 NOTE — Progress Notes (Signed)
CRITICAL VALUE ALERT  Critical value received:  K+ 2.2  Date of notification:  02/03/16  Time of notification:  0530  Critical value read back:Yes.    Nurse who received alert:  Einar Crow  MD notified (1st page):  [place]  Time of first page:  0540  MD notified (2nd page):  Time of second page:  Responding MD:  [place]  Time MD responded:  [phone]

## 2016-02-03 NOTE — Progress Notes (Addendum)
Speech Language Pathology  Patient Details Name: Lisa Shaffer MRN: [id number] DOB: [birth year] Today's Date: 02/03/2016 Time:  -       Pt seen for initial swallow assessment 7/29 with family present and educated. Pt going home with hospice Tues or Wed per MD note. No family currently present, therefore this SLP will defer treatment today and evaluating/primary SLP can f/u next date for further education with family.       Breck Coons Welch.Ed [employer] [phone]

## 2016-02-03 NOTE — Progress Notes (Signed)
Notified by Lorenda Ishihara, CMRN of family request for [facility] at home after discharge. Chart and patient information currently under review to confirm hospice eligibility.  Spoke with patient's spouse over telephone  to initiate education related to hospice philosophy, services and team approach to care. Family verbalized understanding of the information provided. Per discussion plan is for discharge to home by personal vehicle with family when stable for discharge.    Please send signed completed DNR form home with patient.   Patient will need prescriptions for discharge comfort medications.  DME needs discussed and family requested hospital bed and OBT.   [facility] equipment manager Jewel Kizzie Bane notified and will contact [facility] to arrange delivery to the home.  The home address has been verified and is correct in the chart; Mr. Hawkey is the family member to be contacted to arrange time of delivery.  [facility] aware of the above.  Completed discharge summary will need to be faxed to [facility] at [phone] when final.  Please notify [facility] when patient is ready to leave unit at discharge-call [phone].  [facility] information and contact numbers have been given to Mr. Rittenberry via telephone  Above information shared with Lorenda Ishihara, [facility].  Please call with any questions.   Thank You!   Lavone Neri, RN [facility] Liaison  [phone]

## 2016-02-03 NOTE — Progress Notes (Signed)
Physical Therapy Discharge Patient Details Name: Lisa Shaffer MRN: [id number] DOB: 1946-06-05 Today's Date: 02/03/2016 Time:  -     Patient discharged from PT services secondary to medical decline - will need to re-order PT to resume therapy services.note  Hospice to follow at DC. Please see latest therapy progress note for current level of functioning and progress toward goals.    GP     Sharen Heck PT [phone]  02/03/2016, 6:23 PM

## 2016-02-03 NOTE — Telephone Encounter (Signed)
[facility] would like to know if Lisa Shaffer will be the attending physician. Pt being discharged tomorrow.

## 2016-02-03 NOTE — Progress Notes (Signed)
Contacted son at home, he states they have chosen [facility] for home hospice services. Contacted [facility] for referral. [phone]

## 2016-02-03 NOTE — Care Management Important Message (Signed)
Important Message  Patient Details  Name: Lisa Shaffer MRN: [id number] Date of Birth: 1946-03-25   Medicare Important Message Given:  Yes    [name] 02/03/2016, 11:07 AMImportant Message  Patient Details  Name: Lisa Shaffer MRN: [id number] Date of Birth: 1945-08-21   Medicare Important Message Given:  Yes    [name] 02/03/2016, 11:07 AM

## 2016-02-04 DIAGNOSIS — Z515 Encounter for palliative care: Secondary | ICD-10-CM

## 2016-02-04 DIAGNOSIS — B3781 Candidal esophagitis: Secondary | ICD-10-CM

## 2016-02-04 LAB — CBC
HCT: 18.4 % — ABNORMAL LOW (ref 36.0–46.0)
Hemoglobin: 6.6 g/dL — CL (ref 12.0–15.0)
MCH: 27.2 pg (ref 26.0–34.0)
MCHC: 35.9 g/dL (ref 30.0–36.0)
MCV: 75.7 fL — AB (ref 78.0–100.0)
PLATELETS: 142 10*3/uL — AB (ref 150–400)
RBC: 2.43 MIL/uL — ABNORMAL LOW (ref 3.87–5.11)
RDW: 17.6 % — AB (ref 11.5–15.5)
WBC: 11.6 10*3/uL — ABNORMAL HIGH (ref 4.0–10.5)

## 2016-02-04 LAB — GLUCOSE, CAPILLARY
GLUCOSE-CAPILLARY: 135 mg/dL — AB (ref 65–99)
Glucose-Capillary: 135 mg/dL — ABNORMAL HIGH (ref 65–99)

## 2016-02-04 LAB — TYPE AND SCREEN
ABO/RH(D): O POS
Antibody Screen: NEGATIVE
Unit division: 0
Unit division: 0

## 2016-02-04 LAB — BASIC METABOLIC PANEL
Anion gap: 12 (ref 5–15)
BUN: 9 mg/dL (ref 6–20)
CALCIUM: 7.9 mg/dL — AB (ref 8.9–10.3)
CO2: 16 mmol/L — AB (ref 22–32)
Chloride: 112 mmol/L — ABNORMAL HIGH (ref 101–111)
Creatinine, Ser: 1.69 mg/dL — ABNORMAL HIGH (ref 0.44–1.00)
GFR calc Af Amer: 34 mL/min — ABNORMAL LOW (ref >60)
GFR, EST NON AFRICAN AMERICAN: 30 mL/min — AB (ref >60)
GLUCOSE: 143 mg/dL — AB (ref 65–99)
Potassium: 2.4 mmol/L — CL (ref 3.5–5.1)
Sodium: 140 mmol/L (ref 135–145)

## 2016-02-04 LAB — APTT
aPTT: 113 seconds — ABNORMAL HIGH (ref 24–36)
aPTT: 89 seconds — ABNORMAL HIGH (ref 24–36)

## 2016-02-04 LAB — PROTIME-INR
INR: 1.11
Prothrombin Time: 14.4 seconds (ref 11.4–15.2)

## 2016-02-04 LAB — PREPARE RBC (CROSSMATCH)

## 2016-02-04 MED ORDER — SODIUM CHLORIDE 0.9 % IV SOLN: INTRAVENOUS | Status: DC

## 2016-02-04 MED ORDER — HYDROMORPHONE HCL 1 MG/ML IJ SOLN: 0.2500 mg | INTRAMUSCULAR | Status: DC | PRN

## 2016-02-04 MED ORDER — ACETAMINOPHEN 325 MG PO TABS: 650.0000 mg | ORAL_TABLET | Freq: Four times a day (QID) | ORAL | Status: DC | PRN

## 2016-02-04 MED ORDER — POTASSIUM CHLORIDE 20 MEQ/15ML (10%) PO SOLN
40.0000 meq | Freq: Once | ORAL | Status: DC
  Filled 2016-02-04: qty 30

## 2016-02-04 MED ORDER — ACETAMINOPHEN 650 MG RE SUPP: 650.0000 mg | Freq: Four times a day (QID) | RECTAL | Status: DC | PRN

## 2016-02-04 MED ORDER — POTASSIUM CHLORIDE 10 MEQ/100ML IV SOLN
10.0000 meq | INTRAVENOUS | Status: DC
  Administered 2016-02-04 (×3): 10 meq via INTRAVENOUS
  Filled 2016-02-04 (×5): qty 100

## 2016-02-04 MED ORDER — SODIUM CHLORIDE 0.9 % IV SOLN: Freq: Once | INTRAVENOUS | Status: DC

## 2016-02-04 MED ORDER — SODIUM CHLORIDE 0.9 % IV SOLN: Freq: Once | INTRAVENOUS | Status: AC

## 2016-02-04 MED ORDER — HALOPERIDOL 0.5 MG PO TABS
0.5000 mg | ORAL_TABLET | ORAL | Status: DC | PRN
  Filled 2016-02-04: qty 1

## 2016-02-04 MED ORDER — HALOPERIDOL LACTATE 2 MG/ML PO CONC
0.5000 mg | ORAL | Status: DC | PRN
Start: 2016-02-04 — End: 2016-02-05
  Filled 2016-02-04: qty 0.3

## 2016-02-04 MED ORDER — POTASSIUM CHLORIDE 10 MEQ/100ML IV SOLN: 10.0000 meq | INTRAVENOUS | Status: DC

## 2016-02-04 MED ORDER — ONDANSETRON 4 MG PO TBDP: 4.0000 mg | ORAL_TABLET | Freq: Four times a day (QID) | ORAL | Status: DC | PRN

## 2016-02-04 MED ORDER — MAGIC MOUTHWASH
5.0000 mL | Freq: Four times a day (QID) | ORAL | Status: DC
  Administered 2016-02-04 (×3): 5 mL via ORAL
  Filled 2016-02-04 (×7): qty 5

## 2016-02-04 MED ORDER — GLYCOPYRROLATE 1 MG PO TABS
1.0000 mg | ORAL_TABLET | ORAL | Status: DC | PRN
  Filled 2016-02-04: qty 1

## 2016-02-04 MED ORDER — POTASSIUM CHLORIDE 10 MEQ/100ML IV SOLN
10.0000 meq | INTRAVENOUS | Status: DC
  Filled 2016-02-04 (×6): qty 100

## 2016-02-04 MED ORDER — GLYCOPYRROLATE 0.2 MG/ML IJ SOLN
0.2000 mg | INTRAMUSCULAR | Status: DC | PRN
  Filled 2016-02-04: qty 1

## 2016-02-04 MED ORDER — ONDANSETRON HCL 4 MG/2ML IJ SOLN: 4.0000 mg | Freq: Four times a day (QID) | INTRAMUSCULAR | Status: DC | PRN

## 2016-02-04 MED ORDER — "THROMBI-PAD 3""X3"" EX PADS"
1.0000 | MEDICATED_PAD | Freq: Once | CUTANEOUS | Status: AC
  Administered 2016-02-04: 1 via TOPICAL
  Filled 2016-02-04: qty 1

## 2016-02-04 MED ORDER — BIOTENE DRY MOUTH MT LIQD: 15.0000 mL | OROMUCOSAL | Status: DC | PRN

## 2016-02-04 MED ORDER — MAGNESIUM SULFATE 2 GM/50ML IV SOLN
2.0000 g | Freq: Once | INTRAVENOUS | Status: DC
  Filled 2016-02-04: qty 50

## 2016-02-04 MED ORDER — MORPHINE SULFATE (PF) 2 MG/ML IV SOLN
2.0000 mg | Freq: Once | INTRAVENOUS | Status: AC
Start: 2016-02-04 — End: 2016-02-04
  Administered 2016-02-04: 2 mg via INTRAVENOUS
  Filled 2016-02-04: qty 1

## 2016-02-04 MED ORDER — HALOPERIDOL LACTATE 5 MG/ML IJ SOLN: 0.5000 mg | INTRAMUSCULAR | Status: DC | PRN

## 2016-02-04 MED ORDER — SODIUM CHLORIDE 0.9 % IV BOLUS (SEPSIS)
500.0000 mL | Freq: Once | INTRAVENOUS | Status: AC
  Administered 2016-02-04: 500 mL via INTRAVENOUS

## 2016-02-04 MED ORDER — POLYVINYL ALCOHOL 1.4 % OP SOLN
1.0000 [drp] | Freq: Four times a day (QID) | OPHTHALMIC | Status: DC | PRN
  Filled 2016-02-04: qty 15

## 2016-02-04 NOTE — Telephone Encounter (Signed)
That is fine 

## 2016-02-04 NOTE — Progress Notes (Signed)
Nutrition Brief Note  Chart reviewed. Pt now transitioning to comfort care.  No further nutrition interventions warranted at this time.  Please re-consult as needed.   Syncere Kaminski M. Nichole Keltner, MS, RD LDN Inpatient Clinical Dietitian Pager [phone]    

## 2016-02-04 NOTE — Progress Notes (Signed)
PROGRESS NOTE    Lisa Shaffer  XYV:[id number] DOB: [birth year] DOA: 01/28/2016 PCP: Lisa Frees, NP   Outpatient Specialists:     Brief Narrative:  Lisa Shaffer is a 70 y.o. female with stroke with left-sided hemiplegia, dementia, diabetes mellitus, hypertension, chronic anemia and recently diagnosed left lung abscess was treated with Augmentin prolonged course and also had bronchoscopy last month was brought to the ER because of poor oral intake over the last few weeks. Patient's husband states patient may have lost considerable weight over the last 2 months. As per the patient's husband patient has not been eating well for last few weeks and particularly last 2 weeks. Patient throws up whenever she eats, denies any diarrhea. Unable to take her medications for at least last 2 weeks. Patient was recently prescribed Megace by patient's primary care physician. Labs revealed acute renal failure with elevated lactate levels and chest x-ray shows improving left midlung zone cavitary lesion pneumonia. During hospitalization, she improved slightly, able to eat. Appeared to develop re-feeding syndrome.  She worsened acutely on 7/31 PM with large hematoma.  Family elected to go with comfort care route after meeting with palliative care.   Assessment & Plan:   Principal Problem:   Acute renal failure (HCC) Active Problems:   Essential hypertension   Nausea & vomiting   DM2 (diabetes mellitus, type 2) (HCC)   Hypokalemia   Lactic acidosis   ARF (acute renal failure) (HCC)   Pressure ulcer   Palliative care patient   Lisa Shaffer of mouth and esophagus (HCC)  Large abdominal hematom after SQ heparin- -elevated PTT -normal PT/INR -2 gram drop in Hgb  Now with comfort care focus, will stop lab draws and electrolyte replacement Appreciate Dr. Phillips Shaffer and palliative care   DVT prophylaxis:    Code Status: Full Code  Family Communication: Son at bedside  Disposition Plan:      Consultants:   Palliative care  Procedures:        Subjective: Overnight developed large hematoma in abd wall Not as responsive today    Objective: Vitals:   02/03/16 1357 02/03/16 2134 02/04/16 0311 02/04/16 0500  BP: 115/68 110/70 (!) 76/49 120/73  Pulse: 88 95 (!) 119 (!) 108  Resp: 15 16 (!) 22 18  Temp: 98.6 F (37 C) 99.1 F (37.3 C) 97.8 F (36.6 C) 98.2 F (36.8 C)  TempSrc: Oral Oral Oral Oral  SpO2: 100%   100%  Weight:    99.2 kg (218 lb 11.1 oz)  Height:        Intake/Output Summary (Last 24 hours) at 02/04/16 1226 Last data filed at 02/04/16 1015  Gross per 24 hour  Intake               70 ml  Output                0 ml  Net               70 ml   Filed Weights   01/30/16 2150 02/01/16 0120 02/04/16 0500  Weight: 40.6 kg (89 lb 9.6 oz) 42.3 kg (93 lb 3.2 oz) 99.2 kg (218 lb 11.1 oz)    Examination:  General exam: less responsive- will open eyes only Respiratory system: Clear to auscultation. Respiratory effort normal. Cardiovascular system: rrr Gastrointestinal system: large oozing hematoma on left abd     Data Reviewed: I have personally reviewed following labs and imaging studies  CBC:  Recent  Labs Lab 01/30/16 0326 01/31/16 0442 02/01/16 0753 02/03/16 0437 02/04/16 0456  WBC 6.6 9.1 9.6 7.9 11.6*  HGB 7.0* 6.2* 8.5* 8.8* 6.6*  HCT 20.5* 17.7* 24.3* 24.2* 18.4*  MCV 73.5* 73.8* 75.9* 75.4* 75.7*  PLT 161 160 156 145* 142*   Basic Metabolic Panel:  Recent Labs Lab 01/28/16 2005 01/29/16 0535  01/31/16 0442 02/01/16 0753 02/03/16 0437 02/03/16 1622 02/04/16 0456  NA 145 145  < > 144 139 140 138 140  K 2.4* 3.0*  < > 3.4* 4.5 2.2* 3.0* 2.4*  CL 107 114*  < > 118* 111 112* 110 112*  CO2 18* 14*  < > 18* 19* 20* 19* 16*  GLUCOSE 163* 83  < > 205* 89 91 200* 143*  BUN 28* 25*  < > CREATININE 2.35* 2.08*  < > 1.54* 1.41* 1.33* 1.67* 1.69*  CALCIUM 9.2 8.0*  < > 7.3* 7.7* 8.1* 8.4* 7.9*  MG 2.2 2.1   --   --   --  1.0*  --   --   < > = values in this interval not displayed. GFR: Estimated Creatinine Clearance: 30.1 mL/min (by C-G formula based on SCr of 1.69 mg/dL). Liver Function Tests:  Recent Labs Lab 01/28/16 2005 01/29/16 0535 01/30/16 0326  AST 16 21 14*  ALT 7* 8* 7*  ALKPHOS 85 76 75  BILITOT 1.5* 1.8* 1.4*  PROT 7.0 5.9* 5.6*  ALBUMIN 3.5 3.0* 2.7*    Recent Labs Lab 01/28/16 2005  LIPASE 28   No results for input(s): AMMONIA in the last 168 hours. Coagulation Profile:  Recent Labs Lab 02/04/16 0638  INR 1.11   Cardiac Enzymes:  Recent Labs Lab 01/29/16 0239 01/29/16 1611 01/29/16 2345 01/30/16 0326  TROPONINI 0.03* 0.03* 0.03* 0.03*   BNP (last 3 results) No results for input(s): PROBNP in the last 8760 hours. HbA1C: No results for input(s): HGBA1C in the last 72 hours. CBG:  Recent Labs Lab 02/03/16 1843 02/03/16 1956 02/03/16 2349 02/04/16 0346 02/04/16 0747  GLUCAP 165* 162* 121* 135* 135*   Lipid Profile: No results for input(s): CHOL, HDL, LDLCALC, TRIG, CHOLHDL, LDLDIRECT in the last 72 hours. Thyroid Function Tests: No results for input(s): TSH, T4TOTAL, FREET4, T3FREE, THYROIDAB in the last 72 hours. Anemia Panel: No results for input(s): VITAMINB12, FOLATE, FERRITIN, TIBC, IRON, RETICCTPCT in the last 72 hours. Urine analysis:    Component Value Date/Time   COLORURINE YELLOW 01/28/2016 1901   APPEARANCEUR CLOUDY (A) 01/28/2016 1901   LABSPEC 1.015 01/28/2016 1901   PHURINE 5.5 01/28/2016 1901   GLUCOSEU NEGATIVE 01/28/2016 1901   GLUCOSEU NEGATIVE 06/06/2007 1004   HGBUR NEGATIVE 01/28/2016 1901   BILIRUBINUR SMALL (A) 01/28/2016 1901   KETONESUR NEGATIVE 01/28/2016 1901   PROTEINUR NEGATIVE 01/28/2016 1901   UROBILINOGEN 0.2 mg/dL 69/62/9528 4132   NITRITE NEGATIVE 01/28/2016 1901   LEUKOCYTESUR NEGATIVE 01/28/2016 1901   ) Recent Results (from the past 240 hour(s))  MRSA PCR Screening     Status: None    Collection Time: 01/29/16  2:50 AM  Result Value Ref Range Status   MRSA by PCR NEGATIVE NEGATIVE Final    Comment:        The GeneXpert MRSA Assay (FDA approved for NASAL specimens only), is one component of a comprehensive MRSA colonization surveillance program. It is not intended to diagnose MRSA infection nor to guide or monitor treatment for MRSA infections.   Culture, blood (routine x  2)     Status: None   Collection Time: 01/29/16  8:28 AM  Result Value Ref Range Status   Specimen Description BLOOD RIGHT HAND  Final   Special Requests BOTTLES DRAWN AEROBIC ONLY ARPEDB 4CC  Final   Culture   Final    NO GROWTH 5 DAYS Performed at [facility]    Report Status 02/03/2016 FINAL  Final  Culture, blood (routine x 2)     Status: None   Collection Time: 01/29/16  8:29 AM  Result Value Ref Range Status   Specimen Description BLOOD RIGHT ARM  Final   Special Requests BOTTLES DRAWN AEROBIC AND ANAEROBIC 5 CC EACH  Final   Culture   Final    NO GROWTH 5 DAYS Performed at [facility]    Report Status 02/03/2016 FINAL  Final      Anti-infectives    Start     Dose/Rate Route Frequency Ordered Stop   02/02/16 1000  fluconazole (DIFLUCAN) tablet 100 mg  Status:  Discontinued     100 mg Oral Daily 02/02/16 0937 02/04/16 1045   02/01/16 1600  piperacillin-tazobactam (ZOSYN) IVPB 3.375 g  Status:  Discontinued     3.375 g 12.5 mL/hr over 240 Minutes Intravenous Every 8 hours 02/01/16 1031 02/02/16 1011   01/30/16 1200  fluconazole (DIFLUCAN) IVPB 100 mg  Status:  Discontinued     100 mg 50 mL/hr over 60 Minutes Intravenous Every 24 hours 01/30/16 0952 02/02/16 0937   01/29/16 1400  piperacillin-tazobactam (ZOSYN) IVPB 2.25 g  Status:  Discontinued     2.25 g 100 mL/hr over 30 Minutes Intravenous Every 6 hours 01/29/16 0705 02/01/16 1030   01/29/16 0715  piperacillin-tazobactam (ZOSYN) IVPB 2.25 g     2.25 g 100 mL/hr over 30 Minutes Intravenous STAT 01/29/16  0704 01/29/16 0757       Radiology Studies: No results found.      Scheduled Meds: . sodium chloride   Intravenous Once  . sodium chloride   Intravenous Once  . antiseptic oral rinse  7 mL Mouth Rinse BID  . feeding supplement  1 Container Oral TID BM  . magic mouthwash  5 mL Oral QID   Continuous Infusions: . sodium chloride       LOS: 6 days    Time spent: 25 min    Seydina Holliman Juanetta Gosling, DO [facility] Pager [phone]  If 7PM-7AM, please contact night-coverage [email or web address] Password [facility] 02/04/2016, 12:26 PM

## 2016-02-04 NOTE — Progress Notes (Signed)
Telephone call placed to family. Spoke to son who replied that one of them will be in this morning to sign the consent for the blood transfusion.

## 2016-02-04 NOTE — Progress Notes (Signed)
Shift event note:  Notified by RN that pt has developed large, firm palpable mass to RLQ that is oozing blood from an injection site. Mass not present at 2300 when she rec'd her sq heparin. At bedside pt noted resting in NAD. Pt briefly became hypotensive and tachycardic but BP and HR have improved. Noted firm, palpable mass to RLQ that continues to ooze blood from injection site. Approx 10 cm in size. AM Hb noted to be 6.6 down from 8.4 yesterday am.  Assessment/Plan:   1. RLQ abd hematoma: At heparin injection site. ASA and sq heparin d/c'd. PT/INR PTT ordered. Thrombi pad dressing applied and secured w/ pressure dressing. Discussed pt w/ Dr Gerrit Friends w/ surgery service who has agreed to see pt within the hour. Will transfuse a unit of PRBC's. Follow coags. Will continue to monitor closely and defer further changes in plan to rounding MD pending surgery input.   Leanne Chang, NP-C [facility] Pager [phone]

## 2016-02-04 NOTE — Progress Notes (Signed)
Daily Progress Note   Patient Name: Lisa Shaffer       Date: 02/04/2016 DOB: 01-30-1946  Age: 70 y.o. MRN#: [id number] Attending Physician: Joseph Art, DO Primary Care Physician: Shirline Frees, NP Admit Date: 01/28/2016  Reason for Consultation/Follow-up: Establishing goals of care, Inpatient hospice referral, Psychosocial/spiritual support and Terminal Care  Subjective: Significant decline this AM. Developed large hematoma on her abdomen this AM. Labs suggest re-=feeding syndrome. She is much more lethargic, minimal PO intake since admission.   Length of Stay: 6  Current Medications: Scheduled Meds:  . sodium chloride   Intravenous Once  . sodium chloride   Intravenous Once  . antiseptic oral rinse  7 mL Mouth Rinse BID  . feeding supplement  1 Container Oral TID BM  . magic mouthwash  5 mL Oral QID    Continuous Infusions: . sodium chloride      PRN Meds: [DISCONTINUED] acetaminophen **OR** acetaminophen, acetaminophen **OR** acetaminophen, antiseptic oral rinse, glycopyrrolate **OR** glycopyrrolate **OR** glycopyrrolate, haloperidol **OR** haloperidol **OR** haloperidol lactate, HYDROmorphone (DILAUDID) injection, ondansetron **OR** ondansetron (ZOFRAN) IV, polyvinyl alcohol  Physical Exam          Vital Signs: BP 120/73 (BP Location: Right Arm)   Pulse (!) 108   Temp 98.2 F (36.8 C) (Oral)   Resp 18   Ht 4\' 8"  (1.422 m)   Wt 99.2 kg (218 lb 11.1 oz)   SpO2 100%   BMI 49.03 kg/m  SpO2: SpO2: 100 % O2 Device: O2 Device: Nasal Cannula O2 Flow Rate:    Intake/output summary:  Intake/Output Summary (Last 24 hours) at 02/04/16 1124 Last data filed at 02/04/16 0600  Gross per 24 hour  Intake               60 ml  Output                0 ml  Net               60 ml     LBM: Last BM Date: 02/04/16 Baseline Weight: Weight: 39.5 kg (87 lb 1.3 oz) Most recent weight: Weight: 99.2 kg (218 lb 11.1 oz)       Palliative Assessment/Data:      Patient Active Problem List   Diagnosis Date Noted  . Thrush 01/31/2016  .  Thrush of mouth and esophagus (HCC)   . Palliative care patient 01/30/2016  . Hypokalemia 01/29/2016  . Acute renal failure (HCC) 01/29/2016  . Lactic acidosis 01/29/2016  . ARF (acute renal failure) (HCC) 01/29/2016  . Pressure ulcer 01/29/2016  . Opacity of lung on imaging study   . Protein-calorie malnutrition, severe 10/09/2015  . Dysphagia 10/08/2015  . Anorexia 10/08/2015  . Nausea & vomiting 10/08/2015  . DM2 (diabetes mellitus, type 2) (HCC) 10/08/2015  . Volume depletion 10/08/2015  . AKI (acute kidney injury) (HCC) 10/08/2015  . Hypertension 10/08/2015  . History of recent stroke 10/08/2015  . Left hemiparesis (HCC) 10/08/2015  . Lung abscess (HCC) 10/08/2015  . Moderate dementia 12/31/2014  . Vitamin D deficiency 09/26/2014  . Hyperparathyroidism (HCC) 05/04/2014  . Chest pain, atypical 11/08/2012  . Situational mixed anxiety and depressive disorder 05/07/2011  . Chronic pain of right ankle 12/02/2010  . GOUT 12/18/2009  . Hypercalcemia 12/18/2009  . ARTHRITIS 12/17/2009  . STRESS REACTION, ACUTE 09/10/2009  . MALAISE AND FATIGUE 08/07/2009  . Diabetes mellitus type 2, uncomplicated (HCC) 02/04/2009  . OSTEOPENIA 08/07/2008  . EYE FLOATERS, LEFT 12/07/2007  . MEMORY LOSS 12/07/2007  . Hyperlipidemia 06/09/2007  . Essential hypertension 06/09/2007    Palliative Care Assessment & Plan   Patient Profile: 70 yo with late effect stroke complications-dysphagia, aspiration and lung abscess with failure to thrive and malnutrition.  Assessment: Transitioned to full comfort care- continues to decline with re-feeding syndrome and acute blood loss anemia with abdominal hematoma at heparin injection site as well as  severe weakness and immobility.  Recommendations/Plan:  Plan was originally for home hospice-given rapuid decline would be more appropriate for [facility].  Goals of Care and Additional Recommendations:  Limitations on Scope of Treatment: Full Comfort Care  Code Status:    Code Status Orders        Start     Ordered   02/04/16 1047  Do not attempt resuscitation (DNR)  Continuous    Question Answer Comment  In the event of cardiac or respiratory ARREST Do not call a "code blue"   In the event of cardiac or respiratory ARREST Do not perform Intubation, CPR, defibrillation or ACLS   In the event of cardiac or respiratory ARREST Use medication by any route, position, wound care, and other measures to relive pain and suffering. May use oxygen, suction and manual treatment of airway obstruction as needed for comfort.      02/04/16 1048    Code Status History    Date Active Date Inactive Code Status Order ID Comments User Context   01/31/2016 11:12 AM 02/04/2016 10:48 AM DNR [id number]  Edsel Petrin, DO Inpatient   01/29/2016  2:15 AM 01/30/2016  2:06 PM Full Code [id number]  Eduard Clos, MD Inpatient   10/08/2015  8:39 PM 10/15/2015  9:50 PM Full Code [id number]  Delano Metz, MD Inpatient    Advance Directive Documentation   Flowsheet Row Most Recent Value  Type of Advance Directive  Healthcare Power of Attorney  Pre-existing out of facility DNR order (yellow form or pink MOST form)  No data  "MOST" Form in Place?  No data       Prognosis:   < 2 weeks  Discharge Planning:  Hospice facility  Care plan was discussed with Dr. Benjamine Mola, son and husband.  Thank you for allowing the Palliative Medicine Team to assist in the care of this patient.   Time In: 9 Time  Out: 10 Total Time 60 minutes Prolonged Time Billed no      Greater than 50%  of this time was spent counseling and coordinating care related to the above assessment and plan.  GOLDING,ELIZABETH,  DO  Please contact Palliative Medicine Team phone at [phone] for questions and concerns.

## 2016-02-04 NOTE — Telephone Encounter (Signed)
Yes, I will be her PCP.
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
## 2016-02-04 NOTE — Telephone Encounter (Signed)
Amy at [facility] notified. Thanks.

## 2016-02-04 NOTE — Telephone Encounter (Signed)
Notified hospice care that [facility] agreed to be attending physician. Hospice also would like to know if it is ok with Lisa Shaffer to activate hospice orders?

## 2016-02-05 DIAGNOSIS — R4182 Altered mental status, unspecified: Secondary | ICD-10-CM

## 2016-02-05 NOTE — Clinical Social Work Note (Signed)
Clinical Social Work Assessment  Patient Details  Name: Lisa Shaffer MRN: [id number] Date of Birth: 10/18/1945  Date of referral:  02/05/16               Reason for consult:  Facility Placement                Permission sought to share information with:  Facility Sport and exercise psychologist, Case Manager, Family Supports Permission granted to share information::     Name::        Agency::   [employer].  Relationship::   Spouse, Son   Contact Information:   Tabita Corbo: [phone], [place] Schillings [phone]  Housing/Transportation Living arrangements for the past 2 months:  Single Family Home Source of Information:  Spouse Patient Interpreter Needed:  None Criminal Activity/Legal Involvement Pertinent to Current Situation/Hospitalization:  No  Significant Relationships:  Adult Children, Spouse Lives with:  Spouse Do you feel safe going back to the place where you live?  Yes Need for family participation in patient care:  Yes (Comment)  Care giving concerns: Patient and family planned for home with hospice. Dr. Hilma Favors expalined patient health has decline and it will be hard on family to care for patient at this time. Patient has wound care that needs to be managed, and her bleeding needs to be stabilized. Patient husband has agreed to residential hospice-[facility].    Social Worker assessment / plan:  LCSWA met with patient husband, SueThomas of [employer] and Dr. Hilma Favors. Discussed patient decline in health and symptom management in residential hospice vs. Home. Discussed facility supporting family through this time. Pt. Husband expressed feeling hopeful that patient will get better and be able to return home.   Plan: Assist family and Optometrist with patient transition  Employment status:  Retired Nurse, adult PT Recommendations:   ([place]) Information / Referral to community resources:  Other (Hospice)  Patient/Family's Response to  care:Agreeable.   Patient/Family's Understanding of and Emotional Response to Diagnosis, Current Treatment, and Prognosis: "Taking care of my wife is not a burden but I know it will take a load off." Patient family is appreciate of the support and understands this is best treatment for the patient.   Emotional Assessment Appearance:  Appears stated age Attitude/Demeanor/Rapport:    Affect (typically observed):  Calm Orientation:  Oriented to Self Alcohol / Substance use:  Not Applicable Psych involvement (Current and /or in the community):  No (Comment)  Discharge Needs  Concerns to be addressed:  Care Coordination Readmission within the last 30 days:    Current discharge risk:  Dependent with Mobility Barriers to Discharge:  No Barriers to Discharge    Lia Hopping, LCSW 02/05/2016, 12:24 PM

## 2016-02-05 NOTE — Progress Notes (Signed)
[facility]    [facility] Note for [employer] received from [place] for family interest in [place] with request to transfer today. Chart reviewed and there is bed availablity today.   Met with patient's spouse to confirm interest and explain services. Family is agreeable for transfer today.  Dr. Orpah Melter to assume care per family request.    Please fax discharge summary to to [phone].   RN please call report to [phone]   Please arrange transport as soon as possible.   Thank you!    Mickie Kay, [place]  [phone]

## 2016-02-05 NOTE — Progress Notes (Signed)
Report Given to Nurse. Faxed DC Summary DNR Placed.  PTAR called. Family informed.   Vivi Barrack, Theresia Majors, MSW Clinical Social Worker 5E and Psychiatric Service Line [phone] 02/05/2016  2:05 PM

## 2016-02-05 NOTE — Progress Notes (Signed)
Report called to Bonna Gains RN at [facility].  Questions answered

## 2016-02-05 NOTE — Discharge Summary (Signed)
Physician Discharge Summary  Lisa Shaffer ZOX:[id number] DOB: [birth year] DOA: 01/28/2016  PCP: Shirline Frees, NP  Admit date: 01/28/2016 Discharge date: 02/05/2016  Admitted From: Home (Home, ALF, ILF, SNF) Disposition:  Hospice home  Recommendations for Outpatient Follow-up:  1. Patient discharged to residential hospice  Home Health: None Equipment/Devices: N/A  Discharge Condition: Hospice CODE STATUS: DNR/DNI Diet recommendation: Diet for comfort, she was on clear liquids in the hospital  Brief/Interim Summary: Lisa Shaffer is a 70 y.o. female with stroke with left-sided hemiplegia, dementia, diabetes mellitus, hypertension, chronic anemia and recently diagnosed left lung abscess was treated with Augmentin prolonged course and also had bronchoscopy last month was brought to the ER because of poor oral intake over the last few weeks. Patient's husband states patient may have lost considerable weight over the last 2 months. As per the patient's husband patient has not been eating well for last few weeks and particularly last 2 weeks. Patient throws up whenever she eats, denies any diarrhea. Unable to take her medications for at least last 2 weeks. Patient was recently prescribed Megace by patient's primary care physician. On my exam patient is not in distress. Patient's abdomen appears benign. Patient has been throwing up after the admission. Labs reveal acute renal failure with elevated lactate levels and chest x-ray shows improving left midlung zone cavitary lesion pneumonia. Patient is being admitted for acute renal failure probably from dehydration and persistent nausea vomiting  Discharge Diagnoses:  Principal Problem:   Acute renal failure (HCC) Active Problems:   Essential hypertension   Nausea & vomiting   DM2 (diabetes mellitus, type 2) (HCC)   Hypokalemia   Lactic acidosis   ARF (acute renal failure) (HCC)   Pressure ulcer   Palliative care patient   Camelia Phenes of  mouth and esophagus (HCC)   Altered mental status   Acute renal failure with metabolic acidosis -  -Creatinine was 2.3 at the time of admission, baseline 1.0. -This is improved after IV fluid hydration.  Persistent nausea vomiting-  -? 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 -restarted IV reglan- change to PO  -Tolerated clears very well, had low potassium and low magnesium, likely refeeding syndrome  Elevated lactate levels - probably from persistent nausea and vomiting and poor oral intake and dehydration. Continue with hydration  -improved  History of lung abscesseswith chest x-ray showing improvement  -d/c zosyn as cultures negative  Diabetes mellitus type 2-  -no treatment currently as blood sugars low  Hypertension -  -hold PO meds  History of dementia. -appears advances  Chronic anemia- follow CBC. -transfuse 1 unit -? Volume dilution -no sign of active bleeding -heme test stools  History of strokewith left-sided hemiplegia on aspirin.            Hypokalemia                       -replaced IV                       -Mg normal          Severe malnutrition in context of acute illness/injury          -Has had poor oral intake for some time, after restarted on diet  potassium went down as well as magnesium, likely refeeding syndrome.          Palliative and hospice         -Patient has advanced dementia, poor oral intake and history of aspiration resulted in an lung abscess.         -Seen by palliative care and after meeting with the family recommended. Comfort.         -Patient will be discharged to a local hospice home today.  Discharge Instructions  Discharge Instructions    Diet - low sodium heart healthy    Complete by:  As directed    Increase activity slowly    Complete by:  As directed       Medication List    STOP taking these medications   amoxicillin-clavulanate 875-125 MG tablet Commonly known as:  AUGMENTIN   predniSONE 10 MG tablet Commonly known as:  DELTASONE     TAKE these medications   atorvastatin 40 MG tablet Commonly known as:  LIPITOR Take 40 mg by mouth daily.   clopidogrel 75 MG tablet Commonly known as:  PLAVIX Take 75 mg by mouth daily.   donepezil 10 MG tablet Commonly known as:  ARICEPT TAKE 1 TABLET(10 MG) BY MOUTH AT BEDTIME   feeding supplement (ENSURE ENLIVE) Liqd Take 237 mLs by mouth 2 (two) times daily between meals.   memantine 28 MG Cp24 24 hr capsule Commonly known as:  NAMENDA XR Take 1 capsule (28 mg total) by mouth daily.   metoCLOPramide 5 MG tablet Commonly known as:  REGLAN Take 1 tablet (5 mg total) by mouth 4 (four) times daily -  before meals and at bedtime.   mirtazapine 15 MG tablet Commonly known as:  REMERON Take one half a pill at night What changed:  how much to take  how to take this  when to take this  additional instructions   NIFEdipine 90 MG 24 hr tablet Commonly known as:  PROCARDIA XL/ADALAT-CC Take 1 tablet (90 mg total) by mouth daily.   QUEtiapine 25 MG tablet Commonly known as:  SEROQUEL TAKE 1/2 TABLET BY MOUTH AT BEDTIME AS NEEDED. INCREASE CONFUSION   saccharomyces boulardii 250 MG capsule Commonly known as:  FLORASTOR Take 1 capsule (250 mg total) by mouth 2 (two) times daily.   sertraline 100 MG tablet Commonly known as:  ZOLOFT TAKE 1 TABLET BY MOUTH DAILY      Follow-up Information    Hospice at [facility] .   Specialty:  Hospice and Palliative Medicine Why:  hospice services Contact information: [address] [place] [address] [phone]          No Known Allergies  Consultations:  Palliative   Procedures/Studies: Ct Abdomen Pelvis Wo Contrast  Result Date: 01/29/2016 CLINICAL  DATA:  70 year old female with vomiting and weight loss EXAM: CT ABDOMEN AND PELVIS WITHOUT CONTRAST TECHNIQUE: Multidetector CT imaging of the abdomen and pelvis was performed following the standard protocol without IV contrast. COMPARISON:  Chest CT dated 12/19/2015 and CT of the abdomen pelvis dated 10/03/2015 FINDINGS: Evaluation of this exam is limited in the absence of intravenous contrast. Bibasilar linear atelectasis/ scarring. Small focal nodularity at the lung bases, likely scarring. There is coronary vascular calcification. No intra-abdominal free air or free fluid. Small left hepatic hypodense lesions measuring up to 12 mm are not well characterized but may represent cysts or hemangioma. These appear stable compared to the prior study. An ill-defined 1 cm hypodense lesion is  also noted in the dome of the liver (series 2, image 8) which is also incompletely characterized on this noncontrast CT. MRI may provide better characterization. The gallbladder pancreas, spleen, adrenal glands, kidneys, visualized ureters, and urinary bladder appear unremarkable. A Foley catheter is noted within the urinary bladder. The uterus is grossly unremarkable and a calcified posterior uterine fibroid noted. The ovaries are grossly unremarkable as well. Evaluation of the bowel is limited in the absence of oral contrast. There is no evidence of bowel obstruction or active inflammation. Normal appendix. There is moderate aortoiliac atherosclerotic disease. Evaluation of the vasculature is limited in the absence of intravenous contrast. No portal venous gas identified. There is no adenopathy. The abdominal wall soft tissues appear unremarkable there is osteopenia with degenerative changes of the spine and hips. Grade 1 L4-L5 anterolisthesis. No acute fracture. IMPRESSION: No acute intra-abdominal or pelvic pathology. Multiple small hepatic hypodense lesions, incompletely characterized but appears stable compared the prior study.  MRI may provide better characterization if clinically indicated. Electronically Signed   By: Elgie Collard M.D.   On: 01/29/2016 03:41  Ct Head Wo Contrast  Result Date: 01/29/2016 CLINICAL DATA:  70 year old female with acute encephalopathy. History of hypertension and CVA and dementia. EXAM: CT HEAD WITHOUT CONTRAST TECHNIQUE: Contiguous axial images were obtained from the base of the skull through the vertex without intravenous contrast. COMPARISON:  Brain MRI dated 07/21/2012 FINDINGS: Evaluation is limited due to motion artifact. There is moderate age-related atrophy and chronic microvascular ischemic changes. A focal area of low attenuation in the left frontal periventricular white matter and centrum semiovale (series 3, image 20) likely sequela of chronic microvascular changes. There is a focal area of old infarct and encephalomalacia in the right side of the pons. There is no acute intracranial hemorrhage. No mass effect or midline shift noted. The visualized paranasal sinuses and mastoid air cells are clear. The calvarium is intact. IMPRESSION: No acute intracranial hemorrhage. Age-related atrophy and chronic microvascular ischemic disease. Old pontine infarct. If symptoms persist and there are no contraindications, MRI may provide better evaluation if clinically indicated. Electronically Signed   By: Elgie Collard M.D.   On: 01/29/2016 05:21  Mr Brain Wo Contrast  Result Date: 01/30/2016 CLINICAL DATA:  Altered mental status. Left-sided hemiplegia. Diabetes, hypertension, dementia EXAM: MRI HEAD WITHOUT CONTRAST TECHNIQUE: Multiplanar, multiecho pulse sequences of the brain and surrounding structures were obtained without intravenous contrast. COMPARISON:  CT 01/29/2016.  MRI 05/21/2013 FINDINGS: Moderate to advanced atrophy with progression since 2014. Negative for hydrocephalus Negative for acute infarct. Chronic microvascular ischemic changes throughout the white matter with progression  since 2014. Large chronic infarct in the right pons has developed since the prior MRI. Chronic ischemia in the brachium pontis and right medulla. Negative for intracranial hemorrhage. Negative for mass or edema.  No shift of the midline structures. Paranasal sinuses clear. No orbital mass lesion. Bilateral cataract extraction. Pituitary not enlarged. IMPRESSION: Progressive atrophy and chronic ischemic change. Large chronic infarct right pons No acute abnormality. Electronically Signed   By: Marlan Palau M.D.   On: 01/30/2016 21:56  Dg Abd Acute W/chest  Result Date: 01/28/2016 CLINICAL DATA:  Increased weakness, weight loss. EXAM: DG ABDOMEN ACUTE W/ 1V CHEST COMPARISON:  Chest radiograph - 10/08/2015; 10/01/2015; chest CT- 12/20/2015; CT the chest, abdomen pelvis - 10/03/2015 FINDINGS: Grossly unchanged cardiac silhouette and mediastinal contours. The lungs remain hyperexpanded with flattening of the diaphragms and thinning of the biapical pulmonary parenchyma. Interval decrease in size of  previously noted left lower lung cavitary pneumonia with residual approximately 2.8 x 2.0 cm nodular opacity overlying the left mid lung. Grossly unchanged right basilar linear heterogeneous opacities favored to represent atelectasis scar. No new focal airspace opacities. No pleural effusion or pneumothorax. No evidence of edema. Nonobstructive bowel gas pattern. No pneumoperitoneum, pneumatosis or portal venous gas. Calcifications overlying the lower pelvis are favored to represent a combination of phleboliths and partially calcified degenerating fibroids. No acute or aggressive osseous abnormalities. IMPRESSION: 1. Suspected improved cavitary left mid lung pneumonia with residual approximately 2.8 cm nodular airspace opacity. No new focal airspace opacities to suggest new or worsening pneumonia. A follow-up chest radiograph in 3 to 4 weeks after treatment is recommended to ensure resolution. 2. Nonobstructive bowel gas  pattern. Electronically Signed   By: Simonne Come M.D.   On: 01/28/2016 22:08   (Echo, Carotid, EGD, Colonoscopy, ERCP)    Subjective:   Discharge Exam: Vitals:   02/04/16 2056 02/05/16 0405  BP: (!) 113/93   Pulse: (!) 114   Resp: 16 10  Temp: 98 F (36.7 C)    Vitals:   02/04/16 0500 02/04/16 1404 02/04/16 2056 02/05/16 0405  BP: 120/73 96/61 (!) 113/93   Pulse: (!) 108 (!) 104 (!) 114   Resp: 18 18 16 10   Temp: 98.2 F (36.8 C) 98.9 F (37.2 C) 98 F (36.7 C)   TempSrc: Oral Oral Oral   SpO2: 100% 100% 100%   Weight: 99.2 kg (218 lb 11.1 oz)     Height:        General: Pt is alert, awake, not in acute distress Cardiovascular: RRR, S1/S2 +, no rubs, no gallops Respiratory: CTA bilaterally, no wheezing, no rhonchi Abdominal: Soft, NT, ND, bowel sounds + Extremities: no edema, no cyanosis    The results of significant diagnostics from this hospitalization (including imaging, microbiology, ancillary and laboratory) are listed below for reference.     Microbiology: Recent Results (from the past 240 hour(s))  MRSA PCR Screening     Status: None   Collection Time: 01/29/16  2:50 AM  Result Value Ref Range Status   MRSA by PCR NEGATIVE NEGATIVE Final    Comment:        The GeneXpert MRSA Assay (FDA approved for NASAL specimens only), is one component of a comprehensive MRSA colonization surveillance program. It is not intended to diagnose MRSA infection nor to guide or monitor treatment for MRSA infections.   Culture, blood (routine x 2)     Status: None   Collection Time: 01/29/16  8:28 AM  Result Value Ref Range Status   Specimen Description BLOOD RIGHT HAND  Final   Special Requests BOTTLES DRAWN AEROBIC ONLY ARPEDB 4CC  Final   Culture   Final    NO GROWTH 5 DAYS Performed at [facility]    Report Status 02/03/2016 FINAL  Final  Culture, blood (routine x 2)     Status: None   Collection Time: 01/29/16  8:29 AM  Result Value Ref Range  Status   Specimen Description BLOOD RIGHT ARM  Final   Special Requests BOTTLES DRAWN AEROBIC AND ANAEROBIC 5 CC EACH  Final   Culture   Final    NO GROWTH 5 DAYS Performed at [facility]    Report Status 02/03/2016 FINAL  Final     Labs: BNP (last 3 results) No results for input(s): BNP in the last 8760 hours. Basic Metabolic Panel:  Recent Labs Lab 01/31/16  9147 02/01/16 0753 02/03/16 0437 02/03/16 1622 02/04/16 0456  NA 144 139 140 138 140  K 3.4* 4.5 2.2* 3.0* 2.4*  CL 118* 111 112* 110 112*  CO2 18* 19* 20* 19* 16*  GLUCOSE 205* 89 91 200* 143*  BUN 16 10 8 9 9   CREATININE 1.54* 1.41* 1.33* 1.67* 1.69*  CALCIUM 7.3* 7.7* 8.1* 8.4* 7.9*  MG  --   --  1.0*  --   --    Liver Function Tests:  Recent Labs Lab 01/30/16 0326  AST 14*  ALT 7*  ALKPHOS 75  BILITOT 1.4*  PROT 5.6*  ALBUMIN 2.7*   No results for input(s): LIPASE, AMYLASE in the last 168 hours. No results for input(s): AMMONIA in the last 168 hours. CBC:  Recent Labs Lab 01/30/16 0326 01/31/16 0442 02/01/16 0753 02/03/16 0437 02/04/16 0456  WBC 6.6 9.1 9.6 7.9 11.6*  HGB 7.0* 6.2* 8.5* 8.8* 6.6*  HCT 20.5* 17.7* 24.3* 24.2* 18.4*  MCV 73.5* 73.8* 75.9* 75.4* 75.7*  PLT 161 160 156 145* 142*   Cardiac Enzymes:  Recent Labs Lab 01/29/16 1611 01/29/16 2345 01/30/16 0326  TROPONINI 0.03* 0.03* 0.03*   BNP: Invalid input(s): POCBNP CBG:  Recent Labs Lab 02/03/16 1843 02/03/16 1956 02/03/16 2349 02/04/16 0346 02/04/16 0747  GLUCAP 165* 162* 121* 135* 135*   D-Dimer No results for input(s): DDIMER in the last 72 hours. Hgb A1c No results for input(s): HGBA1C in the last 72 hours. Lipid Profile No results for input(s): CHOL, HDL, LDLCALC, TRIG, CHOLHDL, LDLDIRECT in the last 72 hours. Thyroid function studies No results for input(s): TSH, T4TOTAL, T3FREE, THYROIDAB in the last 72 hours.  Invalid input(s): FREET3 Anemia work up No results for input(s): VITAMINB12,  FOLATE, FERRITIN, TIBC, IRON, RETICCTPCT in the last 72 hours. Urinalysis    Component Value Date/Time   COLORURINE YELLOW 01/28/2016 1901   APPEARANCEUR CLOUDY (A) 01/28/2016 1901   LABSPEC 1.015 01/28/2016 1901   PHURINE 5.5 01/28/2016 1901   GLUCOSEU NEGATIVE 01/28/2016 1901   GLUCOSEU NEGATIVE 06/06/2007 1004   HGBUR NEGATIVE 01/28/2016 1901   BILIRUBINUR SMALL (A) 01/28/2016 1901   KETONESUR NEGATIVE 01/28/2016 1901   PROTEINUR NEGATIVE 01/28/2016 1901   UROBILINOGEN 0.2 mg/dL 82/95/6213 0865   NITRITE NEGATIVE 01/28/2016 1901   LEUKOCYTESUR NEGATIVE 01/28/2016 1901   Sepsis Labs Invalid input(s): PROCALCITONIN,  WBC,  LACTICIDVEN Microbiology Recent Results (from the past 240 hour(s))  MRSA PCR Screening     Status: None   Collection Time: 01/29/16  2:50 AM  Result Value Ref Range Status   MRSA by PCR NEGATIVE NEGATIVE Final    Comment:        The GeneXpert MRSA Assay (FDA approved for NASAL specimens only), is one component of a comprehensive MRSA colonization surveillance program. It is not intended to diagnose MRSA infection nor to guide or monitor treatment for MRSA infections.   Culture, blood (routine x 2)     Status: None   Collection Time: 01/29/16  8:28 AM  Result Value Ref Range Status   Specimen Description BLOOD RIGHT HAND  Final   Special Requests BOTTLES DRAWN AEROBIC ONLY ARPEDB 4CC  Final   Culture   Final    NO GROWTH 5 DAYS Performed at [facility]    Report Status 02/03/2016 FINAL  Final  Culture, blood (routine x 2)     Status: None   Collection Time: 01/29/16  8:29 AM  Result Value Ref Range Status  Specimen Description BLOOD RIGHT ARM  Final   Special Requests BOTTLES DRAWN AEROBIC AND ANAEROBIC 5 CC EACH  Final   Culture   Final    NO GROWTH 5 DAYS Performed at [facility]    Report Status 02/03/2016 FINAL  Final     Time coordinating discharge: Over 30 minutes  SIGNED:   Clint Lipps, MD  [facility] 02/05/2016, 1:24 PM Pager   If 7PM-7AM, please contact night-coverage [email or web address] Password [email or web address]

## 2016-02-05 NOTE — Progress Notes (Signed)
Daily Progress Note   Patient Name: Lisa Shaffer       Date: 02/05/2016 DOB: 1945-11-22  Age: 70 y.o. MRN#: [id number] Attending Physician: Clydia Llano, MD Primary Care Physician: Shirline Frees, NP Admit Date: 01/28/2016  Reason for Consultation/Follow-up: Disposition, Establishing goals of care and Terminal Care  Subjective: Remains mostly unresponsive with transient periods of awareness. Husband at bedside. They have agreed on hospice facility vs home with hospice.  Length of Stay: 7  Current Medications: Scheduled Meds:  . sodium chloride   Intravenous Once  . sodium chloride   Intravenous Once  . antiseptic oral rinse  7 mL Mouth Rinse BID  . feeding supplement  1 Container Oral TID BM  . magic mouthwash  5 mL Oral QID    Continuous Infusions: . sodium chloride      PRN Meds: [DISCONTINUED] acetaminophen **OR** acetaminophen, acetaminophen **OR** acetaminophen, antiseptic oral rinse, glycopyrrolate **OR** glycopyrrolate **OR** glycopyrrolate, haloperidol **OR** haloperidol **OR** haloperidol lactate, HYDROmorphone (DILAUDID) injection, ondansetron **OR** ondansetron (ZOFRAN) IV, polyvinyl alcohol  Physical Exam          Vital Signs: BP (!) 113/93 (BP Location: Right Arm)   Pulse (!) 114   Temp 98 F (36.7 C) (Oral)   Resp 10   Ht 4\' 8"  (1.422 m)   Wt 99.2 kg (218 lb 11.1 oz)   SpO2 100%   BMI 49.03 kg/m  SpO2: SpO2: 100 % O2 Device: O2 Device: Not Delivered O2 Flow Rate:    Intake/output summary:  Intake/Output Summary (Last 24 hours) at 02/05/16 1211 Last data filed at 02/05/16 0146  Gross per 24 hour  Intake              130 ml  Output                0 ml  Net              130 ml   LBM: Last BM Date: 02/04/16 Baseline Weight: Weight: 39.5 kg (87 lb 1.3  oz) Most recent weight: Weight: 99.2 kg (218 lb 11.1 oz)       Palliative Assessment/Data:    Flowsheet Rows   Flowsheet Row Most Recent Value  Intake Tab  Referral Department  Hospitalist  Unit at Time of Referral  ICU  Palliative Care Primary  Diagnosis  Neurology  Date Notified  01/29/16  Palliative Care Type  New Palliative care  Reason for referral  Clarify Goals of Care  Date of Admission  01/28/16  Date first seen by Palliative Care  01/31/16  # of days Palliative referral response time  2 Day(s)  # of days IP prior to Palliative referral  1  Clinical Assessment  Psychosocial & Spiritual Assessment  Palliative Care Outcomes      Patient Active Problem List   Diagnosis Date Noted  . Altered mental status   . Thrush 01/31/2016  . Thrush of mouth and esophagus (HCC)   . Palliative care patient 01/30/2016  . Hypokalemia 01/29/2016  . Acute renal failure (HCC) 01/29/2016  . Lactic acidosis 01/29/2016  . ARF (acute renal failure) (HCC) 01/29/2016  . Pressure ulcer 01/29/2016  . Opacity of lung on imaging study   . Protein-calorie malnutrition, severe 10/09/2015  . Dysphagia 10/08/2015  . Anorexia 10/08/2015  . Nausea & vomiting 10/08/2015  . DM2 (diabetes mellitus, type 2) (HCC) 10/08/2015  . Volume depletion 10/08/2015  . AKI (acute kidney injury) (HCC) 10/08/2015  . Hypertension 10/08/2015  . History of recent stroke 10/08/2015  . Left hemiparesis (HCC) 10/08/2015  . Lung abscess (HCC) 10/08/2015  . Moderate dementia 12/31/2014  . Vitamin D deficiency 09/26/2014  . Hyperparathyroidism (HCC) 05/04/2014  . Chest pain, atypical 11/08/2012  . Situational mixed anxiety and depressive disorder 05/07/2011  . Chronic pain of right ankle 12/02/2010  . GOUT 12/18/2009  . Hypercalcemia 12/18/2009  . ARTHRITIS 12/17/2009  . STRESS REACTION, ACUTE 09/10/2009  . MALAISE AND FATIGUE 08/07/2009  . Diabetes mellitus type 2, uncomplicated (HCC) 02/04/2009  . OSTEOPENIA  08/07/2008  . EYE FLOATERS, LEFT 12/07/2007  . MEMORY LOSS 12/07/2007  . Hyperlipidemia 06/09/2007  . Essential hypertension 06/09/2007    Palliative Care Assessment & Plan   Patient Profile: 70 yo with late effect stroke complications-dysphagia, aspiration and lung abscess with failure to thrive and malnutrition.   Assessment: Actively dying. Transitioned to full comfort care. All aggressive interventions have been discontinued. She has pain and dyspnea PRNs and palliative prophylaxis ordered.  Recommendations/Plan:  Transfer to [facility] when bed is available.  Goals of Care and Additional Recommendations:  Limitations on Scope of Treatment: Full Comfort Care  Code Status:    Code Status Orders        Start     Ordered   02/04/16 1047  Do not attempt resuscitation (DNR)  Continuous    Question Answer Comment  In the event of cardiac or respiratory ARREST Do not call a "code blue"   In the event of cardiac or respiratory ARREST Do not perform Intubation, CPR, defibrillation or ACLS   In the event of cardiac or respiratory ARREST Use medication by any route, position, wound care, and other measures to relive pain and suffering. May use oxygen, suction and manual treatment of airway obstruction as needed for comfort.      02/04/16 1048    Code Status History    Date Active Date Inactive Code Status Order ID Comments User Context   01/31/2016 11:12 AM 02/04/2016 10:48 AM DNR [id number]  Edsel Petrin, DO Inpatient   01/29/2016  2:15 AM 01/30/2016  2:06 PM Full Code [id number]  Eduard Clos, MD Inpatient   10/08/2015  8:39 PM 10/15/2015  9:50 PM Full Code [id number]  Delano Metz, MD Inpatient    Advance Directive Documentation   Flowsheet Row Most Recent  Value  Type of Advance Directive  Healthcare Power of Attorney  Pre-existing out of facility DNR order (yellow form or pink MOST form)  No data  "MOST" Form in Place?  No data       Prognosis:   < 2  weeks  Discharge Planning:  Hospice facility  Care plan was discussed with Husband, CSW and Hospice RN Liaison   Thank you for allowing the Palliative Medicine Team to assist in the care of this patient.   Time In: 11:30 Time Out: 12:05 Total Time 35 min Prolonged Time Billed  no       Greater than 50%  of this time was spent counseling and coordinating care related to the above assessment and plan.  GOLDING,ELIZABETH, DO  Please contact Palliative Medicine Team phone at [phone] for questions and concerns.

## 2016-02-08 LAB — TYPE AND SCREEN
ABO/RH(D): O POS
Antibody Screen: NEGATIVE
Unit division: 0
Unit division: 0

## 2016-02-10 ENCOUNTER — Ambulatory Visit: Payer: Medicare Other | Admitting: Neurology

## 2016-02-10 LAB — ACID FAST CULTURE WITH REFLEXED SENSITIVITIES (MYCOBACTERIA): Acid Fast Culture: NEGATIVE

## 2016-02-14 ENCOUNTER — Ambulatory Visit: Payer: Medicare Other | Admitting: Adult Health

## 2016-03-06 DEATH — deceased

## 2017-03-26 ENCOUNTER — Encounter: Payer: Self-pay | Admitting: Adult Health

## 2017-11-03 IMAGING — DX DG CHEST 2V
2 series · 2 of 2 positions shown · non-contrast
Comparison: None

CLINICAL DATA: Recent weight loss, dry cough for 2 weeks,
hypertension, diabetes mellitus, dementia, prior stroke

EXAM:
CHEST  2 VIEW

[chest lat]
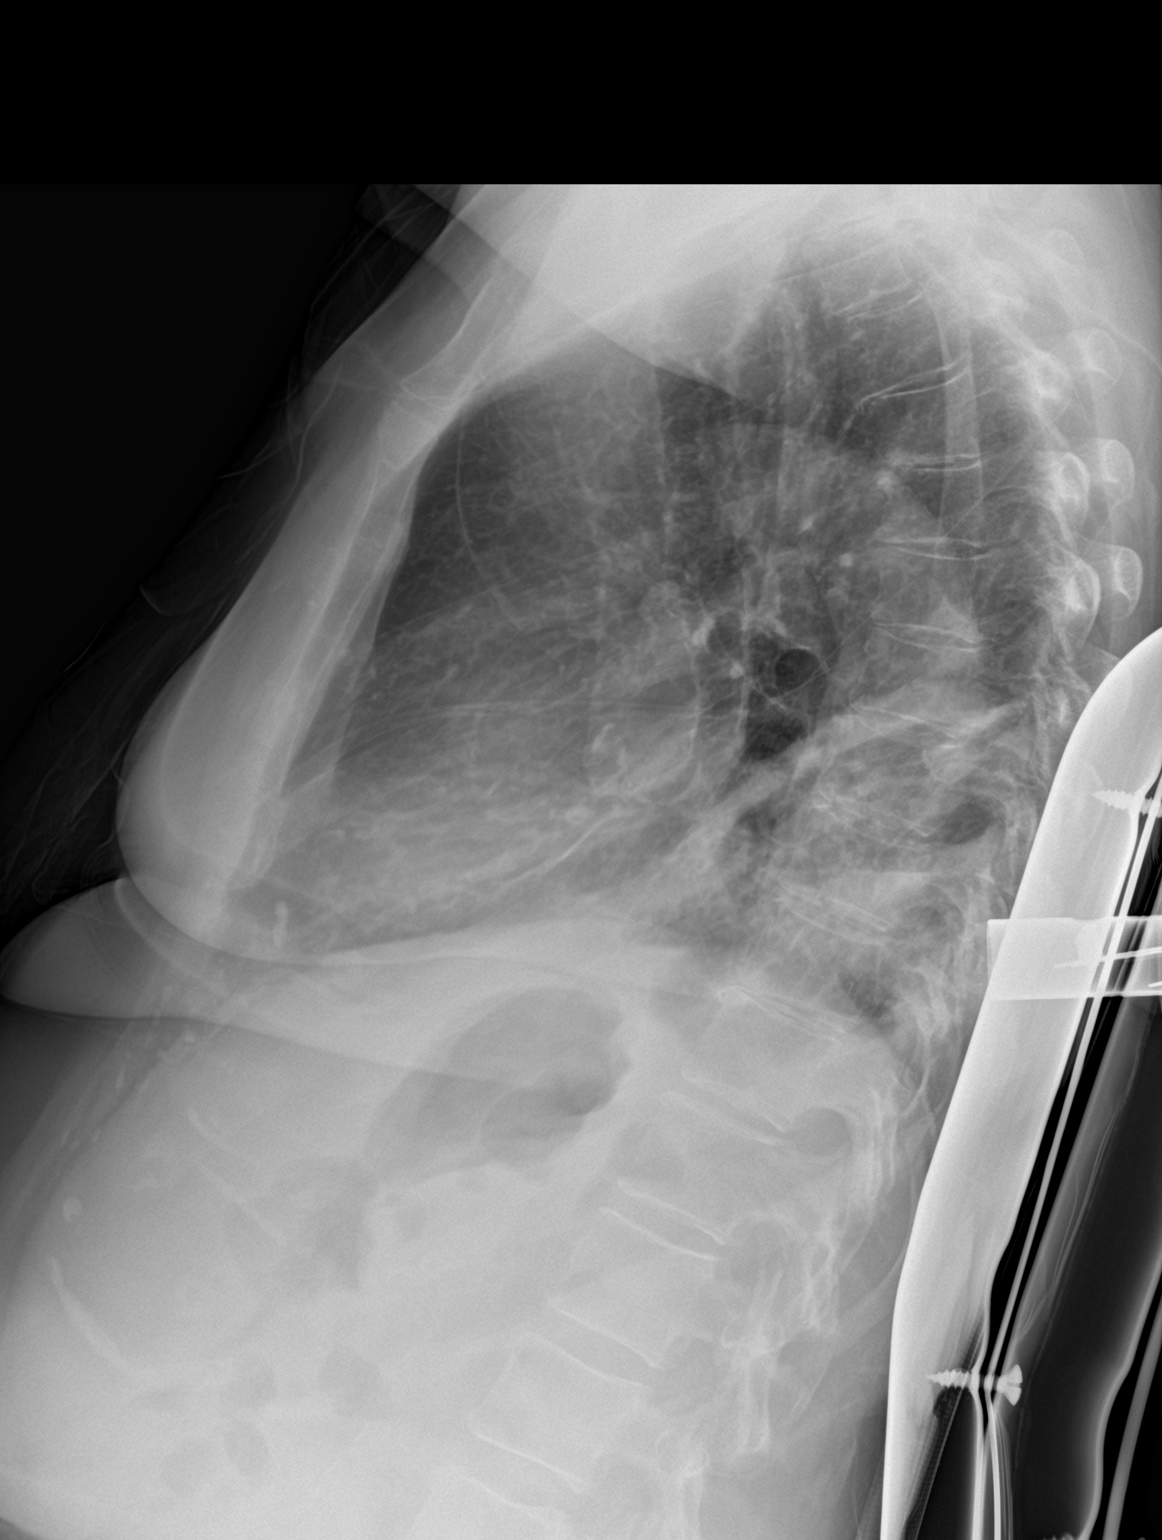

[chest ap]
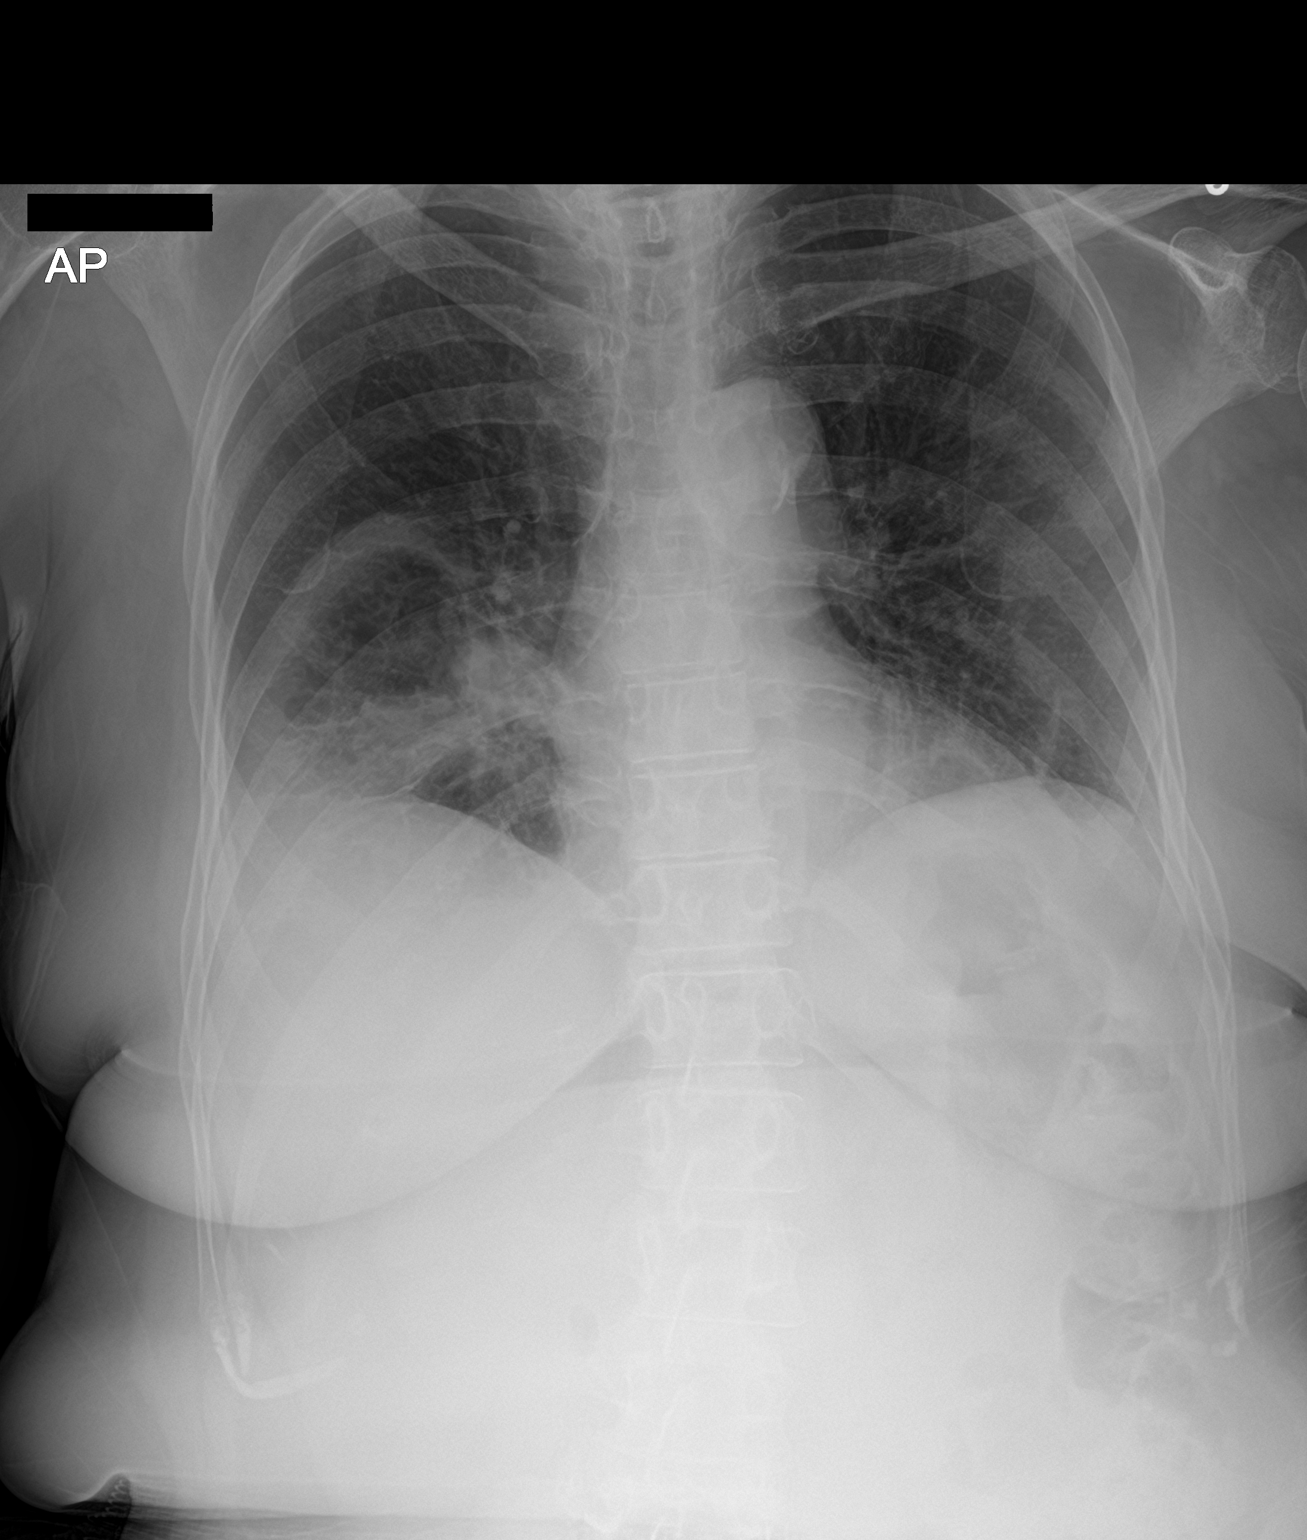

[2 of 2 positions shown; findings below may reference images not displayed]

FINDINGS: Normal heart size, mediastinal contours and pulmonary vascularity.

Atherosclerotic calcification aorta.

Minimal atelectasis at LEFT base.

Significant opacity at the RIGHT lower lobe with central lucency
likely air.

Findings concerning for a cavitary lesion at the RIGHT lower lobe.

On lateral view this has a somewhat more well defined anterior
margin and a diaphragmatic hernia is not completely excluded.

Remaining lungs clear.

No definite pleural effusion or pneumothorax.

Bones diffusely demineralized.
IMPRESSION: RIGHT lower lobe process containing opacity and potential central
cavitation, question cavitary mass or infection, diaphragmatic
hernia not completely excluded.

Further assessment by CT chest with contrast recommended to exclude
cavitary process.

These results will be called to the ordering clinician or
representative by the Radiologist Assistant, and communication
documented in the PACS or zVision Dashboard.

## 2017-11-10 IMAGING — CR DG CHEST 2V
2 series · 2 of 2 positions shown · non-contrast
Comparison: CT scan of October 03, 2015. Radiograph October 01, 2015.

CLINICAL DATA: Pulmonary abscess.

EXAM:
CHEST  2 VIEW

[w chest lat]
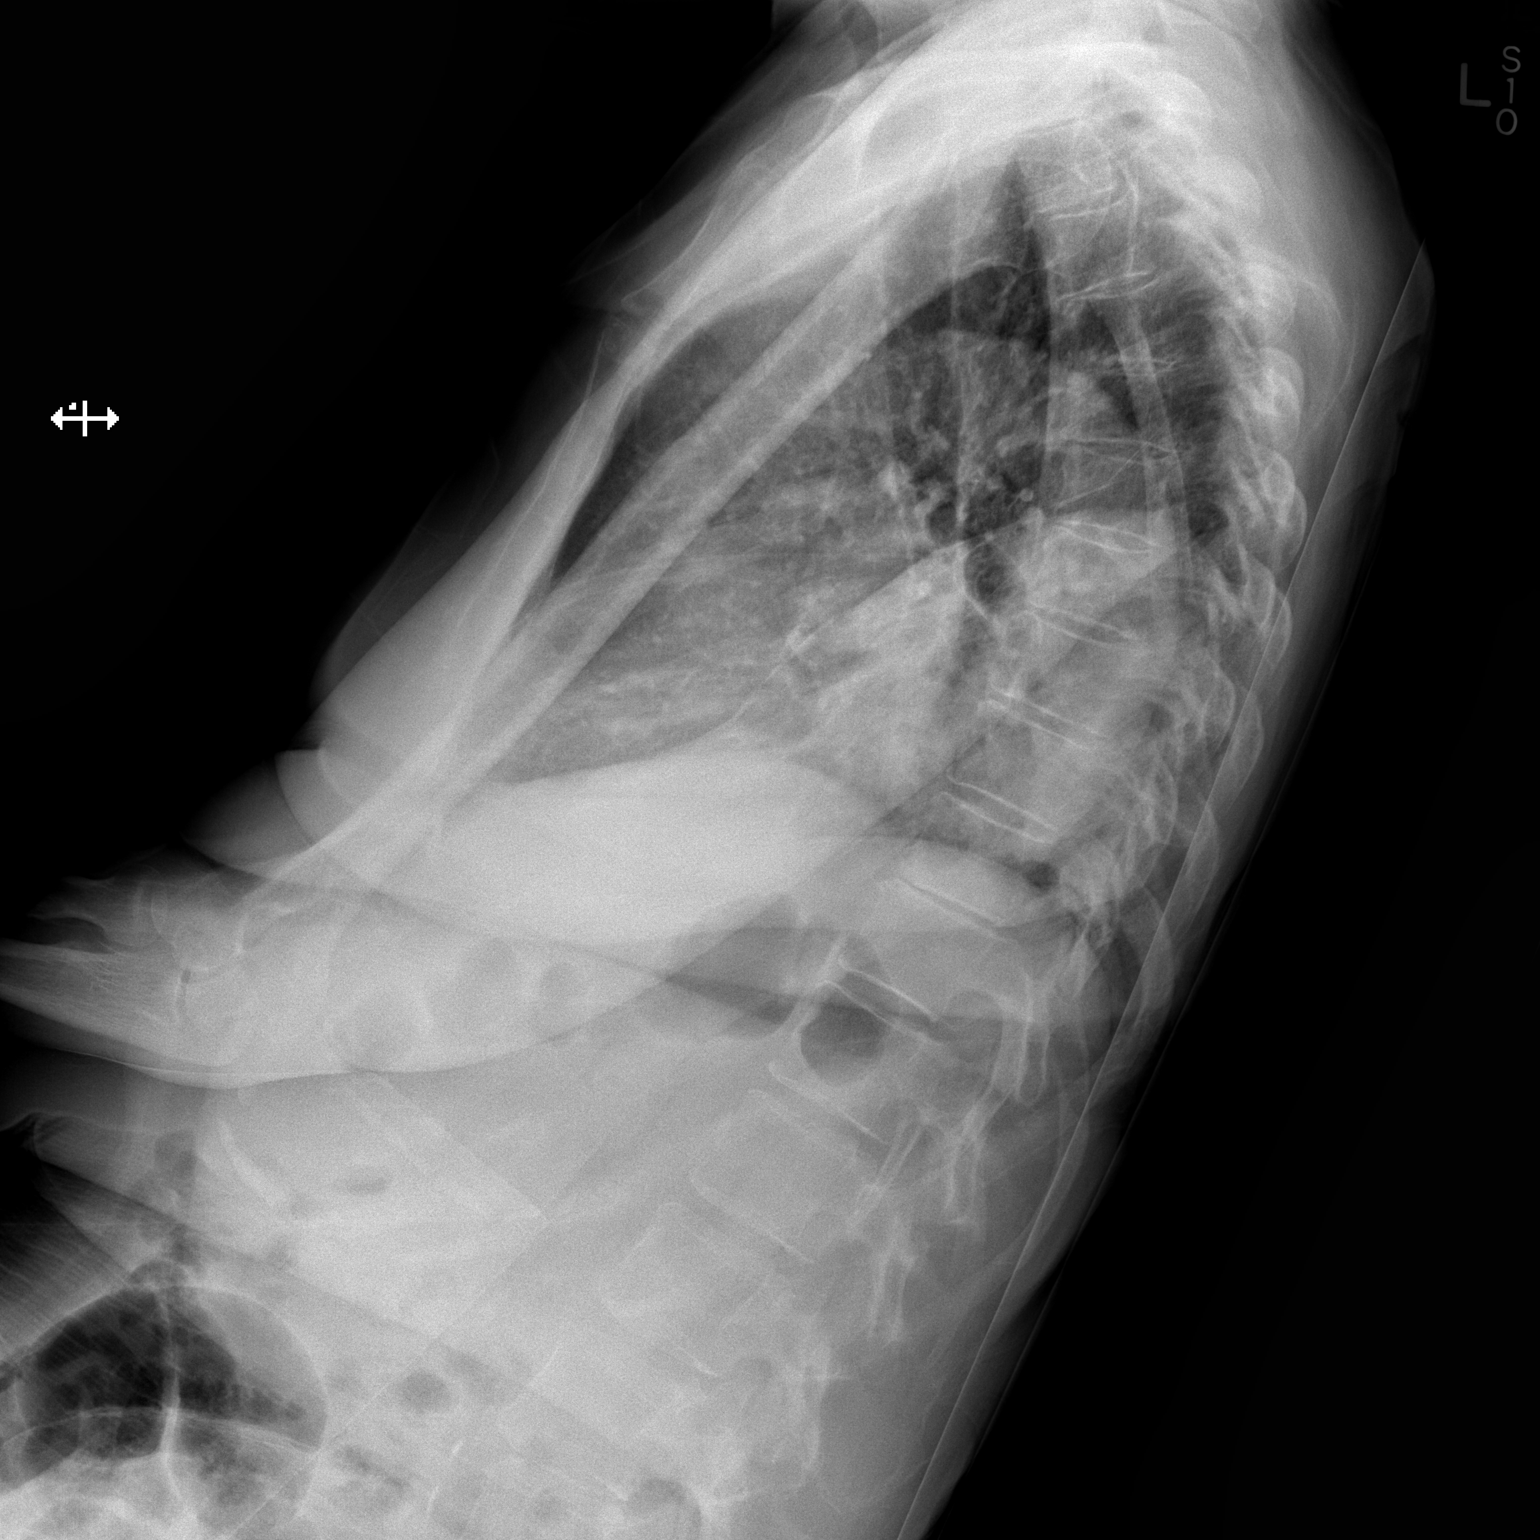

[x chest ap]
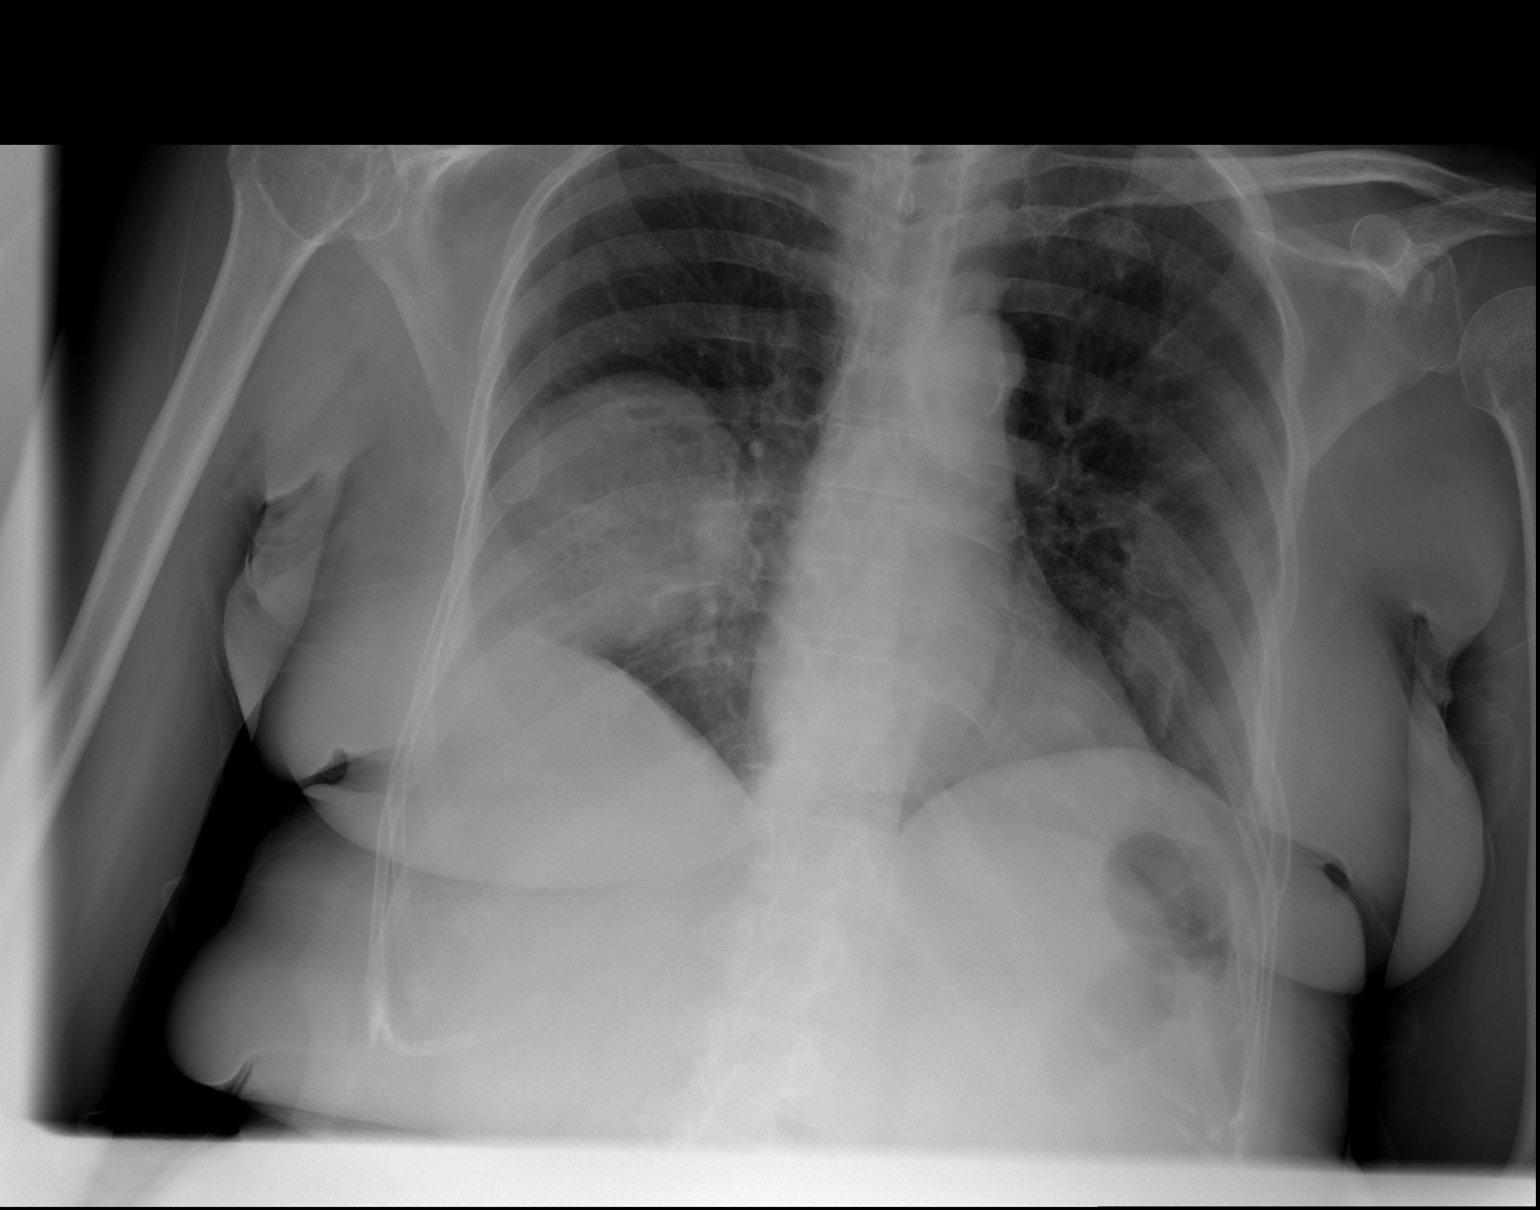

[2 of 2 positions shown; findings below may reference images not displayed]

FINDINGS: The heart size and mediastinal contours are within normal limits. No
pneumothorax or pleural effusion is noted. Lingular subsegmental
atelectasis or scarring is noted. Rounded abnormality is seen in the
right lower lobe consistent with probable pulmonary abscess noted on
prior study. It appears to have nearly completely filled with fluid
at this time. The visualized skeletal structures are unremarkable.
IMPRESSION: Continued presence of rounded abnormality seen in right lower lobe
most consistent with pulmonary abscess, as it has filled in with
fluid completely since prior exam. Neoplasm or malignancy cannot be
excluded.

## 2017-11-11 IMAGING — RF DG ESOPHAGUS
14 of 24 series · 14 of 24 positions shown · non-contrast
Comparison: CTs of the chest, abdomen, and pelvis dated 10/03/2015.

CLINICAL DATA: Nausea vomiting. Evaluate for recurrence of hiatal
hernia after anti reflux surgery.

EXAM:
ESOPHOGRAM/BARIUM SWALLOW
TECHNIQUE: Single contrast examination was performed using  thin barium.
FLUOROSCOPY TIME:  Radiation Exposure Index (as provided by the
fluoroscopic device): 5.1 mg Y
Number of Acquired Images:  None

[Series 1: run · 1 of 1 slices shown (1 of 14)]
[im 1/1]
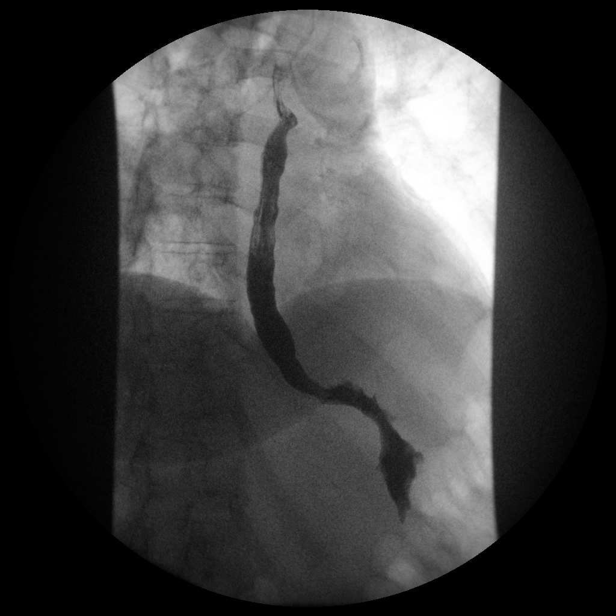

[Series 3: run · 1 of 1 slices shown (2 of 14)]
[im 1/1]
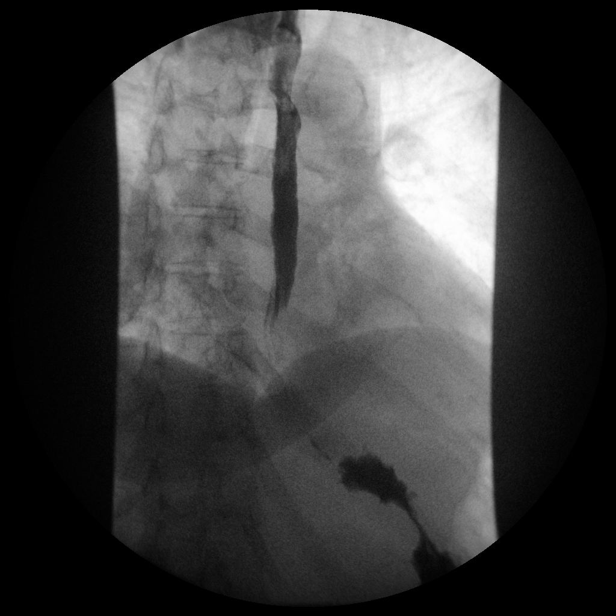

[Series 5: run · 1 of 1 slices shown (3 of 14)]
[im 1/1]
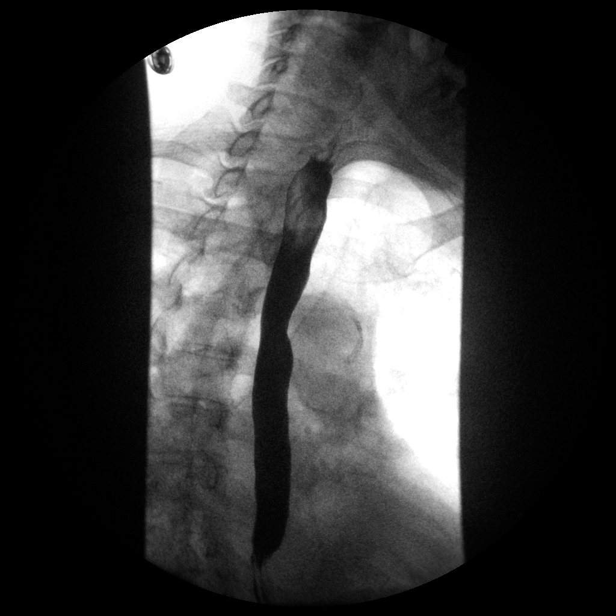

[Series 7: run · 1 of 1 slices shown (4 of 14)]
[im 1/1]
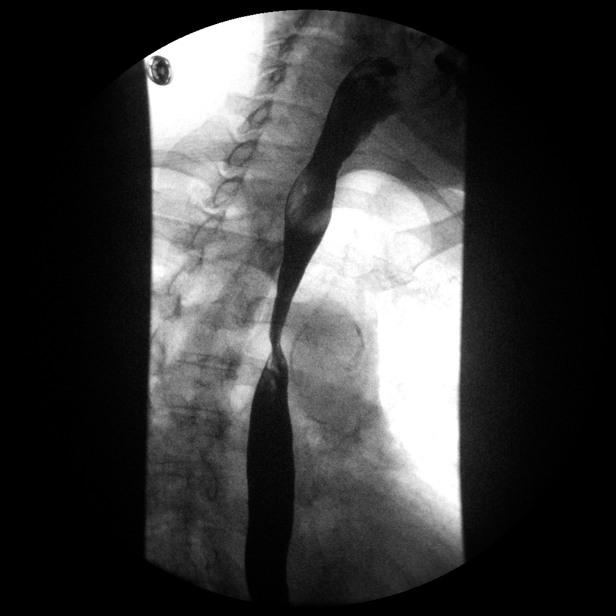

[Series 8: run · 1 of 1 slices shown (5 of 14)]
[im 1/1]
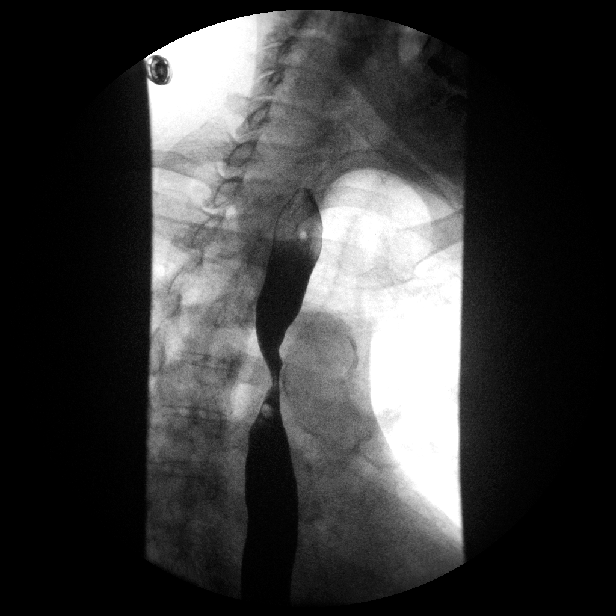

[Series 10: run · 1 of 1 slices shown (6 of 14)]
[im 1/1]
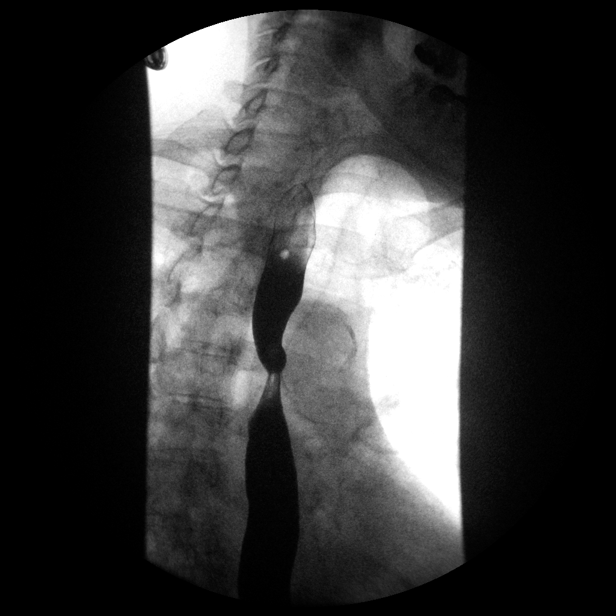

[Series 12: run · 1 of 1 slices shown (7 of 14)]
[im 1/1]
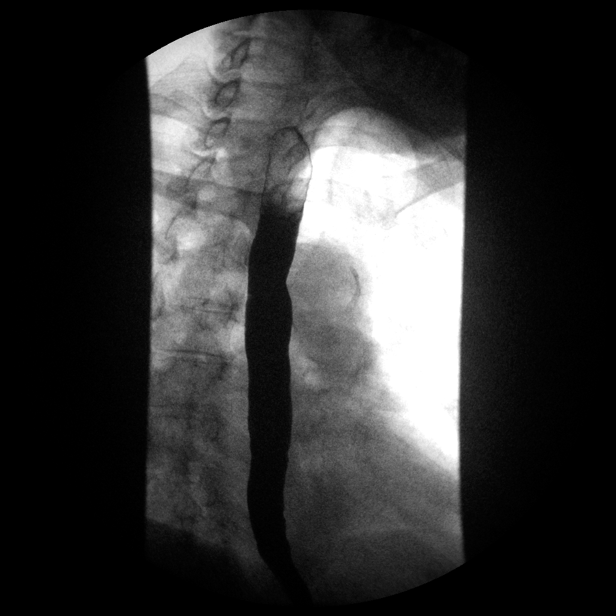

[Series 13: run · 1 of 1 slices shown (8 of 14)]
[im 1/1]
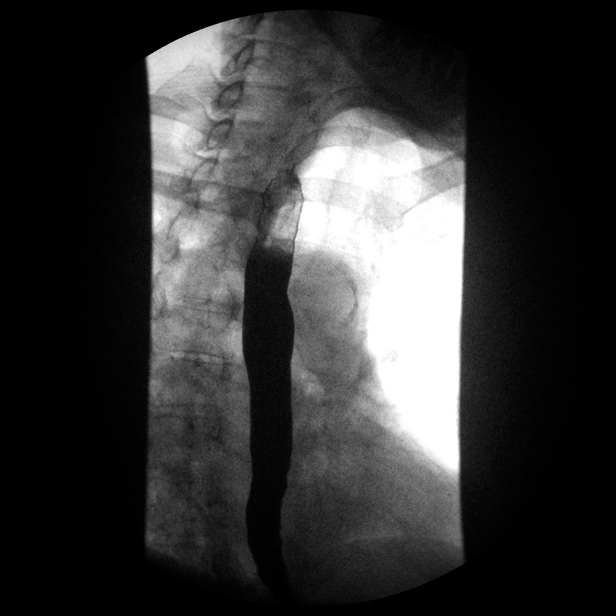

[Series 15: run · 1 of 1 slices shown (9 of 14)]
[im 1/1]
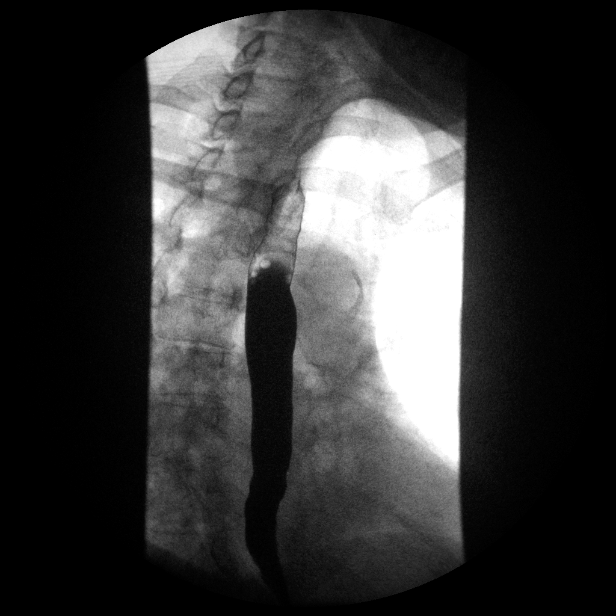

[Series 17: run · 1 of 1 slices shown (10 of 14)]
[im 1/1]
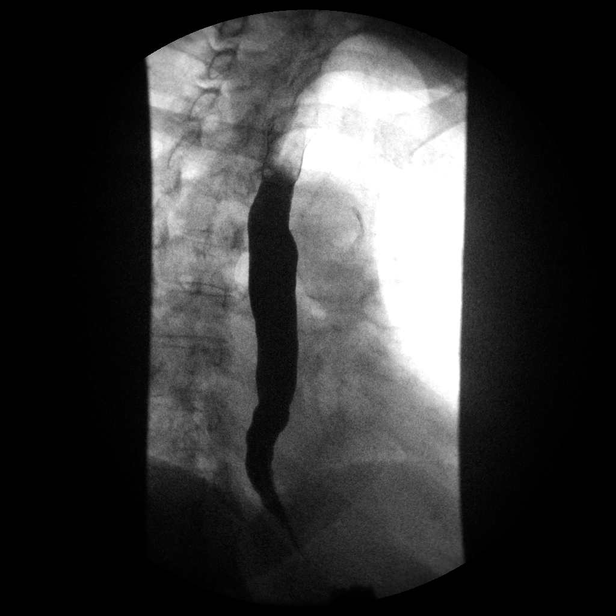

[Series 19: run · 1 of 1 slices shown (11 of 14)]
[im 1/1]
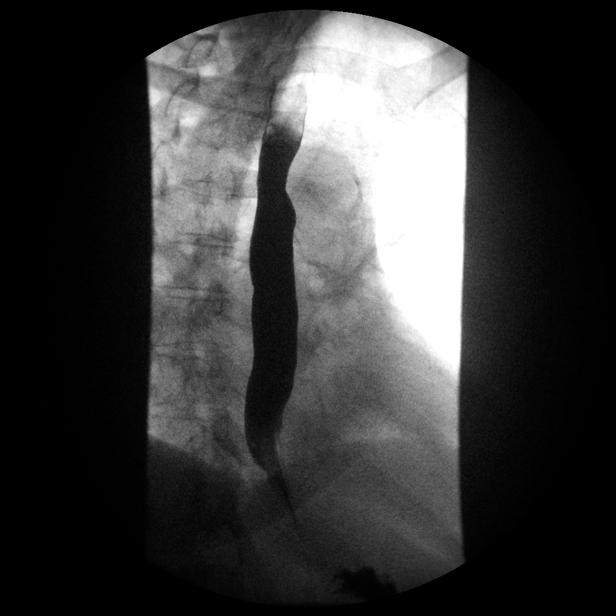

[Series 20: run · 1 of 1 slices shown (12 of 14)]
[im 1/1]
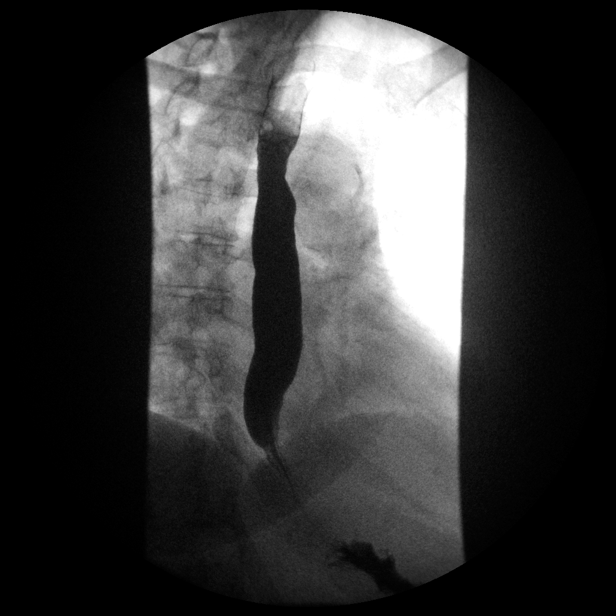

[Series 22: run · 1 of 1 slices shown (13 of 14)]
[im 1/1]
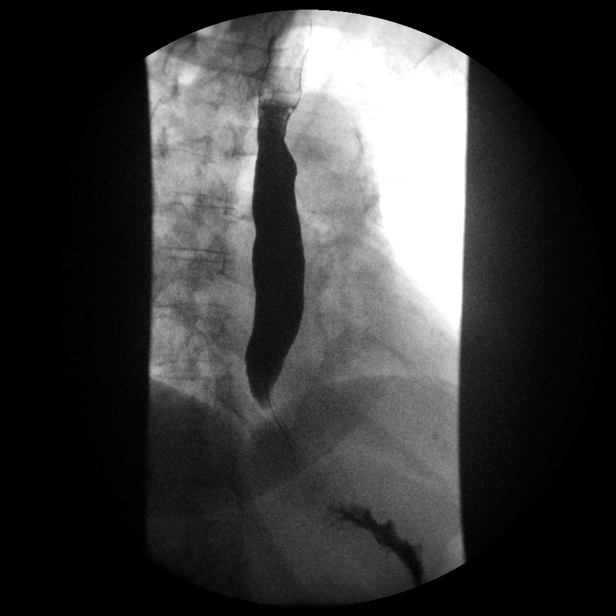

[Series 24: run · 1 of 1 slices shown (14 of 14)]
[im 1/1]
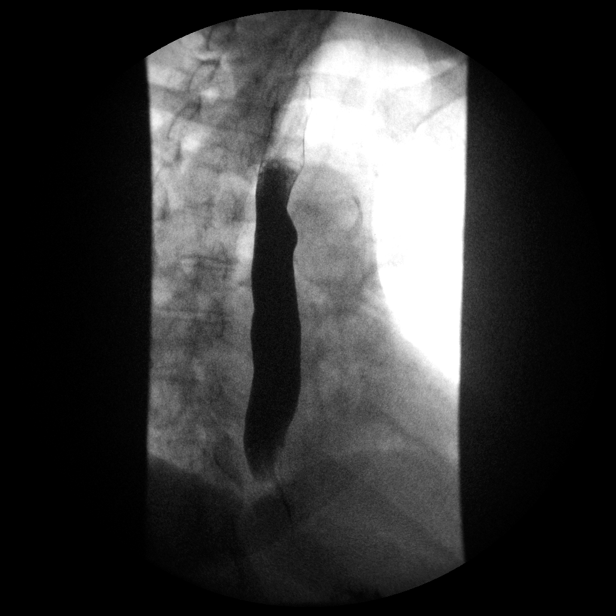

[14 of 24 positions shown; findings below may reference images not displayed]

FINDINGS: Secondary to the patient's clinical status, and the clinical
question, exam was performed in an LPO position with focused
single-contrast technique.

Evaluation of individual swallows demonstrates mild esophageal
dysmotility.

Full column evaluation of the esophagus demonstrates no persistent
narrowing or stricture. No hiatal hernia. Normal appearance of the
upper/proximal stomach.
IMPRESSION: 1. No evidence of hiatal hernia or other focal esophageal
abnormality.
2. Mild esophageal dysmotility, likely presbyesophagus.
